# Patient Record
Sex: Female | Born: 1940 | Race: Black or African American | Hispanic: No | Smoking: Former smoker
Health system: Southern US, Community
[De-identification: ages and names within clinical notes are randomized; demographics above are authoritative.]

## PROBLEM LIST (undated history)

## (undated) DIAGNOSIS — I509 Heart failure, unspecified: Secondary | ICD-10-CM

## (undated) DIAGNOSIS — D649 Anemia, unspecified: Secondary | ICD-10-CM

## (undated) DIAGNOSIS — I10 Essential (primary) hypertension: Secondary | ICD-10-CM

## (undated) DIAGNOSIS — N183 Chronic kidney disease, stage 3 unspecified: Secondary | ICD-10-CM

## (undated) DIAGNOSIS — E785 Hyperlipidemia, unspecified: Secondary | ICD-10-CM

## (undated) DIAGNOSIS — I5042 Chronic combined systolic (congestive) and diastolic (congestive) heart failure: Secondary | ICD-10-CM

---

## 2004-05-08 ENCOUNTER — Inpatient Hospital Stay (HOSPITAL_COMMUNITY): Admission: EM | Admit: 2004-05-08 | Discharge: 2004-05-17 | Payer: Self-pay | Admitting: Emergency Medicine

## 2019-02-12 ENCOUNTER — Encounter: Payer: Self-pay | Admitting: Emergency Medicine

## 2019-02-12 ENCOUNTER — Other Ambulatory Visit: Payer: Self-pay

## 2019-02-12 ENCOUNTER — Emergency Department
Admission: EM | Admit: 2019-02-12 | Discharge: 2019-02-12 | Disposition: A | Discharge status: Home / Self Care | Payer: Medicare Other | Attending: Emergency Medicine | Admitting: Emergency Medicine

## 2019-02-12 DIAGNOSIS — Z87891 Personal history of nicotine dependence: Secondary | ICD-10-CM | POA: Insufficient documentation

## 2019-02-12 DIAGNOSIS — I509 Heart failure, unspecified: Secondary | ICD-10-CM | POA: Diagnosis not present

## 2019-02-12 DIAGNOSIS — R220 Localized swelling, mass and lump, head: Secondary | ICD-10-CM | POA: Diagnosis not present

## 2019-02-12 DIAGNOSIS — K0889 Other specified disorders of teeth and supporting structures: Secondary | ICD-10-CM | POA: Diagnosis not present

## 2019-02-12 DIAGNOSIS — I110 Hypertensive heart disease with heart failure: Secondary | ICD-10-CM | POA: Insufficient documentation

## 2019-02-12 HISTORY — DX: Heart failure, unspecified: I50.9

## 2019-02-12 HISTORY — DX: Essential (primary) hypertension: I10

## 2019-02-12 MED ORDER — AMOXICILLIN 500 MG PO CAPS: 500.0000 mg | ORAL_CAPSULE | Freq: Three times a day (TID) | ORAL | 0 refills | Status: DC

## 2019-02-12 MED ORDER — TRAMADOL HCL 50 MG PO TABS
50.0000 mg | ORAL_TABLET | Freq: Once | ORAL | Status: AC
  Administered 2019-02-12: 50 mg via ORAL
  Filled 2019-02-12: qty 1

## 2019-02-12 MED ORDER — TRAMADOL HCL 50 MG PO TABS: 50.0000 mg | ORAL_TABLET | Freq: Four times a day (QID) | ORAL | 0 refills | Status: DC | PRN

## 2019-02-12 NOTE — ED Provider Notes (Signed)
[facility] Emergency Department Provider Note  ____________________________________________   First MD Initiated Contact with Patient 02/12/19 1701     (approximate)  I have reviewed the triage vital signs and the nursing notes.   HISTORY  Chief Complaint Dental Pain and Facial Swelling    HPI Zoe Boyle is a 79 y.o. female presents emergency department complaining of left-sided dental pain and some swelling to her face.  Patient states the pain started 2 days ago.  She has an appointment with her dentist next Monday.  She denies any fever or chills.  She denies chest pain or shortness of breath.  Similar symptoms previously.    Past Medical History:  Diagnosis Date  . Congestive heart failure (CHF) ([place])   . Hypertension     There are no problems to display for this patient.   History reviewed. No pertinent surgical history.  Prior to Admission medications   Medication Sig Start Date End Date Taking? Authorizing Provider  amoxicillin (AMOXIL) 500 MG capsule Take 1 capsule (500 mg total) by mouth 3 (three) times daily. 02/12/19   Areal Cochrane, Linden Dolin, PA-C  traMADol (ULTRAM) 50 MG tablet Take 1 tablet (50 mg total) by mouth every 6 (six) hours as needed. 02/12/19   Versie Starks, PA-C    Allergies Patient has no known allergies.  No family history on file.  Social History Social History   Tobacco Use  . Smoking status: Former Research scientist (life sciences)  . Smokeless tobacco: Never Used  Substance Use Topics  . Alcohol use: Not Currently  . Drug use: Not Currently    Review of Systems  Constitutional: No fever/chills Eyes: No visual changes. ENT: No sore throat.  Positive for dental pain Respiratory: Denies cough Cardiovascular: Denies chest pain Gastrointestinal: Denies abdominal pain Genitourinary: Negative for dysuria. Musculoskeletal: Negative for back pain. Skin: Negative for rash. Psychiatric: no mood changes,      ____________________________________________   PHYSICAL EXAM:  VITAL SIGNS: ED Triage Vitals [02/12/19 1619]  Enc Vitals Group     BP (!) 163/102     Pulse Rate 91     Resp 18     Temp 97.9 F (36.6 C)     Temp Source Oral     SpO2 95 %     Weight 140 lb (63.5 kg)     Height 5\' 4"  (1.626 m)     Head Circumference      Peak Flow      Pain Score 10     Pain Loc      Pain Edu?      Excl. in [place]?     Constitutional: Alert and oriented. Well appearing and in no acute distress. Eyes: Conjunctivae are normal.  Head: Atraumatic. Nose: No congestion/rhinnorhea. Mouth/Throat: Mucous membranes are moist.  Patient has a few teeth left on the left lower jawline, area is tender to palpation, small amount of swelling noted Neck:  supple no lymphadenopathy noted Cardiovascular: Normal rate, regular rhythm. Heart sounds are normal Respiratory: Normal respiratory effort.  No retractions, lungs c t a  GU: deferred Musculoskeletal: FROM all extremities, warm and well perfused Neurologic:  Normal speech and language.  Skin:  Skin is warm, dry and intact. No rash noted. Psychiatric: Mood and affect are normal. Speech and behavior are normal.  ____________________________________________   LABS (all labs ordered are listed, but only abnormal results are displayed)  Labs Reviewed - No data to display ____________________________________________   ____________________________________________  RADIOLOGY  ____________________________________________   PROCEDURES  Procedure(s) performed: No  Procedures    ____________________________________________   INITIAL IMPRESSION / ASSESSMENT AND PLAN / ED COURSE  Pertinent labs & imaging results that were available during my care of the patient were reviewed by me and considered in my medical decision making (see chart for details).   Patient 79 year old female presents emergency department dental pain.  See  HPI  Physical exam shows patient to appear well.  Vitals stable.  Left jaw was tender to palpation and a small amount of swelling is noted.  Remainder the exam is unremarkable  Explained findings to the patient.  Did discuss this with her daughter to ensure we were sending the medication to the right pharmacy.  Patient was given a prescription for amoxicillin and tramadol.  She is to use Tylenol and ibuprofen and if this does not help with pain then she can use tramadol.  She should follow-up with her dentist on Monday.  She is discharged stable condition.  Return if worsening.    Zoe Boyle was evaluated in Emergency Department on 02/12/2019 for the symptoms described in the history of present illness. She was evaluated in the context of the global COVID-19 pandemic, which necessitated consideration that the patient might be at risk for infection with the SARS-CoV-2 virus that causes COVID-19. Institutional protocols and algorithms that pertain to the evaluation of patients at risk for COVID-19 are in a state of rapid change based on information released by regulatory bodies including the CDC and federal and state organizations. These policies and algorithms were followed during the patient's care in the ED.   As part of my medical decision making, I reviewed the following data within the electronic MEDICAL RECORD NUMBER Nursing notes reviewed and incorporated, Old chart reviewed, Notes from prior ED visits and [place] Controlled Substance Database  ____________________________________________   FINAL CLINICAL IMPRESSION(S) / ED DIAGNOSES  Final diagnoses:  Pain, dental      NEW MEDICATIONS STARTED DURING THIS VISIT:  New Prescriptions   AMOXICILLIN (AMOXIL) 500 MG CAPSULE    Take 1 capsule (500 mg total) by mouth 3 (three) times daily.   TRAMADOL (ULTRAM) 50 MG TABLET    Take 1 tablet (50 mg total) by mouth every 6 (six) hours as needed.     Note:  This document was prepared using Dragon voice  recognition software and may include unintentional dictation errors.    Faythe Ghee, PA-C 02/12/19 1721    Sharyn Creamer, MD 02/12/19 2032

## 2019-02-12 NOTE — ED Notes (Signed)
Pt to room 53 c/o left facial pain and swelling. Swelling is not notable on exam. Pt has an appt on Monday with her dentist.

## 2019-02-12 NOTE — ED Triage Notes (Signed)
Pt presents to ED via POV with c/o L sided dental pain with some swelling noted to L side of her face.

## 2019-02-12 NOTE — Discharge Instructions (Addendum)
Follow-up with your regular doctor.   Follow-up with your regular dentist if pain is not better in 2 to 3 days.   Return emergency department if worsening.   Take the amoxicillin 3 times daily.   Tramadol for pain not controlled by Tylenol or ibuprofen.  Be careful with this medication as it may make you drowsy.

## 2019-02-23 ENCOUNTER — Encounter: Payer: Self-pay | Admitting: Emergency Medicine

## 2019-02-23 ENCOUNTER — Emergency Department: Payer: Medicare Other

## 2019-02-23 ENCOUNTER — Emergency Department
Admission: EM | Admit: 2019-02-23 | Discharge: 2019-02-23 | Disposition: A | Discharge status: Home / Self Care | Payer: Medicare Other | Attending: Emergency Medicine | Admitting: Emergency Medicine

## 2019-02-23 ENCOUNTER — Other Ambulatory Visit: Payer: Self-pay

## 2019-02-23 DIAGNOSIS — Z79899 Other long term (current) drug therapy: Secondary | ICD-10-CM | POA: Diagnosis not present

## 2019-02-23 DIAGNOSIS — I110 Hypertensive heart disease with heart failure: Secondary | ICD-10-CM | POA: Insufficient documentation

## 2019-02-23 DIAGNOSIS — Z20822 Contact with and (suspected) exposure to covid-19: Secondary | ICD-10-CM | POA: Diagnosis not present

## 2019-02-23 DIAGNOSIS — I509 Heart failure, unspecified: Secondary | ICD-10-CM | POA: Insufficient documentation

## 2019-02-23 DIAGNOSIS — Z87891 Personal history of nicotine dependence: Secondary | ICD-10-CM | POA: Insufficient documentation

## 2019-02-23 DIAGNOSIS — R438 Other disturbances of smell and taste: Secondary | ICD-10-CM | POA: Diagnosis present

## 2019-02-23 LAB — SARS CORONAVIRUS 2 (TAT 6-24 HRS): SARS Coronavirus 2: NEGATIVE

## 2019-02-23 NOTE — ED Triage Notes (Signed)
Presents with some SOB  Slight cough and decreased taste

## 2019-02-23 NOTE — ED Provider Notes (Signed)
[facility] Emergency Department Provider Note  ____________________________________________   First MD Initiated Contact with Patient 02/23/19 1055     (approximate)  I have reviewed the triage vital signs and the nursing notes.   HISTORY  Chief Complaint No chief complaint on file.    HPI Zoe Boyle is a 79 y.o. female presents emergency department with her daughter.  Patient presents  to the emergency department with her daughter.  Patient has had loss of taste for several days.  Daughter works at the post [employer] and has had Covid-like symptoms.  Patient denies fever or chills.  She denies chest pain or shortness of breath to me.  She states she has not had a cough when I asked her.  She does appear to be well.  Daughter is in the room to help answer questions to.   Past Medical History:  Diagnosis Date  . Congestive heart failure (CHF) ([place])   . Hypertension     There are no problems to display for this patient.   History reviewed. No pertinent surgical history.  Prior to Admission medications   Medication Sig Start Date End Date Taking? Authorizing Provider  furosemide (LASIX) 40 MG tablet Take 40 mg by mouth.   Yes [provider]  lisinopril (ZESTRIL) 40 MG tablet Take 40 mg by mouth daily.   Yes [provider]  pravastatin (PRAVACHOL) 40 MG tablet Take 40 mg by mouth daily.   Yes [provider]  amoxicillin (AMOXIL) 500 MG capsule Take 1 capsule (500 mg total) by mouth 3 (three) times daily. 02/12/19   Cythina Mickelsen, Linden Dolin, PA-C  traMADol (ULTRAM) 50 MG tablet Take 1 tablet (50 mg total) by mouth every 6 (six) hours as needed. 02/12/19   Versie Starks, PA-C    Allergies Patient has no known allergies.  No family history on file.  Social History Social History   Tobacco Use  . Smoking status: Former Research scientist (life sciences)  . Smokeless tobacco: Never Used  Substance Use Topics  . Alcohol use: Not Currently  . Drug use: Not  Currently    Review of Systems  Constitutional: No fever/chills Eyes: No visual changes. ENT: No sore throat.  Loss of taste Respiratory: Denies cough Cardiovascular: Denies chest pain Gastrointestinal: Denies abdominal pain Genitourinary: Negative for dysuria. Musculoskeletal: Negative for back pain. Skin: Negative for rash. Psychiatric: no mood changes,     ____________________________________________   PHYSICAL EXAM:  VITAL SIGNS: ED Triage Vitals [02/23/19 1101]  Enc Vitals Group     BP 129/89     Pulse Rate 72     Resp 18     Temp 98.3 F (36.8 C)     Temp Source Oral     SpO2 100 %     Weight      Height      Head Circumference      Peak Flow      Pain Score      Pain Loc      Pain Edu?      Excl. in [place]?     Constitutional: Alert and oriented. Well appearing and in no acute distress. Eyes: Conjunctivae are normal.  Head: Atraumatic. Nose: No congestion/rhinnorhea. Cardiovascular: Normal rate, regular rhythm. Heart sounds are normal Respiratory: Normal respiratory effort.  No retractions, lungs c t a  GU: deferred Musculoskeletal: FROM all extremities, warm and well perfused Neurologic:  Normal speech and language.  Skin:  Skin is warm, dry and intact. No  rash noted. Psychiatric: Mood and affect are normal. Speech and behavior are normal.  ____________________________________________   LABS (all labs ordered are listed, but only abnormal results are displayed)  Labs Reviewed  SARS CORONAVIRUS 2 (TAT 6-24 HRS)   ____________________________________________   ____________________________________________  RADIOLOGY  Chest x-ray is normal  ____________________________________________   PROCEDURES  Procedure(s) performed: No  Procedures    ____________________________________________   INITIAL IMPRESSION / ASSESSMENT AND PLAN / ED COURSE  Pertinent labs & imaging results that were available during my care of the patient were  reviewed by me and considered in my medical decision making (see chart for details).   The patient 79 year old female presents emergency department with decreased taste and cough with some shortness of breath.  See HPI  Physical exam patient appears to be well.  She is recently had 2 teeth pulled today.  Packing is in place.  Remainder exams are unremarkable  Chest x-ray is normal Covid test is pending  Explained findings to the daughter and the patient.  Covid test should result in 6- 24 hours.  If positive they should follow-up with her regular doctor as needed.  Return emergency department worsening.  She was discharged stable condition.    Zoe Boyle was evaluated in Emergency Department on 02/23/2019 for the symptoms described in the history of present illness. She was evaluated in the context of the global COVID-19 pandemic, which necessitated consideration that the patient might be at risk for infection with the SARS-CoV-2 virus that causes COVID-19. Institutional protocols and algorithms that pertain to the evaluation of patients at risk for COVID-19 are in a state of rapid change based on information released by regulatory bodies including the CDC and federal and state organizations. These policies and algorithms were followed during the patient's care in the ED.   As part of my medical decision making, I reviewed the following data within the electronic MEDICAL RECORD NUMBER History obtained from family, Nursing notes reviewed and incorporated, Old chart reviewed, Radiograph reviewed chest x-ray is normal, Notes from prior ED visits and [place] Controlled Substance Database  ____________________________________________   FINAL CLINICAL IMPRESSION(S) / ED DIAGNOSES  Final diagnoses:  Suspected COVID-19 virus infection      NEW MEDICATIONS STARTED DURING THIS VISIT:  New Prescriptions   No medications on file     Note:  This document was prepared using Dragon voice recognition  software and may include unintentional dictation errors.    Faythe Ghee, PA-C 02/23/19 1611    Shaune Pollack, MD 02/24/19 [phone]

## 2019-02-23 NOTE — Discharge Instructions (Addendum)
Follow-up with your regular doctor if not better in 3 days.  Return emergency department if worsening.  If you begin to have chest pain or shortness of breath please return immediately.

## 2019-10-18 ENCOUNTER — Emergency Department: Payer: Medicare Other

## 2019-10-18 ENCOUNTER — Emergency Department
Admission: EM | Admit: 2019-10-18 | Discharge: 2019-10-18 | Disposition: A | Discharge status: Home / Self Care | Payer: Medicare Other | Attending: Emergency Medicine | Admitting: Emergency Medicine

## 2019-10-18 ENCOUNTER — Encounter: Payer: Self-pay | Admitting: Emergency Medicine

## 2019-10-18 ENCOUNTER — Other Ambulatory Visit: Payer: Self-pay

## 2019-10-18 DIAGNOSIS — Z20822 Contact with and (suspected) exposure to covid-19: Secondary | ICD-10-CM | POA: Diagnosis not present

## 2019-10-18 DIAGNOSIS — I110 Hypertensive heart disease with heart failure: Secondary | ICD-10-CM | POA: Insufficient documentation

## 2019-10-18 DIAGNOSIS — Z79899 Other long term (current) drug therapy: Secondary | ICD-10-CM | POA: Insufficient documentation

## 2019-10-18 DIAGNOSIS — R0602 Shortness of breath: Secondary | ICD-10-CM | POA: Diagnosis not present

## 2019-10-18 DIAGNOSIS — I5033 Acute on chronic diastolic (congestive) heart failure: Secondary | ICD-10-CM | POA: Diagnosis not present

## 2019-10-18 DIAGNOSIS — Z87891 Personal history of nicotine dependence: Secondary | ICD-10-CM | POA: Insufficient documentation

## 2019-10-18 DIAGNOSIS — R0601 Orthopnea: Secondary | ICD-10-CM | POA: Diagnosis not present

## 2019-10-18 LAB — BASIC METABOLIC PANEL
Anion gap: 12 (ref 5–15)
BUN: 13 mg/dL (ref 8–23)
CO2: 23 mmol/L (ref 22–32)
Calcium: 9 mg/dL (ref 8.9–10.3)
Chloride: 107 mmol/L (ref 98–111)
Creatinine, Ser: 0.97 mg/dL (ref 0.44–1.00)
GFR calc Af Amer: >60 mL/min (ref >60)
GFR calc non Af Amer: 56 mL/min — ABNORMAL LOW (ref >60)
Glucose, Bld: 126 mg/dL — ABNORMAL HIGH (ref 70–99)
Potassium: 2.9 mmol/L — ABNORMAL LOW (ref 3.5–5.1)
Sodium: 142 mmol/L (ref 135–145)

## 2019-10-18 LAB — TROPONIN I (HIGH SENSITIVITY)
Troponin I (High Sensitivity): 20 ng/L — ABNORMAL HIGH (ref <18)
Troponin I (High Sensitivity): 24 ng/L — ABNORMAL HIGH (ref <18)

## 2019-10-18 LAB — CBC
HCT: 32.3 % — ABNORMAL LOW (ref 36.0–46.0)
Hemoglobin: 10.5 g/dL — ABNORMAL LOW (ref 12.0–15.0)
MCH: 27.6 pg (ref 26.0–34.0)
MCHC: 32.5 g/dL (ref 30.0–36.0)
MCV: 84.8 fL (ref 80.0–100.0)
Platelets: 181 10*3/uL (ref 150–400)
RBC: 3.81 MIL/uL — ABNORMAL LOW (ref 3.87–5.11)
RDW: 14.7 % (ref 11.5–15.5)
WBC: 4.9 10*3/uL (ref 4.0–10.5)
nRBC: 0 % (ref 0.0–0.2)

## 2019-10-18 LAB — BRAIN NATRIURETIC PEPTIDE: B Natriuretic Peptide: 734 pg/mL — ABNORMAL HIGH (ref 0.0–100.0)

## 2019-10-18 LAB — SARS CORONAVIRUS 2 BY RT PCR (HOSPITAL ORDER, PERFORMED IN ~~LOC~~ HOSPITAL LAB): SARS Coronavirus 2: NEGATIVE

## 2019-10-18 MED ORDER — FUROSEMIDE 10 MG/ML IJ SOLN
40.0000 mg | Freq: Once | INTRAMUSCULAR | Status: AC
  Administered 2019-10-18: 40 mg via INTRAVENOUS
  Filled 2019-10-18: qty 4

## 2019-10-18 MED ORDER — POTASSIUM CHLORIDE 20 MEQ PO PACK
40.0000 meq | PACK | Freq: Once | ORAL | Status: AC
  Administered 2019-10-18: 40 meq via ORAL
  Filled 2019-10-18: qty 2

## 2019-10-18 NOTE — ED Triage Notes (Signed)
EKG from 2016 at [facility] read LBBB

## 2019-10-18 NOTE — ED Provider Notes (Signed)
[facility] Emergency Department Provider Note ____________________________________________   First MD Initiated Contact with Patient 10/18/19 1928     (approximate)  I have reviewed the triage vital signs and the nursing notes.  HISTORY  Chief Complaint Shortness of Breath   HPI Zoe Boyle is a 79 y.o. femalewho presents to the ED for evaluation of shortness of breath.   Chart review indicates hx CHF and HTN.  Patient prescribed Lasix 40 mg daily.  Patient lives at home with daughter, who helps manage her medications.  Patient presents to the ED with her daughter due to concerns for 1-2 days of shortness of breath. Daughter reports they had run out of patient's Lasix and potassium supplementation last week, and patient went about 5 days without his medications.  Daughter reports refilling his medications over this weekend and have them of the past 2 days, and dosing them as prescribed.  Daughter reports concomitant concern for shortness of breath, and "just wanted her to get checked out."  Patient is not vaccinated for COVID-19, and daughter reports concern for this.  Further reports concern for pneumonia or other causes of shortness of breath.  Patient has no complaints.  She reports feeling well and has no chest pain.  She reports orthopnea "sometimes" and reports that it may be a little bit worse these past few days compared to normal.  Patient denies increased cough, productive cough, fevers, syncope or pain.  Past Medical History:  Diagnosis Date  . Congestive heart failure (CHF) ([facility])   . Hypertension     There are no problems to display for this patient.   History reviewed. No pertinent surgical history.  Prior to Admission medications   Medication Sig Start Date End Date Taking? Authorizing Provider  amoxicillin (AMOXIL) 500 MG capsule Take 1 capsule (500 mg total) by mouth 3 (three) times daily. 02/12/19   Fisher, Roselyn Bering, PA-C  furosemide  (LASIX) 40 MG tablet Take 40 mg by mouth.    [provider]  lisinopril (ZESTRIL) 40 MG tablet Take 40 mg by mouth daily.    [provider]  pravastatin (PRAVACHOL) 40 MG tablet Take 40 mg by mouth daily.    [provider]  traMADol (ULTRAM) 50 MG tablet Take 1 tablet (50 mg total) by mouth every 6 (six) hours as needed. 02/12/19   Faythe Ghee, PA-C    Allergies Patient has no known allergies.  History reviewed. No pertinent family history.  Social History Social History   Tobacco Use  . Smoking status: Former Games developer  . Smokeless tobacco: Never Used  Substance Use Topics  . Alcohol use: Not Currently  . Drug use: Not Currently    Review of Systems  Constitutional: No fever/chills Eyes: No visual changes. ENT: No sore throat. Cardiovascular: Denies chest pain. Respiratory: Positive shortness of breath. Gastrointestinal: No abdominal pain.  No nausea, no vomiting.  No diarrhea.  No constipation. Genitourinary: Negative for dysuria. Musculoskeletal: Negative for back pain. Skin: Negative for rash. Neurological: Negative for headaches, focal weakness or numbness.   ____________________________________________   PHYSICAL EXAM:  VITAL SIGNS: Vitals:   10/18/19 2015 10/18/19 2030  BP:  (!) 135/106  Pulse:  88  Resp: 20   Temp:    SpO2: 93%       Constitutional: Alert and oriented. Well appearing and in no acute distress. Eyes: Conjunctivae are normal. PERRL. EOMI. Head: Atraumatic. Nose: No congestion/rhinnorhea. Mouth/Throat: Mucous membranes are moist.  Oropharynx non-erythematous. Neck: No  stridor. No cervical spine tenderness to palpation. Cardiovascular: Normal rate, regular rhythm. Grossly normal heart sounds.  Good peripheral circulation. Respiratory: Normal respiratory effort.  No retractions. Lungs CTAB. Gastrointestinal: Soft , nondistended, nontender to palpation. No abdominal bruits. No CVA tenderness. Musculoskeletal:  No lower extremity tenderness nor edema.  No joint effusions. No signs of acute trauma. Neurologic:  Normal speech and language. No gross focal neurologic deficits are appreciated. No gait instability noted. Skin:  Skin is warm, dry and intact. No rash noted. Psychiatric: Mood and affect are normal. Speech and behavior are normal.  ____________________________________________   LABS (all labs ordered are listed, but only abnormal results are displayed)  Labs Reviewed  BASIC METABOLIC PANEL - Abnormal; Notable for the following components:      Result Value   Potassium 2.9 (*)    Glucose, Bld 126 (*)    GFR calc non Af Amer 56 (*)    All other components within normal limits  CBC - Abnormal; Notable for the following components:   RBC 3.81 (*)    Hemoglobin 10.5 (*)    HCT 32.3 (*)    All other components within normal limits  BRAIN NATRIURETIC PEPTIDE - Abnormal; Notable for the following components:   B Natriuretic Peptide 734.0 (*)    All other components within normal limits  TROPONIN I (HIGH SENSITIVITY) - Abnormal; Notable for the following components:   Troponin I (High Sensitivity) 20 (*)    All other components within normal limits  TROPONIN I (HIGH SENSITIVITY) - Abnormal; Notable for the following components:   Troponin I (High Sensitivity) 24 (*)    All other components within normal limits  SARS CORONAVIRUS 2 BY RT PCR (HOSPITAL ORDER, PERFORMED IN [facility])   ____________________________________________  12 Lead EKG  Sinus rhythm, rate of 81 bpm, left axis.  Left bundle branch block at baseline.  No evidence of ischemia, per Sgarbossa criteria ____________________________________________  RADIOLOGY  ED MD interpretation: 2 view CXR reviewed with evidence of mild volume overload.  No discrete filtration or diffuse infiltrates.  Official radiology report(s): DG Chest 2 View  Result Date: 10/18/2019 CLINICAL DATA:  Worsening dyspnea for 4 days,  CHF EXAM: CHEST - 2 VIEW COMPARISON:  02/23/2019 chest radiograph. FINDINGS: Stable cardiomediastinal silhouette with moderate cardiomegaly. No pneumothorax. Trace bilateral pleural effusions. Mild pulmonary edema. IMPRESSION: Mild congestive heart failure with trace bilateral pleural effusions. Electronically Signed   By: Delbert Phenix M.D.   On: 10/18/2019 13:58    ____________________________________________   PROCEDURES and INTERVENTIONS  Procedure(s) performed (including Critical Care):  Procedures  Medications  potassium chloride (KLOR-CON) packet 40 mEq (40 mEq Oral Given 10/18/19 2012)  furosemide (LASIX) injection 40 mg (40 mg Intravenous Given 10/18/19 2012)    ____________________________________________   MDM / ED COURSE  79 year old woman with known CHF presenting with evidence of mild CHF exacerbation after accidentally running out of medications at home for few days, amenable to outpatient management.  Normal vital signs on room air.  Exam is reassuring without evidence of gross volume overload.  No evidence of distress, trauma or neurovascular deficits.  Patient really looks well and she has no complaints.  She is sitting upright in bed, and vaguely reports "sometimes" orthopnea.  Her work-up is most consistent with volume overload and mild CHF exacerbation with elevated BNP and congested CXR.  EKG is nonischemic and troponin is negative.  Blood work further demonstrates hypokalemia, likely due to her lack of potassium supplementation as  well.  Potassium was repleted orally and she was provided a single dose of IV Lasix with good subsequent urinary output.  She reports subsequent improved orthopnea and symptoms.  Patient looks well and has no indications for hospitalization.  Daughter reports now having the appropriate medications at home with her Lasix and potassium supplementation, and agrees to continue these medications as prescribed starting tomorrow.  I believe her single  dose of Lasix IV here helped bring her back closer to euvolemia.  We discussed return precautions for the ED and following up with PCP and cardiology.  Patient medically stable for discharge home.  Clinical Course as of Oct 17 2153  Wynelle Link Oct 18, 2019  2151 Reassessed.  Patient reports feeling well.  Denies shortness of breath or orthopnea now.  Educated patient and daughter at the bedside about signs of CHF exacerbation, likely accidental due to her lack of medications for a few days.  We discussed outpatient measures of her continued potassium supplementation and Lasix usage at home.  Answered questions.   [DS]    Clinical Course User Index [DS] Delton Prairie, MD     ____________________________________________   FINAL CLINICAL IMPRESSION(S) / ED DIAGNOSES  Final diagnoses:  SOB (shortness of breath)  Orthopnea  Acute on chronic diastolic congestive heart failure [facility])     ED Discharge Orders    None       Pailyn [place]   Note:  This document was prepared using Dragon voice recognition software and may include unintentional dictation errors.   Delton Prairie, MD 10/18/19 2158

## 2019-10-18 NOTE — ED Notes (Signed)
Pt ambulatory to toilet

## 2019-10-18 NOTE — Discharge Instructions (Signed)
You were seen in the ED because of Zoe Boyle's shortness of breath at home.  She has evidence of a mild heart failure exacerbation, likely due to her going a few days without medications.  Thankfully, there is no evidence of Covid, pneumonia or damage/strain to her heart.  It is safe to go home and continue her typical medication regimen.  Please continue to give her potassium supplementation and furosemide fluid pill as prescribed.  Follow-up with her cardiologist and PCP as scheduled.  If she develops any further worsening shortness of breath, please return to the ED.

## 2019-10-18 NOTE — ED Triage Notes (Signed)
Pt here for [facility] per daughter.  Daughter feels breathing has been heavier past couple days. Pt unsure about weight gain, does not weigh self.  Unlabored at this time, VSS.  No fever or cough.

## 2019-12-30 ENCOUNTER — Encounter: Payer: Self-pay | Admitting: Emergency Medicine

## 2019-12-30 ENCOUNTER — Other Ambulatory Visit: Payer: Self-pay

## 2019-12-30 ENCOUNTER — Emergency Department: Payer: Medicare Other

## 2019-12-30 DIAGNOSIS — I509 Heart failure, unspecified: Secondary | ICD-10-CM | POA: Insufficient documentation

## 2019-12-30 DIAGNOSIS — Z5321 Procedure and treatment not carried out due to patient leaving prior to being seen by health care provider: Secondary | ICD-10-CM | POA: Insufficient documentation

## 2019-12-30 DIAGNOSIS — J189 Pneumonia, unspecified organism: Secondary | ICD-10-CM | POA: Diagnosis not present

## 2019-12-30 DIAGNOSIS — I110 Hypertensive heart disease with heart failure: Secondary | ICD-10-CM | POA: Insufficient documentation

## 2019-12-30 DIAGNOSIS — R059 Cough, unspecified: Secondary | ICD-10-CM | POA: Insufficient documentation

## 2019-12-30 DIAGNOSIS — R0602 Shortness of breath: Secondary | ICD-10-CM | POA: Insufficient documentation

## 2019-12-30 DIAGNOSIS — K0889 Other specified disorders of teeth and supporting structures: Secondary | ICD-10-CM | POA: Insufficient documentation

## 2019-12-30 DIAGNOSIS — R197 Diarrhea, unspecified: Secondary | ICD-10-CM | POA: Insufficient documentation

## 2019-12-30 LAB — CBC
HCT: 28.2 % — ABNORMAL LOW (ref 36.0–46.0)
Hemoglobin: 9.3 g/dL — ABNORMAL LOW (ref 12.0–15.0)
MCH: 26 pg (ref 26.0–34.0)
MCHC: 33 g/dL (ref 30.0–36.0)
MCV: 78.8 fL — ABNORMAL LOW (ref 80.0–100.0)
Platelets: 212 10*3/uL (ref 150–400)
RBC: 3.58 MIL/uL — ABNORMAL LOW (ref 3.87–5.11)
RDW: 13.8 % (ref 11.5–15.5)
WBC: 11.7 10*3/uL — ABNORMAL HIGH (ref 4.0–10.5)
nRBC: 0 % (ref 0.0–0.2)

## 2019-12-30 LAB — BASIC METABOLIC PANEL
Anion gap: 14 (ref 5–15)
BUN: 43 mg/dL — ABNORMAL HIGH (ref 8–23)
CO2: 22 mmol/L (ref 22–32)
Calcium: 9.5 mg/dL (ref 8.9–10.3)
Chloride: 102 mmol/L (ref 98–111)
Creatinine, Ser: 2.63 mg/dL — ABNORMAL HIGH (ref 0.44–1.00)
GFR, Estimated: 18 mL/min — ABNORMAL LOW (ref >60)
Glucose, Bld: 146 mg/dL — ABNORMAL HIGH (ref 70–99)
Potassium: 3.6 mmol/L (ref 3.5–5.1)
Sodium: 138 mmol/L (ref 135–145)

## 2019-12-30 LAB — TROPONIN I (HIGH SENSITIVITY): Troponin I (High Sensitivity): 47 ng/L — ABNORMAL HIGH (ref <18)

## 2019-12-30 NOTE — ED Triage Notes (Signed)
Pt to ED from home c/o SOB, productive yellow cough, and dental pain.  Cough approx 1 week.  Pt also c/o dental pain causing her to not speak or eat at home.  Pt daughter with pt answering questions.  Daughter states noticed diarrhea at home today.  Hx of HTN and CHF.  Pt A&O, skin WNL, chest rise even and unlabored, in NAD at this time.

## 2019-12-31 ENCOUNTER — Emergency Department: Payer: Medicare Other

## 2019-12-31 ENCOUNTER — Inpatient Hospital Stay
Admission: EM | Admit: 2019-12-31 | Discharge: 2020-01-02 | DRG: 193 | Disposition: A | Discharge status: Home / Self Care | Payer: Medicare Other | Attending: Internal Medicine | Admitting: Internal Medicine

## 2019-12-31 ENCOUNTER — Emergency Department
Admission: EM | Admit: 2019-12-31 | Discharge: 2019-12-31 | Disposition: A | Discharge status: Home / Self Care | Payer: Medicare Other | Source: Home / Self Care

## 2019-12-31 ENCOUNTER — Other Ambulatory Visit: Payer: Self-pay

## 2019-12-31 DIAGNOSIS — N179 Acute kidney failure, unspecified: Secondary | ICD-10-CM | POA: Diagnosis present

## 2019-12-31 DIAGNOSIS — K029 Dental caries, unspecified: Secondary | ICD-10-CM | POA: Diagnosis present

## 2019-12-31 DIAGNOSIS — Z888 Allergy status to other drugs, medicaments and biological substances status: Secondary | ICD-10-CM

## 2019-12-31 DIAGNOSIS — I700 Atherosclerosis of aorta: Secondary | ICD-10-CM | POA: Diagnosis present

## 2019-12-31 DIAGNOSIS — Z6820 Body mass index (BMI) 20.0-20.9, adult: Secondary | ICD-10-CM | POA: Diagnosis not present

## 2019-12-31 DIAGNOSIS — E440 Moderate protein-calorie malnutrition: Secondary | ICD-10-CM | POA: Insufficient documentation

## 2019-12-31 DIAGNOSIS — R0602 Shortness of breath: Secondary | ICD-10-CM | POA: Diagnosis present

## 2019-12-31 DIAGNOSIS — K0889 Other specified disorders of teeth and supporting structures: Secondary | ICD-10-CM | POA: Diagnosis present

## 2019-12-31 DIAGNOSIS — Z20822 Contact with and (suspected) exposure to covid-19: Secondary | ICD-10-CM | POA: Diagnosis present

## 2019-12-31 DIAGNOSIS — I110 Hypertensive heart disease with heart failure: Secondary | ICD-10-CM | POA: Diagnosis present

## 2019-12-31 DIAGNOSIS — I447 Left bundle-branch block, unspecified: Secondary | ICD-10-CM | POA: Diagnosis present

## 2019-12-31 DIAGNOSIS — E43 Unspecified severe protein-calorie malnutrition: Secondary | ICD-10-CM | POA: Insufficient documentation

## 2019-12-31 DIAGNOSIS — J189 Pneumonia, unspecified organism: Principal | ICD-10-CM | POA: Diagnosis present

## 2019-12-31 DIAGNOSIS — Z79899 Other long term (current) drug therapy: Secondary | ICD-10-CM | POA: Diagnosis not present

## 2019-12-31 DIAGNOSIS — I5022 Chronic systolic (congestive) heart failure: Secondary | ICD-10-CM | POA: Diagnosis present

## 2019-12-31 DIAGNOSIS — Z87891 Personal history of nicotine dependence: Secondary | ICD-10-CM

## 2019-12-31 LAB — RESP PANEL BY RT-PCR (FLU A&B, COVID) ARPGX2
Influenza A by PCR: NEGATIVE
Influenza B by PCR: NEGATIVE
SARS Coronavirus 2 by RT PCR: NEGATIVE

## 2019-12-31 LAB — BASIC METABOLIC PANEL
Anion gap: 15 (ref 5–15)
BUN: 47 mg/dL — ABNORMAL HIGH (ref 8–23)
CO2: 23 mmol/L (ref 22–32)
Calcium: 9.9 mg/dL (ref 8.9–10.3)
Chloride: 101 mmol/L (ref 98–111)
Creatinine, Ser: 1.92 mg/dL — ABNORMAL HIGH (ref 0.44–1.00)
GFR, Estimated: 27 mL/min — ABNORMAL LOW (ref >60)
Glucose, Bld: 127 mg/dL — ABNORMAL HIGH (ref 70–99)
Potassium: 3.9 mmol/L (ref 3.5–5.1)
Sodium: 139 mmol/L (ref 135–145)

## 2019-12-31 LAB — CBC
HCT: 27.5 % — ABNORMAL LOW (ref 36.0–46.0)
Hemoglobin: 9.4 g/dL — ABNORMAL LOW (ref 12.0–15.0)
MCH: 26.6 pg (ref 26.0–34.0)
MCHC: 34.2 g/dL (ref 30.0–36.0)
MCV: 77.9 fL — ABNORMAL LOW (ref 80.0–100.0)
Platelets: 230 10*3/uL (ref 150–400)
RBC: 3.53 MIL/uL — ABNORMAL LOW (ref 3.87–5.11)
RDW: 14.1 % (ref 11.5–15.5)
WBC: 12.1 10*3/uL — ABNORMAL HIGH (ref 4.0–10.5)
nRBC: 0 % (ref 0.0–0.2)

## 2019-12-31 LAB — LACTIC ACID, PLASMA: Lactic Acid, Venous: 1.2 mmol/L (ref 0.5–1.9)

## 2019-12-31 MED ORDER — TRAMADOL HCL 50 MG PO TABS: 50.0000 mg | ORAL_TABLET | Freq: Four times a day (QID) | ORAL | Status: DC | PRN

## 2019-12-31 MED ORDER — ACETAMINOPHEN 160 MG/5ML PO SOLN
650.0000 mg | Freq: Four times a day (QID) | ORAL | Status: DC | PRN
  Filled 2019-12-31 (×2): qty 20.3

## 2019-12-31 MED ORDER — PRAVASTATIN SODIUM 20 MG PO TABS
40.0000 mg | ORAL_TABLET | Freq: Every day | ORAL | Status: DC
  Administered 2020-01-01 – 2020-01-02 (×2): 40 mg via ORAL
  Filled 2019-12-31: qty 1
  Filled 2019-12-31 (×2): qty 2
  Filled 2019-12-31: qty 1

## 2019-12-31 MED ORDER — SODIUM CHLORIDE 0.9 % IV SOLN
500.0000 mg | INTRAVENOUS | Status: DC
  Administered 2020-01-01: 500 mg via INTRAVENOUS
  Filled 2019-12-31 (×2): qty 500

## 2019-12-31 MED ORDER — SODIUM CHLORIDE 0.9 % IV SOLN: INTRAVENOUS | Status: DC

## 2019-12-31 MED ORDER — SODIUM CHLORIDE 0.9 % IV SOLN
500.0000 mg | INTRAVENOUS | Status: DC
  Administered 2019-12-31: 500 mg via INTRAVENOUS
  Filled 2019-12-31 (×2): qty 500

## 2019-12-31 MED ORDER — SODIUM CHLORIDE 0.9 % IV SOLN
2.0000 g | INTRAVENOUS | Status: DC
  Filled 2019-12-31: qty 20

## 2019-12-31 MED ORDER — SODIUM CHLORIDE 0.9 % IV SOLN
2.0000 g | INTRAVENOUS | Status: DC
  Administered 2019-12-31: 2 g via INTRAVENOUS
  Filled 2019-12-31: qty 20

## 2019-12-31 MED ORDER — ENOXAPARIN SODIUM 30 MG/0.3ML ~~LOC~~ SOLN
30.0000 mg | SUBCUTANEOUS | Status: DC
  Administered 2020-01-01 (×2): 30 mg via SUBCUTANEOUS
  Filled 2019-12-31 (×2): qty 0.3

## 2019-12-31 MED ORDER — SODIUM CHLORIDE 0.9 % IV SOLN: Freq: Once | INTRAVENOUS | Status: AC

## 2019-12-31 NOTE — ED Triage Notes (Addendum)
Pt comes via POV from home with c/o 2 week dental pain. Pt has bad tooth. Pt's family states possible abscess.  Pt also has bad cold. Pt states productive green phelgm.  Pt states increased SOB.

## 2019-12-31 NOTE — H&P (Signed)
History and Physical    Zoe Boyle WUJ:[id number] DOB: Nov 11, 1940 DOA: 12/31/2019  PCP: [facility]   Patient coming from: Home  I have personally briefly reviewed patient's old medical records in [facility]  Chief Complaint: Shortness of breath                                Dental pain  HPI: Zoe Boyle is a 79 y.o. female with medical history significant for chronic systolic heart failure with last known LVEF of about 20% from an echocardiogram done in 2016, history of hypertension who was brought into the emergency room by her daughter for evaluation of worsening cough productive of yellowish-green phlegm, right-sided facial swelling and increased drooling. Patient was brought to the ER one day prior to her admission by her daughter but left without being seen.  Patient has had dental pain for 2 weeks and has been unable to eat or drink due to the pain.  Her daughter states that they have been forcing her to take an Ensure daily.  She has made an appointment to take the patient to the dentist but noticed over the last couple of days that her mother now has swelling involving the right side of her face and is also more short of breath than at her baseline. Patient denies having any fever or chills, denies having any difficulty swallowing but states that she is unable to chew due to pain, has no palpitations, no diaphoresis, no nausea, no vomiting, no urinary symptoms or any changes in her bowel habits. Labs show sodium 139, potassium 3.9, chloride 101, bicarb 23, glucose 127, BUN 47, creatinine 1.92 above her baseline of 0.97, calcium 9.9, lactic acid 1.2, white count 12.1, hemoglobin 9.4, hematocrit 27.5, MCV 77.9, RDW 14.9, platelet count 230 Respiratory viral panel is negative Chest x-ray reviewed by me shows  patchy airspace disease in the right mid lung and right lung base is worrisome for pneumonia. The patient also appears to have a small right pleural effusion. No change  in marked enlargement of the cardiopericardial silhouette compatible with cardiomegaly and or pericardial effusion. Twelve-lead EKG shows normal sinus rhythm with a left bundle branch block    ED Course: Patient is a 79 year old African-American female with a history of chronic systolic heart failure who was brought into the ER by her daughter for evaluation of shortness of breath, right-sided facial swelling, increased drooling and worsening cough productive of yellowish-green phlegm.  Patient is noted to have a right mid/basilar pneumonia concerning for possible aspiration.  She will be admitted to the hospital for further evaluation.  Review of Systems: As per HPI otherwise 10 point review of systems negative.    Past Medical History:  Diagnosis Date  . Congestive heart failure (CHF) (HCC)   . Hypertension     History reviewed. No pertinent surgical history.   reports that she has quit smoking. She has never used smokeless tobacco. She reports previous alcohol use. She reports previous drug use.  No Known Allergies  Family History  Family history unknown: Yes     Prior to Admission medications   Medication Sig Start Date End Date Taking? Authorizing Provider  amoxicillin (AMOXIL) 500 MG capsule Take 1 capsule (500 mg total) by mouth 3 (three) times daily. 02/12/19   Fisher, Roselyn Bering, PA-C  furosemide (LASIX) 40 MG tablet Take 40 mg by mouth.    [provider]  lisinopril (ZESTRIL) 40 MG tablet Take 40 mg by mouth daily.    [provider]  pravastatin (PRAVACHOL) 40 MG tablet Take 40 mg by mouth daily.    [provider]  traMADol (ULTRAM) 50 MG tablet Take 1 tablet (50 mg total) by mouth every 6 (six) hours as needed. 02/12/19   Faythe Ghee, PA-C    Physical Exam: Vitals:   12/31/19 1526 12/31/19 1527 12/31/19 2100 12/31/19 2123  BP:  116/70  120/86  Pulse:  87 99 100  Resp:  18  20  Temp:  99.1 F (37.3 C)    SpO2:  96% 93% 94%  Weight:  56.7 kg     Height: 5\' 4"  (1.626 m)        Vitals:   12/31/19 1526 12/31/19 1527 12/31/19 2100 12/31/19 2123  BP:  116/70  120/86  Pulse:  87 99 100  Resp:  18  20  Temp:  99.1 F (37.3 C)    SpO2:  96% 93% 94%  Weight: 56.7 kg     Height: 5\' 4"  (1.626 m)       Constitutional: NAD, alert and oriented x 3.  Chronically ill-appearing, right-sided facial swelling Eyes: PERRL, lids and conjunctivae normal ENMT: Swelling involving the right cheek, drooling from the right side Neck: normal, supple, no masses, no thyromegaly Respiratory: Rhonchi over the right mid to lower lung zones, no wheezing, no crackles. Normal respiratory effort. No accessory muscle use.  Cardiovascular: Regular rate and rhythm, no murmurs / rubs / gallops. No extremity edema. 2+ pedal pulses. No carotid bruits.  Abdomen: no tenderness, no masses palpated. No hepatosplenomegaly. Bowel sounds positive.  Musculoskeletal: no clubbing / cyanosis. No joint deformity upper and lower extremities.  Skin: no rashes, lesions, ulcers.  Neurologic: No gross focal neurologic deficit.  Generalized weakness Psychiatric: Normal mood and affect.   Labs on Admission: I have personally reviewed following labs and imaging studies  CBC: Recent Labs  Lab 12/30/19 2223 12/31/19 1534  WBC 11.7* 12.1*  HGB 9.3* 9.4*  HCT 28.2* 27.5*  MCV 78.8* 77.9*  PLT 212 230   Basic Metabolic Panel: Recent Labs  Lab 12/30/19 2223 12/31/19 1534  NA 138 139  K 3.6 3.9  CL 102 101  CO2 22 23  GLUCOSE 146* 127*  BUN 43* 47*  CREATININE 2.63* 1.92*  CALCIUM 9.5 9.9   GFR: Estimated Creatinine Clearance: 21.2 mL/min (A) (by C-G formula based on SCr of 1.92 mg/dL (H)). Liver Function Tests: No results for input(s): AST, ALT, ALKPHOS, BILITOT, PROT, ALBUMIN in the last 168 hours. No results for input(s): LIPASE, AMYLASE in the last 168 hours. No results for input(s): AMMONIA in the last 168 hours. Coagulation Profile: No results  for input(s): INR, PROTIME in the last 168 hours. Cardiac Enzymes: No results for input(s): CKTOTAL, CKMB, CKMBINDEX, TROPONINI in the last 168 hours. BNP (last 3 results) No results for input(s): PROBNP in the last 8760 hours. HbA1C: No results for input(s): HGBA1C in the last 72 hours. CBG: No results for input(s): GLUCAP in the last 168 hours. Lipid Profile: No results for input(s): CHOL, HDL, LDLCALC, TRIG, CHOLHDL, LDLDIRECT in the last 72 hours. Thyroid Function Tests: No results for input(s): TSH, T4TOTAL, FREET4, T3FREE, THYROIDAB in the last 72 hours. Anemia Panel: No results for input(s): VITAMINB12, FOLATE, FERRITIN, TIBC, IRON, RETICCTPCT in the last 72 hours. Urine analysis: No results found for: COLORURINE, APPEARANCEUR, LABSPEC, PHURINE, GLUCOSEU, HGBUR, BILIRUBINUR, KETONESUR, PROTEINUR, UROBILINOGEN, NITRITE,  LEUKOCYTESUR  Radiological Exams on Admission: DG Chest 2 View  Result Date: 12/30/2019 CLINICAL DATA:  Productive cough EXAM: CHEST - 2 VIEW COMPARISON:  10/18/2019 FINDINGS: Frontal and lateral views of the chest demonstrate an enlarged cardiac silhouette. There is patchy right-sided airspace disease, primarily within the right upper and right lower lobes. Small right effusion. No pneumothorax. No acute bony abnormalities. IMPRESSION: 1. Patchy right airspace disease and small right pleural effusion, consistent with bronchopneumonia or asymmetric edema. 2. Enlarged cardiac silhouette. Electronically Signed   By: Sharlet Salina M.D.   On: 12/30/2019 22:41   DG Chest Portable 1 View  Result Date: 12/31/2019 CLINICAL DATA:  Shortness of breath and productive cough. EXAM: PORTABLE CHEST 1 VIEW COMPARISON:  Single-view of the chest that PA and lateral chest 10/18/2019. Single-view of the chest 02/23/2019. FINDINGS: Patchy airspace disease is seen in the right mid lung and right lung base. The left lung appears clear. Marked enlargement of the cardiopericardial silhouette  is unchanged. Aortic atherosclerosis is noted. There is likely a small right pleural effusion. No acute bony abnormality. IMPRESSION: Patchy airspace disease in the right mid lung and right lung base is worrisome for pneumonia. The patient also appears to have a small right pleural effusion. No change in marked enlargement of the cardiopericardial silhouette compatible with cardiomegaly and or pericardial effusion. Aortic Atherosclerosis (ICD10-I70.0). Electronically Signed   By: Drusilla Kanner M.D.   On: 12/31/2019 19:13    EKG: Independently reviewed.  Sinus rhythm Left bundle branch block  Assessment/Plan Principal Problem:   Pneumonia Active Problems:   Chronic systolic CHF (congestive heart failure) (HCC)   AKI (acute kidney injury) (HCC)   Pain, dental     Pneumonia Presumed community-acquired to rule out aspiration pneumonia Patient with a cough productive of greenish phlegm associated with worsening shortness of breath She has leukocytosis with a left shift We will treat empirically with IV Rocephin and Zithromax for presumed community-acquired pneumonia but will request speech therapy consult for swallow function evaluation to rule out aspiration We will keep patient n.p.o. until seen and evaluated by speech therapy   Acute kidney injury Most likely prerenal and related to poor oral intake worsened by diuretic and ACE inhibitor use At baseline patient has a serum creatinine of 0.97 but today on admission it is 1.96 Gentle IV fluid hydration due to increased risk of CHF exacerbation Hold furosemide and lisinopril Repeat renal parameters in a.m.    Chronic systolic heart failure Last known LVEF of about 20%, 2D echocardiogram done in 2016 We will repeat 2D echocardiogram to assess LVEF Hold Lasix and lisinopril    Dental pain Tylenol as needed for pain Continue antibiotic therapy Follow-up with oral surgeon as an outpatient   DVT prophylaxis: Lovenox Code  Status: Full code Family Communication: Greater than 50% of time was spent discussing plan of care with patient and her daughter at the bedside.  All questions and concerns have been addressed.  They verbalized understanding and agree with the plan.  CODE STATUS was discussed and she is a full code. Disposition Plan: Back to previous home environment Consults called: Speech therapy    Aleeah Greeno MD [facility]     12/31/2019, 9:53 PM

## 2019-12-31 NOTE — ED Provider Notes (Signed)
[facility] Emergency Department Provider Note    First MD Initiated Contact with Patient 12/31/19 1826     (approximate)  I have reviewed the triage vital signs and the nursing notes.   HISTORY  Chief Complaint Dental Pain, Shortness of Breath, and Abscess    HPI Zoe Boyle is a 79 y.o. female below listed past medical history presents to the ER for shortness of breath productive cough decreased p.o. intake confusion as well as complaining of left tooth pain.  Symptoms have been progressively worsening over the past few days.  Daughter initially thought it was related to a tooth ache and had scheduled outpatient follow-up with dentistry but patient has been drooling and having worsening productive cough.  Not currently on any antibiotics.  She was also concerned that she was having some chills no measured fevers.    Past Medical History:  Diagnosis Date  . Congestive heart failure (CHF) (HCC)   . Hypertension    No family history on file. History reviewed. No pertinent surgical history. There are no problems to display for this patient.     Prior to Admission medications   Medication Sig Start Date End Date Taking? Authorizing Provider  amoxicillin (AMOXIL) 500 MG capsule Take 1 capsule (500 mg total) by mouth 3 (three) times daily. 02/12/19   Fisher, Roselyn Bering, PA-C  furosemide (LASIX) 40 MG tablet Take 40 mg by mouth.    [provider]  lisinopril (ZESTRIL) 40 MG tablet Take 40 mg by mouth daily.    [provider]  pravastatin (PRAVACHOL) 40 MG tablet Take 40 mg by mouth daily.    [provider]  traMADol (ULTRAM) 50 MG tablet Take 1 tablet (50 mg total) by mouth every 6 (six) hours as needed. 02/12/19   Faythe Ghee, PA-C    Allergies Patient has no known allergies.    Social History Social History   Tobacco Use  . Smoking status: Former Games developer  . Smokeless tobacco: Never Used  Substance Use Topics  .  Alcohol use: Not Currently  . Drug use: Not Currently    Review of Systems Patient denies headaches, rhinorrhea, blurry vision, numbness, shortness of breath, chest pain, edema, cough, abdominal pain, nausea, vomiting, diarrhea, dysuria, fevers, rashes or hallucinations unless otherwise stated above in HPI. ____________________________________________   PHYSICAL EXAM:  VITAL SIGNS: Vitals:   12/31/19 1527  BP: 116/70  Pulse: 87  Resp: 18  Temp: 99.1 F (37.3 C)  SpO2: 96%    Constitutional: Alert but ill and frail appearing.  Eyes: Conjunctivae are normal.  Head: Atraumatic. Nose: No congestion/rhinnorhea. Mouth/Throat: Mucous membranes are moist.  Poor dentition, no fluctuance, no trismus Neck: No stridor. Painless ROM.  Cardiovascular: Normal rate, regular rhythm. Grossly normal heart sounds.  Good peripheral circulation. Respiratory: Normal respiratory effort.  No retractions. Lungs with diminished posterior breathsounds, right anterior rhonchi Gastrointestinal: Soft and nontender. No distention. No abdominal bruits. No CVA tenderness. Genitourinary:  Musculoskeletal: No lower extremity tenderness nor edema.  No joint effusions. Neurologic:  Normal speech and language. No gross focal neurologic deficits are appreciated. No facial droop Skin:  Skin is warm, dry and intact. No rash noted. Psychiatric: Mood and affect are normal. Speech and behavior are normal.  ____________________________________________   LABS (all labs ordered are listed, but only abnormal results are displayed)  Results for orders placed or performed during the hospital encounter of 12/31/19 (from the past 24 hour(s))  CBC  Status: Abnormal   Collection Time: 12/31/19  3:34 PM  Result Value Ref Range   WBC 12.1 (H) 4.0 - 10.5 K/uL   RBC 3.53 (L) 3.87 - 5.11 MIL/uL   Hemoglobin 9.4 (L) 12.0 - 15.0 g/dL   HCT 21.2 (L) 36 - 46 %   MCV 77.9 (L) 80.0 - 100.0 fL   MCH 26.6 26.0 - 34.0 pg   MCHC  34.2 30.0 - 36.0 g/dL   RDW 24.8 25.0 - 03.7 %   Platelets 230 150 - 400 K/uL   nRBC 0.0 0.0 - 0.2 %  Basic metabolic panel     Status: Abnormal   Collection Time: 12/31/19  3:34 PM  Result Value Ref Range   Sodium 139 135 - 145 mmol/L   Potassium 3.9 3.5 - 5.1 mmol/L   Chloride 101 98 - 111 mmol/L   CO2 23 22 - 32 mmol/L   Glucose, Bld 127 (H) 70 - 99 mg/dL   BUN 47 (H) 8 - 23 mg/dL   Creatinine, Ser 0.48 (H) 0.44 - 1.00 mg/dL   Calcium 9.9 8.9 - 88.9 mg/dL   GFR, Estimated 27 (L) >60 mL/min   Anion gap 15 5 - 15  Lactic acid, plasma     Status: None   Collection Time: 12/31/19  3:34 PM  Result Value Ref Range   Lactic Acid, Venous 1.2 0.5 - 1.9 mmol/L   ____________________________________________  EKG My review and personal interpretation at Time: 15:32   Indication: cough  Rate: 90  Rhythm: sinus Axis: normal Other: normal intervals, no stemi ____________________________________________  RADIOLOGY  I personally reviewed all radiographic images ordered to evaluate for the above acute complaints and reviewed radiology reports and findings.  These findings were personally discussed with the patient.  Please see medical record for radiology report.  ____________________________________________   PROCEDURES  Procedure(s) performed:  Procedures    Critical Care performed: no ____________________________________________   INITIAL IMPRESSION / ASSESSMENT AND PLAN / ED COURSE  Pertinent labs & imaging results that were available during my care of the patient were reviewed by me and considered in my medical decision making (see chart for details).   DDX: Asthma, copd, CHF, pna, ptx, malignancy, Pe, anemia   Zoe Boyle is a 79 y.o. who presents to the ED with chief complaint of cough shortness of breath as well as pain of her left upper tooth.  Do not identify any evidence of abscess.  Does have carious dentition.  She got low-grade temperature rising white count does  have a very thick productive cough with rhonchi on exam.  Chest x-ray from yesterday showed possible developing infiltrate.  Will repeat chest x-ray to evaluate for any evidence of worsening edema.  Clinical Course as of Dec 30 1928  Thu Dec 31, 2019  1929 Chest x-ray is concerning for worsening pulmonary infiltrate and associated effusion.  Does have rising white count.  Given her report of confusion decreased p.o. intake with evidence of AKI in the setting of pneumonia I do feel she would benefit from hospitalization for IV antibiotics and further monitoring she does not appear clinically well.  She is not hypoxic.  Family agreeable to plan.  Will start on IV antibiotics.  She not meeting septic criteria at this point given her normal lactate will give gentle IV hydration given her history of CHF.   [PR]    Clinical Course User Index [PR] Willy Eddy, MD    The patient was evaluated in Emergency  Department today for the symptoms described in the history of present illness. He/she was evaluated in the context of the global COVID-19 pandemic, which necessitated consideration that the patient might be at risk for infection with the SARS-CoV-2 virus that causes COVID-19. Institutional protocols and algorithms that pertain to the evaluation of patients at risk for COVID-19 are in a state of rapid change based on information released by regulatory bodies including the CDC and federal and state organizations. These policies and algorithms were followed during the patient's care in the ED.  As part of my medical decision making, I reviewed the following data within the electronic MEDICAL RECORD NUMBER Nursing notes reviewed and incorporated, Labs reviewed, notes from prior ED visits and [place] Controlled Substance Database   ____________________________________________   FINAL CLINICAL IMPRESSION(S) / ED DIAGNOSES  Final diagnoses:  Pneumonia of right lower lobe due to infectious organism  AKI (acute  kidney injury) (HCC)      NEW MEDICATIONS STARTED DURING THIS VISIT:  New Prescriptions   No medications on file     Note:  This document was prepared using Dragon voice recognition software and may include unintentional dictation errors.    Willy Eddy, MD 12/31/19 1930

## 2020-01-01 ENCOUNTER — Inpatient Hospital Stay
Admit: 2020-01-01 | Discharge: 2020-01-01 | Disposition: A | Discharge status: Home / Self Care | Payer: Medicare Other | Attending: Internal Medicine | Admitting: Internal Medicine

## 2020-01-01 ENCOUNTER — Encounter: Payer: Self-pay | Admitting: Internal Medicine

## 2020-01-01 DIAGNOSIS — N179 Acute kidney failure, unspecified: Secondary | ICD-10-CM | POA: Diagnosis not present

## 2020-01-01 DIAGNOSIS — E440 Moderate protein-calorie malnutrition: Secondary | ICD-10-CM | POA: Insufficient documentation

## 2020-01-01 DIAGNOSIS — J189 Pneumonia, unspecified organism: Secondary | ICD-10-CM | POA: Diagnosis not present

## 2020-01-01 DIAGNOSIS — E43 Unspecified severe protein-calorie malnutrition: Secondary | ICD-10-CM | POA: Insufficient documentation

## 2020-01-01 DIAGNOSIS — I5022 Chronic systolic (congestive) heart failure: Secondary | ICD-10-CM | POA: Diagnosis not present

## 2020-01-01 LAB — CBC
HCT: 25.8 % — ABNORMAL LOW (ref 36.0–46.0)
Hemoglobin: 8.7 g/dL — ABNORMAL LOW (ref 12.0–15.0)
MCH: 26.4 pg (ref 26.0–34.0)
MCHC: 33.7 g/dL (ref 30.0–36.0)
MCV: 78.2 fL — ABNORMAL LOW (ref 80.0–100.0)
Platelets: 211 10*3/uL (ref 150–400)
RBC: 3.3 MIL/uL — ABNORMAL LOW (ref 3.87–5.11)
RDW: 13.8 % (ref 11.5–15.5)
WBC: 15.9 10*3/uL — ABNORMAL HIGH (ref 4.0–10.5)
nRBC: 0 % (ref 0.0–0.2)

## 2020-01-01 LAB — BASIC METABOLIC PANEL
Anion gap: 15 (ref 5–15)
BUN: 40 mg/dL — ABNORMAL HIGH (ref 8–23)
CO2: 22 mmol/L (ref 22–32)
Calcium: 9.5 mg/dL (ref 8.9–10.3)
Chloride: 107 mmol/L (ref 98–111)
Creatinine, Ser: 1.4 mg/dL — ABNORMAL HIGH (ref 0.44–1.00)
GFR, Estimated: 39 mL/min — ABNORMAL LOW (ref >60)
Glucose, Bld: 123 mg/dL — ABNORMAL HIGH (ref 70–99)
Potassium: 3.6 mmol/L (ref 3.5–5.1)
Sodium: 144 mmol/L (ref 135–145)

## 2020-01-01 LAB — ECHOCARDIOGRAM COMPLETE
AR max vel: 1.34 cm2
AV Area VTI: 1.41 cm2
AV Area mean vel: 1.2 cm2
AV Mean grad: 8.3 mmHg
AV Peak grad: 15.3 mmHg
Ao pk vel: 1.96 m/s
Area-P 1/2: 4.68 cm2
Calc EF: 33.5 %
Height: 64 in
S' Lateral: 5.64 cm
Single Plane A2C EF: 40.2 %
Single Plane A4C EF: 25.8 %
Weight: 1954.16 oz

## 2020-01-01 LAB — PROCALCITONIN: Procalcitonin: 2.03 ng/mL

## 2020-01-01 LAB — HIV ANTIBODY (ROUTINE TESTING W REFLEX): HIV Screen 4th Generation wRfx: NONREACTIVE

## 2020-01-01 MED ORDER — CHLORHEXIDINE GLUCONATE 0.12 % MT SOLN
15.0000 mL | Freq: Two times a day (BID) | OROMUCOSAL | Status: DC
  Administered 2020-01-01 – 2020-01-02 (×3): 15 mL via OROMUCOSAL
  Filled 2020-01-01 (×3): qty 15

## 2020-01-01 MED ORDER — ENSURE ENLIVE PO LIQD
237.0000 mL | Freq: Two times a day (BID) | ORAL | Status: DC
  Administered 2020-01-01 – 2020-01-02 (×2): 237 mL via ORAL

## 2020-01-01 MED ORDER — SODIUM CHLORIDE 0.9 % IV SOLN
3.0000 g | Freq: Two times a day (BID) | INTRAVENOUS | Status: DC
  Administered 2020-01-01 – 2020-01-02 (×2): 3 g via INTRAVENOUS
  Filled 2020-01-01: qty 0.18
  Filled 2020-01-01: qty 8
  Filled 2020-01-01: qty 0.18
  Filled 2020-01-01 (×2): qty 8

## 2020-01-01 MED ORDER — ORAL CARE MOUTH RINSE: 15.0000 mL | Freq: Two times a day (BID) | OROMUCOSAL | Status: DC

## 2020-01-01 MED ORDER — ADULT MULTIVITAMIN W/MINERALS CH
1.0000 | ORAL_TABLET | Freq: Every day | ORAL | Status: DC
  Administered 2020-01-02: 1 via ORAL
  Filled 2020-01-01: qty 1

## 2020-01-01 MED ORDER — ASPIRIN EC 81 MG PO TBEC
81.0000 mg | DELAYED_RELEASE_TABLET | Freq: Every day | ORAL | Status: DC
  Administered 2020-01-01 – 2020-01-02 (×2): 81 mg via ORAL
  Filled 2020-01-01 (×2): qty 1

## 2020-01-01 MED ORDER — CARVEDILOL 25 MG PO TABS
25.0000 mg | ORAL_TABLET | Freq: Two times a day (BID) | ORAL | Status: DC
  Administered 2020-01-02: 25 mg via ORAL
  Filled 2020-01-01 (×2): qty 1

## 2020-01-01 NOTE — Progress Notes (Signed)
   12/31/19 2348  Assess: MEWS Score  Temp 99.3 F (37.4 C)  BP (!) 125/92  Pulse Rate (!) 121  Resp 20  SpO2 94 %  Assess: MEWS Score  MEWS Temp 0  MEWS Systolic 0  MEWS Pulse 2  MEWS RR 0  MEWS LOC 0  MEWS Score 2  MEWS Score Color Yellow  Treat  Pain Scale 0-10  Pain Score 0  Take Vital Signs  Increase Vital Sign Frequency  Yellow: Q 2hr X 2 then Q 4hr X 2, if remains yellow, continue Q 4hrs  Escalate  MEWS: Escalate Yellow: discuss with charge nurse/RN and consider discussing with provider and RRT  Notify: Charge Nurse/RN  Name of Charge Nurse/RN Notified Crystal,RN  Date Charge Nurse/RN Notified 01/01/20  Time Charge Nurse/RN Notified 0017  Notify: Provider  Provider Name/Title Webb Silversmith, NP  Date Provider Notified 01/01/20  Time Provider Notified [phone]  Notification Reason Change in status

## 2020-01-01 NOTE — Progress Notes (Addendum)
PROGRESS NOTE    Zoe Boyle  ZOX:[id number] DOB: [birth year] DOA: 12/31/2019 [facility]   Chief complaint.  Shortness of breath. Brief Narrative:   Zoe LatherRosa Boyle is a 79 y.o. female with medical history significant for chronic systolic heart failure with last known LVEF of about 20% from an echocardiogram done in 2016, history of hypertension who was brought into the emergency room by her daughter for evaluation of worsening cough productive of yellowish-green phlegm, right-sided facial swelling and increased drooling.   She also has some short of breath and wheezing.  Her chest x-ray showed right sided infiltrates with trace pleural effusion.  She is treated for community acquired pneumonia with Rocephin and Zithromax.  Patient does not have any dysphagia or choking.   Assessment & Plan:   Principal Problem:   Pneumonia Active Problems:   Chronic systolic CHF (congestive heart failure) (HCC)   AKI (acute kidney injury) (HCC)   Pain, dental  #1.  Community-acquired pneumonia. I reviewed chest x-ray, also independently reviewed x-ray images.  Patient has right sided infiltrates mainly on the lower field.  Trace amount of pleural effusion. Discussed with family, the peripheral patient started diet.  Will obtain speech therapy evaluation. Continue Rocephin and add Zithromax.  2.  Acute kidney injury. Reviewed previous lab over the past year, patient does not have a chronic kidney disease.  Renal function has been improving.  Due to patient poor LV function, I will discontinue IV fluids as patient no longer dehydrated.  3.  Chronic systolic congestive heart failure. Still euvolemic.  No evidence of exacerbation.  Restart home medicines including beta-blocker and aspirin.  #4.  Right lower dental caries and pain. Continue symptomatic treatment.  Continue antibiotics.  Outpatient follow-up with dentistry.  #5.  Depression and suicidal ideation. Patient voiced suicidal ideation  to the nurse, will obtain psychiatry evaluation  1526. Speech eval did not show aspiration. But pneumonia is likely caused by oral bacteria given dental caries. Changed to Unasyn from rocephin.   DVT prophylaxis: Lovenox Code Status: Full Family Communication: Discussed with patient daughter, confirmed patient full CODE STATUS, all questions answered Disposition Plan:     Status is: Inpatient  Remains inpatient appropriate because:Inpatient level of care appropriate due to severity of illness   Dispo: The patient is from: Home              Anticipated d/c is to: Home              Anticipated d/c date is: 2 days              Patient currently is not medically stable to d/c.        I/O last 3 completed shifts: In: 250 [IV Piggyback:250] Out: 200 [Urine:200] No intake/output data recorded.     Consultants:   None  Procedures: None  Antimicrobials:  Rocephin and Zithromax.  Subjective: Patient feels much better today.  Still has some pain on the right sided teeth, but feels hungry.  No nausea vomiting.  She denies any dysphagia or choking. She still has some mild short of breath, cough.  But much improved. No fever chills per No dysuria hematuria pain No headache or dizziness. No chest pain or palpitation.  Objective: Vitals:   01/01/20 0013 01/01/20 0159 01/01/20 0404 01/01/20 0824  BP: 114/78 110/78 104/66 104/70  Pulse: (!) 115 (!) 111 95 91  Resp: 20 20 20 18   Temp: 99.3 F (37.4 C) 99.6 F (37.6 C) 100  F (37.8 C)   TempSrc:  Oral Oral   SpO2: 93% 91% 94% 94%  Weight:      Height:        Intake/Output Summary (Last 24 hours) at 01/01/2020 1012 Last data filed at 01/01/2020 0644 Gross per 24 hour  Intake 250 ml  Output 200 ml  Net 50 ml   Filed Weights   12/31/19 1526 12/31/19 2358  Weight: 56.7 kg 55.4 kg    Examination:  General exam: Appears calm and comfortable  Respiratory system: Creased breathing sounds without crackles or  wheezes. Respiratory effort normal. Cardiovascular system: S1 & S2 heard, RRR. No JVD, murmurs, rubs, gallops or clicks. No pedal edema. Gastrointestinal system: Abdomen is nondistended, soft and nontender. No organomegaly or masses felt. Normal bowel sounds heard. Central nervous system: Alert and oriented x3. No focal neurological deficits. Extremities: Symmetric  Skin: No rashes, lesions or ulcers Psychiatry: Judgement and insight appear normal. Mood & affect appropriate.     Data Reviewed: I have personally reviewed following labs and imaging studies  CBC: Recent Labs  Lab 12/30/19 2223 12/31/19 1534 01/01/20 0331  WBC 11.7* 12.1* 15.9*  HGB 9.3* 9.4* 8.7*  HCT 28.2* 27.5* 25.8*  MCV 78.8* 77.9* 78.2*  PLT 212 230 211   Basic Metabolic Panel: Recent Labs  Lab 12/30/19 2223 12/31/19 1534 01/01/20 0331  NA 138 139 144  K 3.6 3.9 3.6  CL 102 101 107  CO2 22 23 22   GLUCOSE 146* 127* 123*  BUN 43* 47* 40*  CREATININE 2.63* 1.92* 1.40*  CALCIUM 9.5 9.9 9.5   GFR: Estimated Creatinine Clearance: 29.1 mL/min (A) (by C-G formula based on SCr of 1.4 mg/dL (H)). Liver Function Tests: No results for input(s): AST, ALT, ALKPHOS, BILITOT, PROT, ALBUMIN in the last 168 hours. No results for input(s): LIPASE, AMYLASE in the last 168 hours. No results for input(s): AMMONIA in the last 168 hours. Coagulation Profile: No results for input(s): INR, PROTIME in the last 168 hours. Cardiac Enzymes: No results for input(s): CKTOTAL, CKMB, CKMBINDEX, TROPONINI in the last 168 hours. BNP (last 3 results) No results for input(s): PROBNP in the last 8760 hours. HbA1C: No results for input(s): HGBA1C in the last 72 hours. CBG: No results for input(s): GLUCAP in the last 168 hours. Lipid Profile: No results for input(s): CHOL, HDL, LDLCALC, TRIG, CHOLHDL, LDLDIRECT in the last 72 hours. Thyroid Function Tests: No results for input(s): TSH, T4TOTAL, FREET4, T3FREE, THYROIDAB in the  last 72 hours. Anemia Panel: No results for input(s): VITAMINB12, FOLATE, FERRITIN, TIBC, IRON, RETICCTPCT in the last 72 hours. Sepsis Labs: Recent Labs  Lab 12/31/19 1534  LATICACIDVEN 1.2    Recent Results (from the past 240 hour(s))  Resp Panel by RT-PCR (Flu A&B, Covid) Nasopharyngeal Swab     Status: None   Collection Time: 12/31/19  7:55 PM   Specimen: Nasopharyngeal Swab; Nasopharyngeal(NP) swabs in vial transport medium  Result Value Ref Range Status   SARS Coronavirus 2 by RT PCR NEGATIVE NEGATIVE Final    Comment: (NOTE) SARS-CoV-2 target nucleic acids are NOT DETECTED.  The SARS-CoV-2 RNA is generally detectable in upper respiratory specimens during the acute phase of infection. The lowest concentration of SARS-CoV-2 viral copies this assay can detect is 138 copies/mL. A negative result does not preclude SARS-Cov-2 infection and should not be used as the sole basis for treatment or other patient management decisions. A negative result may occur with  improper specimen collection/handling, submission of specimen  other than nasopharyngeal swab, presence of viral mutation(s) within the areas targeted by this assay, and inadequate number of viral copies(<138 copies/mL). A negative result must be combined with clinical observations, patient history, and epidemiological information. The expected result is Negative.  Fact Sheet for Patients:  BloggerCourse.com  Fact Sheet for Healthcare Providers:  SeriousBroker.it  This test is no t yet approved or cleared by the [place] FDA and  has been authorized for detection and/or diagnosis of SARS-CoV-2 by FDA under an Emergency Use Authorization (EUA). This EUA will remain  in effect (meaning this test can be used) for the duration of the COVID-19 declaration under Section 564(b)(1) of the Act, 21 U.S.C.section 360bbb-3(b)(1), unless the authorization is terminated  or  revoked sooner.       Influenza A by PCR NEGATIVE NEGATIVE Final   Influenza B by PCR NEGATIVE NEGATIVE Final    Comment: (NOTE) The Xpert Xpress SARS-CoV-2/FLU/RSV plus assay is intended as an aid in the diagnosis of influenza from Nasopharyngeal swab specimens and should not be used as a sole basis for treatment. Nasal washings and aspirates are unacceptable for Xpert Xpress SARS-CoV-2/FLU/RSV testing.  Fact Sheet for Patients: BloggerCourse.com  Fact Sheet for Healthcare Providers: SeriousBroker.it  This test is not yet approved or cleared by the [place] FDA and has been authorized for detection and/or diagnosis of SARS-CoV-2 by FDA under an Emergency Use Authorization (EUA). This EUA will remain in effect (meaning this test can be used) for the duration of the COVID-19 declaration under Section 564(b)(1) of the Act, 21 U.S.C. section 360bbb-3(b)(1), unless the authorization is terminated or revoked.  Performed at [facility], [address] [place], [place] [address]   Blood culture (single)     Status: None (Preliminary result)   Collection Time: 12/31/19  8:41 PM   Specimen: BLOOD  Result Value Ref Range Status   Specimen Description BLOOD RIGHT FOREARM  Final   Special Requests   Final    BOTTLES DRAWN AEROBIC AND ANAEROBIC Blood Culture results may not be optimal due to an inadequate volume of blood received in culture bottles   Culture   Final    NO GROWTH < 12 HOURS Performed at [facility], [address] [place], [place] [address]    Report Status PENDING  Incomplete  Blood culture (single)     Status: None (Preliminary result)   Collection Time: 12/31/19  8:41 PM   Specimen: BLOOD  Result Value Ref Range Status   Specimen Description BLOOD LEFT ASSIST CONTROL  Final   Special Requests   Final    BOTTLES DRAWN AEROBIC AND ANAEROBIC Blood Culture results may not be optimal due to  an inadequate volume of blood received in culture bottles   Culture   Final    NO GROWTH < 12 HOURS Performed at [facility], [address], [place], [place] [address]    Report Status PENDING  Incomplete         Radiology Studies: DG Chest 2 View  Result Date: 12/30/2019 CLINICAL DATA:  Productive cough EXAM: CHEST - 2 VIEW COMPARISON:  10/18/2019 FINDINGS: Frontal and lateral views of the chest demonstrate an enlarged cardiac silhouette. There is patchy right-sided airspace disease, primarily within the right upper and right lower lobes. Small right effusion. No pneumothorax. No acute bony abnormalities. IMPRESSION: 1. Patchy right airspace disease and small right pleural effusion, consistent with bronchopneumonia or asymmetric edema. 2. Enlarged cardiac silhouette. Electronically Signed  By: Sharlet Salina M.D.   On: 12/30/2019 22:41   DG Chest Portable 1 View  Result Date: 12/31/2019 CLINICAL DATA:  Shortness of breath and productive cough. EXAM: PORTABLE CHEST 1 VIEW COMPARISON:  Single-view of the chest that PA and lateral chest 10/18/2019. Single-view of the chest 02/23/2019. FINDINGS: Patchy airspace disease is seen in the right mid lung and right lung base. The left lung appears clear. Marked enlargement of the cardiopericardial silhouette is unchanged. Aortic atherosclerosis is noted. There is likely a small right pleural effusion. No acute bony abnormality. IMPRESSION: Patchy airspace disease in the right mid lung and right lung base is worrisome for pneumonia. The patient also appears to have a small right pleural effusion. No change in marked enlargement of the cardiopericardial silhouette compatible with cardiomegaly and or pericardial effusion. Aortic Atherosclerosis (ICD10-I70.0). Electronically Signed   By: Drusilla Kanner M.D.   On: 12/31/2019 19:13        Scheduled Meds:  chlorhexidine  15 mL Mouth Rinse BID   enoxaparin (LOVENOX) injection  30 mg  Subcutaneous Q24H   mouth rinse  15 mL Mouth Rinse q12n4p   pravastatin  40 mg Oral Daily   Continuous Infusions:  sodium chloride 75 mL/hr at 01/01/20 0940   azithromycin     cefTRIAXone (ROCEPHIN)  IV       LOS: 1 day    Time spent: 36 minutes    Marrion Coy, MD [facility]   To contact the attending provider between 7A-7P or the covering provider during after hours 7P-7A, please log into the web site [email or web address] and access using universal [place] password for that web site. If you do not have the password, please call the hospital operator.  01/01/2020, 10:12 AM

## 2020-01-01 NOTE — Consult Note (Signed)
[facility] Face-to-Face Psychiatry Consult   Reason for Consult: Consult for this 79 year old woman in the hospital with pneumonia.  Alleged concern about suicidality. Referring Physician:  Chipper Herb Patient Identification: Zoe Boyle MRN:  [id number] Principal Diagnosis: Pneumonia Diagnosis:  Principal Problem:   Pneumonia Active Problems:   Chronic systolic CHF (congestive heart failure) (HCC)   AKI (acute kidney injury) (HCC)   Pain, dental   Total Time spent with patient: 1 hour  Subjective:   Zoe Boyle is a 79 y.o. female patient admitted with "I have pneumonia".  HPI: Patient seen chart reviewed.  Also gathered information from the patient's daughter who was present in the room.  Consult was apparently requested because of a report by nursing that the patient had described suicidal thoughts recently.  On interview today the patient reports that she had been feeling sick recently and came to the hospital with shortness of breath and was diagnosed with pneumonia.  She reports that her mood has been "fine".  Denies any depressed mood hopelessness or negativity.  Reports that she sleeps and eats fine with no change in any of those behaviors recently.  Denies feeling hopeless.  Patient absolutely denies any suicidal ideation.  She denies having made any comments about any suicidal ideation current or past on admission.  The patient's daughter states that she is absolutely certain the patient did not say anything about being suicidal because she, the daughter gave almost all of the history and answered all of the intake questions because her mother was so tired and her mouth was hurting.  Past Psychiatric History: No past psychiatric history at all.  Risk to Self:   Risk to Others:   Prior Inpatient Therapy:   Prior Outpatient Therapy:    Past Medical History:  Past Medical History:  Diagnosis Date  . Congestive heart failure (CHF) (HCC)   . Hypertension    History reviewed. No pertinent  surgical history. Family History:  Family History  Family history unknown: Yes   Family Psychiatric  History: None reported Social History:  Social History   Substance and Sexual Activity  Alcohol Use Not Currently     Social History   Substance and Sexual Activity  Drug Use Not Currently    Social History   Socioeconomic History  . Marital status: Divorced    Spouse name: Not on file  . Number of children: Not on file  . Years of education: Not on file  . Highest education level: Not on file  Occupational History  . Not on file  Tobacco Use  . Smoking status: Former Games developer  . Smokeless tobacco: Never Used  Substance and Sexual Activity  . Alcohol use: Not Currently  . Drug use: Not Currently  . Sexual activity: Not on file  Other Topics Concern  . Not on file  Social History Narrative  . Not on file   Social Determinants of Health   Financial Resource Strain:   . Difficulty of Paying Living Expenses: Not on file  Food Insecurity:   . Worried About Programme researcher, broadcasting/film/video in the Last Year: Not on file  . Ran Out of Food in the Last Year: Not on file  Transportation Needs:   . Lack of Transportation (Medical): Not on file  . Lack of Transportation (Non-Medical): Not on file  Physical Activity:   . Days of Exercise per Week: Not on file  . Minutes of Exercise per Session: Not on file  Stress:   . Feeling of  Stress : Not on file  Social Connections:   . Frequency of Communication with Friends and Family: Not on file  . Frequency of Social Gatherings with Friends and Family: Not on file  . Attends Religious Services: Not on file  . Active Member of Clubs or Organizations: Not on file  . Attends Banker Meetings: Not on file  . Marital Status: Not on file   Additional Social History:    Allergies:   Allergies  Allergen Reactions  . Lisinopril Swelling    TONGUE SWELLED    Labs:  Results for orders placed or performed during the hospital  encounter of 12/31/19 (from the past 48 hour(s))  CBC     Status: Abnormal   Collection Time: 12/31/19  3:34 PM  Result Value Ref Range   WBC 12.1 (H) 4.0 - 10.5 K/uL   RBC 3.53 (L) 3.87 - 5.11 MIL/uL   Hemoglobin 9.4 (L) 12.0 - 15.0 g/dL   HCT 35.0 (L) 36 - 46 %   MCV 77.9 (L) 80.0 - 100.0 fL   MCH 26.6 26.0 - 34.0 pg   MCHC 34.2 30.0 - 36.0 g/dL   RDW 09.3 81.8 - 29.9 %   Platelets 230 150 - 400 K/uL   nRBC 0.0 0.0 - 0.2 %    Comment: Performed at [facility], [address] [place], [place] [address]  Basic metabolic panel     Status: Abnormal   Collection Time: 12/31/19  3:34 PM  Result Value Ref Range   Sodium 139 135 - 145 mmol/L   Potassium 3.9 3.5 - 5.1 mmol/L   Chloride 101 98 - 111 mmol/L   CO2 23 22 - 32 mmol/L   Glucose, Bld 127 (H) 70 - 99 mg/dL    Comment: Glucose reference range applies only to samples taken after fasting for at least 8 hours.   BUN 47 (H) 8 - 23 mg/dL   Creatinine, Ser 6.78 (H) 0.44 - 1.00 mg/dL   Calcium 9.9 8.9 - 93.8 mg/dL   GFR, Estimated 27 (L) >60 mL/min    Comment: (NOTE) Calculated using the CKD-EPI Creatinine Equation (2021)    Anion gap 15 5 - 15    Comment: Performed at [facility], [address] [place], [place] [address]  Lactic acid, plasma     Status: None   Collection Time: 12/31/19  3:34 PM  Result Value Ref Range   Lactic Acid, Venous 1.2 0.5 - 1.9 mmol/L    Comment: Performed at [facility], [address], [place], [place] [address]  Resp Panel by RT-PCR (Flu A&B, Covid) Nasopharyngeal Swab     Status: None   Collection Time: 12/31/19  7:55 PM   Specimen: Nasopharyngeal Swab; Nasopharyngeal(NP) swabs in vial transport medium  Result Value Ref Range   SARS Coronavirus 2 by RT PCR NEGATIVE NEGATIVE    Comment: (NOTE) SARS-CoV-2 target nucleic acids are NOT DETECTED.  The SARS-CoV-2 RNA is generally detectable in upper respiratory specimens during the acute phase of infection. The  lowest concentration of SARS-CoV-2 viral copies this assay can detect is 138 copies/mL. A negative result does not preclude SARS-Cov-2 infection and should not be used as the sole basis for treatment or other patient management decisions. A negative result may occur with  improper specimen collection/handling, submission of specimen other than nasopharyngeal swab, presence of viral mutation(s) within the areas targeted by this assay, and inadequate number of viral copies(<138 copies/mL). A negative result  must be combined with clinical observations, patient history, and epidemiological information. The expected result is Negative.  Fact Sheet for Patients:  BloggerCourse.com  Fact Sheet for Healthcare Providers:  SeriousBroker.it  This test is no t yet approved or cleared by the [place] FDA and  has been authorized for detection and/or diagnosis of SARS-CoV-2 by FDA under an Emergency Use Authorization (EUA). This EUA will remain  in effect (meaning this test can be used) for the duration of the COVID-19 declaration under Section 564(b)(1) of the Act, 21 U.S.C.section 360bbb-3(b)(1), unless the authorization is terminated  or revoked sooner.       Influenza A by PCR NEGATIVE NEGATIVE   Influenza B by PCR NEGATIVE NEGATIVE    Comment: (NOTE) The Xpert Xpress SARS-CoV-2/FLU/RSV plus assay is intended as an aid in the diagnosis of influenza from Nasopharyngeal swab specimens and should not be used as a sole basis for treatment. Nasal washings and aspirates are unacceptable for Xpert Xpress SARS-CoV-2/FLU/RSV testing.  Fact Sheet for Patients: BloggerCourse.com  Fact Sheet for Healthcare Providers: SeriousBroker.it  This test is not yet approved or cleared by the [place] FDA and has been authorized for detection and/or diagnosis of SARS-CoV-2 by FDA under an Emergency  Use Authorization (EUA). This EUA will remain in effect (meaning this test can be used) for the duration of the COVID-19 declaration under Section 564(b)(1) of the Act, 21 U.S.C. section 360bbb-3(b)(1), unless the authorization is terminated or revoked.  Performed at [facility], [address] [place], [place] [address]   Blood culture (single)     Status: None (Preliminary result)   Collection Time: 12/31/19  8:41 PM   Specimen: BLOOD  Result Value Ref Range   Specimen Description BLOOD RIGHT FOREARM    Special Requests      BOTTLES DRAWN AEROBIC AND ANAEROBIC Blood Culture results may not be optimal due to an inadequate volume of blood received in culture bottles   Culture      NO GROWTH < 12 HOURS Performed at [facility], [address] [place], [place] [address]    Report Status PENDING   Blood culture (single)     Status: None (Preliminary result)   Collection Time: 12/31/19  8:41 PM   Specimen: BLOOD  Result Value Ref Range   Specimen Description BLOOD LEFT ASSIST CONTROL    Special Requests      BOTTLES DRAWN AEROBIC AND ANAEROBIC Blood Culture results may not be optimal due to an inadequate volume of blood received in culture bottles   Culture      NO GROWTH < 12 HOURS Performed at [facility], [address], [place], [place] [address]    Report Status PENDING   HIV Antibody (routine testing w rflx)     Status: None   Collection Time: 01/01/20  3:31 AM  Result Value Ref Range   HIV Screen 4th Generation wRfx Non Reactive Non Reactive    Comment: Performed at [facility], [address] [place], [place] [address]  Basic metabolic panel     Status: Abnormal   Collection Time: 01/01/20  3:31 AM  Result Value Ref Range   Sodium 144 135 - 145 mmol/L   Potassium 3.6 3.5 - 5.1 mmol/L   Chloride 107 98 - 111 mmol/L   CO2 22 22 - 32 mmol/L   Glucose, Bld 123 (H) 70 - 99 mg/dL    Comment: Glucose reference range applies only to  samples  taken after fasting for at least 8 hours.   BUN 40 (H) 8 - 23 mg/dL   Creatinine, Ser 1.611.40 (H) 0.44 - 1.00 mg/dL   Calcium 9.5 8.9 - 09.610.3 mg/dL   GFR, Estimated 39 (L) >60 mL/min    Comment: (NOTE) Calculated using the CKD-EPI Creatinine Equation (2021)    Anion gap 15 5 - 15    Comment: Performed at [facility], [address] [place], [place] [address]  CBC     Status: Abnormal   Collection Time: 01/01/20  3:31 AM  Result Value Ref Range   WBC 15.9 (H) 4.0 - 10.5 K/uL   RBC 3.30 (L) 3.87 - 5.11 MIL/uL   Hemoglobin 8.7 (L) 12.0 - 15.0 g/dL   HCT 98.125.8 (L) 36 - 46 %   MCV 78.2 (L) 80.0 - 100.0 fL   MCH 26.4 26.0 - 34.0 pg   MCHC 33.7 30.0 - 36.0 g/dL   RDW 19.113.8 47.811.5 - 29.515.5 %   Platelets 211 150 - 400 K/uL   nRBC 0.0 0.0 - 0.2 %    Comment: Performed at [facility], [address] [place], [place] [address]    Current Facility-Administered Medications  Medication Dose Route Frequency Provider Last Rate Last Admin  . acetaminophen (TYLENOL) 160 MG/5ML solution 650 mg  650 mg Oral Q6H PRN Agbata, Tochukwu, MD      . aspirin EC tablet 81 mg  81 mg Oral Daily Marrion CoyZhang, Dekui, MD   81 mg at 01/01/20 1141  . azithromycin (ZITHROMAX) 500 mg in sodium chloride 0.9 % 250 mL IVPB  500 mg Intravenous Q24H Agbata, Tochukwu, MD      . carvedilol (COREG) tablet 25 mg  25 mg Oral BID WC Marrion CoyZhang, Dekui, MD      . cefTRIAXone (ROCEPHIN) 2 g in sodium chloride 0.9 % 100 mL IVPB  2 g Intravenous Q24H Agbata, Tochukwu, MD      . chlorhexidine (PERIDEX) 0.12 % solution 15 mL  15 mL Mouth Rinse BID Marrion CoyZhang, Dekui, MD   15 mL at 01/01/20 0821  . enoxaparin (LOVENOX) injection 30 mg  30 mg Subcutaneous Q24H Agbata, Tochukwu, MD   30 mg at 01/01/20 0112  . feeding supplement (ENSURE ENLIVE / ENSURE PLUS) liquid 237 mL  237 mL Oral BID BM Marrion CoyZhang, Dekui, MD   237 mL at 01/01/20 1209  . MEDLINE mouth rinse  15 mL Mouth Rinse q12n4p Marrion CoyZhang, Dekui, MD      . Melene Muller[START ON 01/02/2020]  multivitamin with minerals tablet 1 tablet  1 tablet Oral Daily Marrion CoyZhang, Dekui, MD      . pravastatin (PRAVACHOL) tablet 40 mg  40 mg Oral Daily Agbata, Tochukwu, MD   40 mg at 01/01/20 [id number]    Musculoskeletal: Strength & Muscle Tone: decreased Gait & Station: unable to stand Patient leans: N/A  Psychiatric Specialty Exam: Physical Exam Vitals and nursing note reviewed.  Constitutional:      Appearance: She is well-developed.  HENT:     Head: Normocephalic and atraumatic.  Eyes:     Conjunctiva/sclera: Conjunctivae normal.     Pupils: Pupils are equal, round, and reactive to light.  Cardiovascular:     Heart sounds: Normal heart sounds.  Pulmonary:     Effort: Pulmonary effort is normal.  Abdominal:     Palpations: Abdomen is soft.  Musculoskeletal:        General: Normal range of motion.     Cervical back: Normal range of  motion.  Skin:    General: Skin is warm and dry.  Neurological:     Mental Status: She is alert.  Psychiatric:        Mood and Affect: Mood normal.        Speech: Speech is delayed.        Behavior: Behavior is cooperative.        Thought Content: Thought content normal.        Cognition and Memory: Cognition normal.        Judgment: Judgment normal.     Review of Systems  Constitutional: Positive for fatigue.  HENT: Negative.   Eyes: Negative.   Respiratory: Positive for shortness of breath.   Cardiovascular: Negative.   Gastrointestinal: Negative.   Musculoskeletal: Negative.   Skin: Negative.   Neurological: Negative.   Psychiatric/Behavioral: Negative.     Blood pressure 103/69, pulse 92, temperature 98.7 F (37.1 C), resp. rate 16, height  (1.626 m), weight 55.4 kg, SpO2 93 %.Body mass index is 20.96 kg/m.  General Appearance: Casual  Eye Contact:  Minimal  Speech:  Slow  Volume:  Decreased  Mood:  Euthymic  Affect:  Constricted  Thought Process:  Coherent  Orientation:  Full (Time, Place, and Person)  Thought Content:   Logical  Suicidal Thoughts:  No  Homicidal Thoughts:  No  Memory:  Immediate;   Fair Recent;   Fair Remote;   Fair  Judgement:  Fair  Insight:  Fair  Psychomotor Activity:  Decreased  Concentration:  Concentration: Fair  Recall:  [employer] of Knowledge:  Fair  Language:  Fair  Akathisia:  No  Handed:  Right  AIMS (if indicated):     Assets:  Desire for Improvement Housing Social Support  ADL's:  Impaired  Cognition:  WNL  Sleep:        Treatment Plan Summary: Plan No evidence of any depression.  No evidence of any suicidal ideation.  No evidence of any mental health problems at all.  The daughter speculates that perhaps someone got her mother mixed up with someone else because the entire thing makes no sense to them.  No indication for any further psychiatric treatment.  Signing off.  Disposition: No evidence of imminent risk to self or others at present.    Mordecai Rasmussen, MD 01/01/2020 3:09 PM

## 2020-01-01 NOTE — Progress Notes (Signed)
Initial Nutrition Assessment  DOCUMENTATION CODES:   Severe malnutrition in context of acute illness/injury  INTERVENTION:   Ensure Enlive po BID, each supplement provides 350 kcal and 20 grams of protein  MVI daily   Dysphagia 3 diet   Pt at moderate refeed risk; recommend monitor potassium, magnesium and phosphorus labs daily until stable  NUTRITION DIAGNOSIS:   Severe Malnutrition related to acute illness as evidenced by moderate to severe fat depletions, moderate to severe muscle depletions, 11 percent weight loss in 3 months.  GOAL:   Patient will meet greater than or equal to 90% of their needs  MONITOR:   PO intake, Supplement acceptance, Labs, Weight trends, Skin, I & O's  REASON FOR ASSESSMENT:   Malnutrition Screening Tool    ASSESSMENT:   79 y.o. female with medical history significant for chronic systolic heart failure with last known LVEF of about 20% from an echocardiogram done in 2016 and hypertension who is admitted with PNA   Met with pt in room today. Pt reports decreased oral intake for several months pta but reports that her oral intake has been poor for the past week. Pt reports that her poor oral intake is r/t tooth pain; pt reports that she has an infected tooth. Pt reports that she has been eating mainly soft foods at home; pt reports that she is unable to chew tough foods. Pt reports that she has been drinking strawberry Ensure daily at home. Per chart, pt is down 14lbs(11%) in 3 months; this is significant. Pt is aware of her recent weight loss. RD will add supplements and vitamins to help pt meet her estimated needs. RD will also change pt over to a mechanical soft diet and add sodium restrictions via [employer].   Medications reviewed and include: aspirin, lovenox, azithromycin, ceftriaxone   Labs reviewed: BUN 40(H), creat 1.40(H) Wbc- 15.9(H), Hgb 8.7(L), Hct 25.8(L)  NUTRITION - FOCUSED PHYSICAL EXAM:    Most Recent Value  Orbital Region  Mild depletion  Upper Arm Region Severe depletion  Thoracic and Lumbar Region Mild depletion  Buccal Region Mild depletion  Temple Region Moderate depletion  Clavicle Bone Region Severe depletion  Clavicle and Acromion Bone Region Severe depletion  Scapular Bone Region Moderate depletion  Dorsal Hand Moderate depletion  Patellar Region Severe depletion  Anterior Thigh Region Severe depletion  Posterior Calf Region Severe depletion  Edema (RD Assessment) None  Hair Reviewed  Eyes Reviewed  Mouth Reviewed  Skin Reviewed  Nails Reviewed     Diet Order:   Diet Order            DIET DYS 3 Room service appropriate? Yes; Fluid consistency: Thin  Diet effective now                EDUCATION NEEDS:   Education needs have been addressed  Skin:  Skin Assessment: Reviewed RN Assessment  Last BM:  12/2  Height:   Ht Readings from Last 1 Encounters:  12/31/19 $RemoveB'5\' 4"'[id number]$  (1.626 m)    Weight:   Wt Readings from Last 1 Encounters:  12/31/19 55.4 kg    Ideal Body Weight:  54.5 kg  BMI:  Body mass index is 20.96 kg/m.  Estimated Nutritional Needs:   Kcal:  1600-1800kcal/day  Protein:  80-90g/day  Fluid:  1.4-1.7L/day  Koleen Distance MS, RD, LDN Please refer to [facility] for RD and/or RD on-call/weekend/after hours pager

## 2020-01-01 NOTE — Evaluation (Signed)
Clinical/Bedside Swallow Evaluation Patient Details  Name: Zoe Boyle MRN: [id number] Date of Birth: 1940-08-17  Today's Date: 01/01/2020 Time: SLP Start Time (ACUTE ONLY): 1240 SLP Stop Time (ACUTE ONLY): 1330 SLP Time Calculation (min) (ACUTE ONLY): 50 min  Past Medical History:  Past Medical History:  Diagnosis Date  . Congestive heart failure (CHF) (HCC)   . Hypertension    Past Surgical History: History reviewed. No pertinent surgical history. HPI:  Pt is a 79 y.o. female with medical history significant for chronic systolic heart failue with last known LVEF of about 20% from an echocardiogram done in 2016, history of hypertension who was brought into the emergency room by her daughter for evaluation of worsening cough productive of yellowish-green phlegm, right-sided facial swelling and increased drooling. Patient was brought to the ER one day prior to her admission by her daughter but left without being seen.  Patient has had dental pain for 2 weeks and has been unable to eat or drink due to the pain.  Her daughter states that they have been forcing her to take an Ensure daily.  She has made an appointment to take the patient to the Dentist.  Pt has a h/o Dential Pain and admits to the ED in 2018, 2020.  Daughter stated pt's next appt w/ the Dentist is on Monday.  Chest x-ray reviewed by me shows  patchy airspace disease in the right mid lung and right lung base is worrisome for pneumonia, pleural effusion.  Pt stated it is diffcult to chew foods d/t gum pain.    Assessment / Plan / Recommendation Clinical Impression  Pt appears to present w/ adequate oropharyngeal phase swallow function w/ No oropharyngeal phase dysphagia noted, No neuromuscular deficits noted.Pt consumed po trials w/ No overt, clinical s/s of aspiration during po trials. Pt appears at reduced risk for aspiration following general aspiration precautions. However, pt does have sore gums (to touch) d/t Dental Pain. She has  most Dentition missing and Daughter stated pt is suppose to be back in the Dentist's Office on Monday for potential extraction if necessary. Pt has a chart h/o Dental Pain and reduced oral intake (2018, 2020). For her Lunch meal, pt ate few bites of a cheese omelette, grits, and drank juice though she endorsed sore gums w/ solid foods -- Dtr stated this was pt's preference and she wanted pt to have foods she desired in order to promote oral intake. During few po trials, pt consumed all consistencies w/ no overt coughing, decline in vocal quality, or change in respiratory presentation during/post trials. Oral phase appeared [facility] w/ timely bolus management and control of bolus propulsion for A-P transfer for swallowing. Oral clearing achieved w/ all trial consistencies. OM Exam appeared [facility] w/ no unilateral weakness noted. Speech Clear. Small lesion/sore spot; tender to touch. Missing Most Dentition. Pt fed self w/ setup support. Recommend a Mech Soft consistency diet w/ well-Cut meats, moistened foods; Thin liquids. Recommend general aspiration precautions, Pills WHOLE in Puree for safer, easier swallowing as needed. Education given on Pills in Puree; food consistencies and easy to eat options; general aspiration precautions. Brief education given on need for pt/Dentist to manage in oral infection to avoid increased bacteria in the mouth to lessen risk for aspiration of bacteria-laden saliva during sleeping thus impact on Pulmonary status. NSG to reconsult if any new needs arise. NSG agreed. Education given on food consistencies, and frequent oral care for hygiene. Pt and Daughter agreed. SLP Visit Diagnosis: Dysphagia, oral phase (R13.11) (  missing Dentition; painful oral cavity)    Aspiration Risk  Mild aspiration risk;Risk for inadequate nutrition/hydration (reduced when following precautions)    Diet Recommendation  Mech Soft diet w/ meats chopped/minced; Thin liquids. General aspiration precautions; Frequent  oral care for hygiene  Medication Administration: Whole meds with puree (if easier for swallowing)    Other  Recommendations Recommended Consults:  (Dietician f/u) Oral Care Recommendations: Oral care BID;Oral care before and after PO;Staff/trained caregiver to provide oral care Other Recommendations:  (n/a)   Follow up Recommendations None      Frequency and Duration  (n/a)   (n/a)       Prognosis Prognosis for Safe Diet Advancement: Fair Barriers to Reach Goals: Time post onset;Severity of deficits (Dential issues)      Swallow Study   General Date of Onset: 12/31/19 HPI: Pt is a 79 y.o. female with medical history significant for chronic systolic heart failue with last known LVEF of about 20% from an echocardiogram done in 2016, history of hypertension who was brought into the emergency room by her daughter for evaluation of worsening cough productive of yellowish-green phlegm, right-sided facial swelling and increased drooling. Patient was brought to the ER one day prior to her admission by her daughter but left without being seen.  Patient has had dental pain for 2 weeks and has been unable to eat or drink due to the pain.  Her daughter states that they have been forcing her to take an Ensure daily.  She has made an appointment to take the patient to the Dentist.  Pt has a h/o Dential Pain and admits to the ED in 2018, 2020.  Daughter stated pt's next appt w/ the Dentist is on Monday.  Chest x-ray reviewed by me shows  patchy airspace disease in the right mid lung and right lung base is worrisome for pneumonia, pleural effusion.  Pt stated it is diffcult to chew foods d/t gum pain.  Type of Study: Bedside Swallow Evaluation Previous Swallow Assessment: none Diet Prior to this Study: Regular;Thin liquids Temperature Spikes Noted: No (wbc 15.9) Respiratory Status: Room air History of Recent Intubation: No Behavior/Cognition: Alert;Cooperative;Pleasant mood;Requires cueing Oral  Cavity Assessment: Lesions;Edema (min; soreness) Oral Care Completed by SLP: Recent completion by staff (educated on w/ pt/Dtr) Oral Cavity - Dentition: Poor condition;Missing dentition (soreness) Vision: Functional for self-feeding Self-Feeding Abilities: Able to feed self;Needs assist;Needs set up Patient Positioning: Upright in bed (needed positioning upright in bed) Baseline Vocal Quality: Normal Volitional Cough: Strong Volitional Swallow: Able to elicit    Oral/Motor/Sensory Function Overall Oral Motor/Sensory Function: Within functional limits   Ice Chips Ice chips: Within functional limits Presentation: Spoon (fed; 1 trial)   Thin Liquid Thin Liquid: Within functional limits Presentation: Self Fed;Cup (several sips)    Nectar Thick Nectar Thick Liquid: Not tested   Honey Thick Honey Thick Liquid: Not tested   Puree Puree: Within functional limits Presentation: Self Fed;Spoon (5 trials)   Solid     Solid: Not tested Other Comments: had eaten breakfast        Jerilynn Som, MS, [employer] [facility] [phone] Coral Timme 01/01/2020,3:19 PM

## 2020-01-01 NOTE — Progress Notes (Signed)
While asking admission questions patient stated that she has had suicidal thoughts in the past week. She hasn't had any since she has been in the hospital. States she has no had active plan. She did not want to talk about the reasons why she has had SI. Provider notified and information was given to day nurse during report.

## 2020-01-01 NOTE — Progress Notes (Signed)
*  PRELIMINARY RESULTS* Echocardiogram 2D Echocardiogram has been performed.  Zoe Boyle 01/01/2020, 12:59 PM

## 2020-01-02 DIAGNOSIS — I5022 Chronic systolic (congestive) heart failure: Secondary | ICD-10-CM | POA: Diagnosis not present

## 2020-01-02 DIAGNOSIS — J189 Pneumonia, unspecified organism: Secondary | ICD-10-CM | POA: Diagnosis not present

## 2020-01-02 DIAGNOSIS — N179 Acute kidney failure, unspecified: Secondary | ICD-10-CM | POA: Diagnosis not present

## 2020-01-02 LAB — CBC WITH DIFFERENTIAL/PLATELET
Abs Immature Granulocytes: 0.11 10*3/uL — ABNORMAL HIGH (ref 0.00–0.07)
Basophils Absolute: 0 10*3/uL (ref 0.0–0.1)
Basophils Relative: 0 %
Eosinophils Absolute: 0.1 10*3/uL (ref 0.0–0.5)
Eosinophils Relative: 1 %
HCT: 25 % — ABNORMAL LOW (ref 36.0–46.0)
Hemoglobin: 8.5 g/dL — ABNORMAL LOW (ref 12.0–15.0)
Immature Granulocytes: 1 %
Lymphocytes Relative: 19 %
Lymphs Abs: 1.7 10*3/uL (ref 0.7–4.0)
MCH: 26.3 pg (ref 26.0–34.0)
MCHC: 34 g/dL (ref 30.0–36.0)
MCV: 77.4 fL — ABNORMAL LOW (ref 80.0–100.0)
Monocytes Absolute: 0.7 10*3/uL (ref 0.1–1.0)
Monocytes Relative: 8 %
Neutro Abs: 6.3 10*3/uL (ref 1.7–7.7)
Neutrophils Relative %: 71 %
Platelets: 230 10*3/uL (ref 150–400)
RBC: 3.23 MIL/uL — ABNORMAL LOW (ref 3.87–5.11)
RDW: 13.8 % (ref 11.5–15.5)
WBC: 9 10*3/uL (ref 4.0–10.5)
nRBC: 0 % (ref 0.0–0.2)

## 2020-01-02 LAB — BASIC METABOLIC PANEL
Anion gap: 11 (ref 5–15)
BUN: 30 mg/dL — ABNORMAL HIGH (ref 8–23)
CO2: 23 mmol/L (ref 22–32)
Calcium: 9.4 mg/dL (ref 8.9–10.3)
Chloride: 109 mmol/L (ref 98–111)
Creatinine, Ser: 0.98 mg/dL (ref 0.44–1.00)
GFR, Estimated: 59 mL/min — ABNORMAL LOW (ref >60)
Glucose, Bld: 131 mg/dL — ABNORMAL HIGH (ref 70–99)
Potassium: 3.3 mmol/L — ABNORMAL LOW (ref 3.5–5.1)
Sodium: 143 mmol/L (ref 135–145)

## 2020-01-02 LAB — IRON AND TIBC
Iron: 26 ug/dL — ABNORMAL LOW (ref 28–170)
Saturation Ratios: 22 % (ref 10.4–31.8)
TIBC: 118 ug/dL — ABNORMAL LOW (ref 250–450)
UIBC: 92 ug/dL

## 2020-01-02 LAB — MAGNESIUM: Magnesium: 2.3 mg/dL (ref 1.7–2.4)

## 2020-01-02 LAB — VITAMIN B12: Vitamin B-12: 1001 pg/mL — ABNORMAL HIGH (ref 180–914)

## 2020-01-02 LAB — PROCALCITONIN: Procalcitonin: 1.57 ng/mL

## 2020-01-02 MED ORDER — POTASSIUM CHLORIDE 20 MEQ PO PACK
40.0000 meq | PACK | ORAL | Status: AC
  Administered 2020-01-02 (×2): 40 meq via ORAL
  Filled 2020-01-02 (×2): qty 2

## 2020-01-02 MED ORDER — AMOXICILLIN-POT CLAVULANATE 875-125 MG PO TABS: 1.0000 | ORAL_TABLET | Freq: Two times a day (BID) | ORAL | 0 refills | Status: AC

## 2020-01-02 MED ORDER — AZITHROMYCIN 500 MG PO TABS: 500.0000 mg | ORAL_TABLET | Freq: Every day | ORAL | 0 refills | Status: AC

## 2020-01-02 NOTE — Discharge Summary (Addendum)
Physician Discharge Summary  Patient ID: Zoe Boyle MRN: [id number] DOB/AGE: 1940-05-18 79 y.o.  Admit date: 12/31/2019 Discharge date: 01/02/2020  Admission Diagnoses:  Discharge Diagnoses:  Principal Problem:   Pneumonia Active Problems:   Chronic systolic CHF (congestive heart failure) (HCC)   AKI (acute kidney injury) (HCC)   Pain, dental   Protein-calorie malnutrition, severe   Discharged Condition: good  Hospital Course:  Zoe Boyle a 79 y.o.femalewith medical history significant forchronic systolic heart failure with last known LVEF of about 20% from an echocardiogram done in 2016, history of hypertension who was brought into the emergency room by her daughter for evaluation of worsening cough productive of yellowish-green phlegm, right-sided facial swelling and increased drooling.  She also has some short of breath and wheezing.  Her chest x-ray showed right sided infiltrates with trace pleural effusion.  She is treated for community acquired pneumonia with Rocephin and Zithromax.  Patient does not have any dysphagia or choking.  #1.  Community-acquired pneumonia. I reviewed chest x-ray, also independently reviewed x-ray images.  Patient has right sided infiltrates mainly on the lower field.  Trace amount of pleural effusion. Patient was seen by speech therapy, no evidence of aspiration. However, due to patient dental caries, bacteria probably originated from oral cavity.  She is given Unasyn and Zithromax.  Condition much better today.  She is not having any hypoxemia, respiratory symptom has resolved.  At this point, she is medically stable to be discharged. Please repeat a chest x-ray in 2 or 3 weeks to make sure resolution of right pleural effusion.   2.  Acute kidney injury. Function has normalized  3.  Chronic systolic congestive heart failure. Still euvolemic.  No evidence of exacerbation.  Restart home medicines including beta-blocker and aspirin.  #4.   Right lower dental caries and pain. Continue symptomatic treatment.  Continue antibiotics.  Outpatient follow-up with dentistry.  #5.    No suicidal ideation.  #6.  Hypokalemia. Supplemented orally.  Consults: None  Significant Diagnostic Studies:  PORTABLE CHEST 1 VIEW  COMPARISON:  Single-view of the chest that PA and lateral chest 10/18/2019. Single-view of the chest 02/23/2019.  FINDINGS: Patchy airspace disease is seen in the right mid lung and right lung base. The left lung appears clear. Marked enlargement of the cardiopericardial silhouette is unchanged. Aortic atherosclerosis is noted. There is likely a small right pleural effusion. No acute bony abnormality.  IMPRESSION: Patchy airspace disease in the right mid lung and right lung base is worrisome for pneumonia. The patient also appears to have a small right pleural effusion.  No change in marked enlargement of the cardiopericardial silhouette compatible with cardiomegaly and or pericardial effusion.  Aortic Atherosclerosis (ICD10-I70.0).   Electronically Signed   By: Drusilla Kanner M.D.   On: 12/31/2019 19:13  Echo:  Left ventricular ejection fraction, by estimation, is 30 to 35%. The left ventricle has moderately decreased function. The left ventricle demonstrates regional wall motion abnormalities (see scoring diagram/findings for description). The left ventricular internal cavity size was moderately dilated. There is mild left ventricular hypertrophy. Left ventricular diastolic parameters are consistent with Grade I diastolic dysfunction (impaired relaxation). 2. Right ventricular systolic function is low normal. The right ventricular size is mildly enlarged. Mildly increased right ventricular wall thickness. 3. Left atrial size was mildly dilated. 4. Right atrial size was mildly dilated. 5. A small pericardial effusion is present. 6. The mitral valve is normal in structure. Mild mitral valve  regurgitation. 7. The aortic valve is  grossly normal. Aortic valve regurgitation is not visualized.  Treatments: Rocephin and Zithromax  Discharge Exam: Blood pressure 114/84, pulse 100, temperature 98.1 F (36.7 C), temperature source Oral, resp. rate 18, height 5\' 4"  (1.626 m), weight 55.4 kg, SpO2 97 %. General appearance: alert and cooperative Resp: clear to auscultation bilaterally Cardio: regular rate and rhythm, S1, S2 normal, no murmur, click, rub or gallop GI: soft, non-tender; bowel sounds normal; no masses,  no organomegaly Extremities: extremities normal, atraumatic, no cyanosis or edema  Disposition: Discharge disposition: 01-Home or Self Care       Discharge Instructions    Diet - low sodium heart healthy   Complete by: As directed    Increase activity slowly   Complete by: As directed      Allergies as of 01/02/2020      Reactions   Lisinopril Swelling   TONGUE SWELLED      Medication List    TAKE these medications   amoxicillin-clavulanate 875-125 MG tablet Commonly known as: Augmentin Take 1 tablet by mouth 2 (two) times daily for 5 days.   aspirin 81 MG EC tablet Take 81 mg by mouth daily.   azithromycin 500 MG tablet Commonly known as: Zithromax Take 1 tablet (500 mg total) by mouth daily for 2 days. Take 1 tablet daily for 3 days.   carvedilol 25 MG tablet Commonly known as: COREG Take 25 mg by mouth in the morning and at bedtime.   furosemide 40 MG tablet Commonly known as: LASIX Take 40 mg by mouth daily.   potassium chloride 10 MEQ tablet Commonly known as: KLOR-CON Take 10 mEq by mouth daily.   pravastatin 40 MG tablet Commonly known as: PRAVACHOL Take 40 mg by mouth daily.       Follow-up Information    [facility] Follow up in 1 week(s).   Contact information: [address] [place] [phone]              35 minutes Signed: [phone] 01/02/2020, 10:07 AM

## 2020-01-02 NOTE — Plan of Care (Signed)
Patient discharged to home with daughter who transported home.  No needs verbalized.  Discharge instructions and medication changes reviewed prior to discharge.

## 2020-01-02 NOTE — Plan of Care (Signed)
Patient has orders for discharge.  Daughter, Helga Asbury, notified and discharge instructions and medication changes reviewed who verbalized understanding without any issues.  Daughter will pick up prescriptions as well as pick her mom up [facility].  Ms. Ogletree will call floor when she arrives.  Will continue to montior.  PIV removed and telemetry discontinued prior to discharge.

## 2020-01-05 LAB — CULTURE, BLOOD (SINGLE)
Culture: NO GROWTH
Culture: NO GROWTH

## 2020-07-29 ENCOUNTER — Other Ambulatory Visit: Payer: Self-pay

## 2020-07-29 ENCOUNTER — Emergency Department: Payer: Medicare Other

## 2020-07-29 ENCOUNTER — Emergency Department
Admission: EM | Admit: 2020-07-29 | Discharge: 2020-07-29 | Disposition: A | Discharge status: Home / Self Care | Payer: Medicare Other | Attending: Emergency Medicine | Admitting: Emergency Medicine

## 2020-07-29 DIAGNOSIS — R0602 Shortness of breath: Secondary | ICD-10-CM | POA: Diagnosis present

## 2020-07-29 DIAGNOSIS — Z20822 Contact with and (suspected) exposure to covid-19: Secondary | ICD-10-CM | POA: Insufficient documentation

## 2020-07-29 DIAGNOSIS — Z7982 Long term (current) use of aspirin: Secondary | ICD-10-CM | POA: Insufficient documentation

## 2020-07-29 DIAGNOSIS — Z87891 Personal history of nicotine dependence: Secondary | ICD-10-CM | POA: Diagnosis not present

## 2020-07-29 DIAGNOSIS — I5023 Acute on chronic systolic (congestive) heart failure: Secondary | ICD-10-CM | POA: Insufficient documentation

## 2020-07-29 DIAGNOSIS — I110 Hypertensive heart disease with heart failure: Secondary | ICD-10-CM | POA: Insufficient documentation

## 2020-07-29 DIAGNOSIS — Z79899 Other long term (current) drug therapy: Secondary | ICD-10-CM | POA: Insufficient documentation

## 2020-07-29 DIAGNOSIS — I509 Heart failure, unspecified: Secondary | ICD-10-CM

## 2020-07-29 LAB — CBC WITH DIFFERENTIAL/PLATELET
Abs Immature Granulocytes: 0.17 10*3/uL — ABNORMAL HIGH (ref 0.00–0.07)
Basophils Absolute: 0.1 10*3/uL (ref 0.0–0.1)
Basophils Relative: 1 %
Eosinophils Absolute: 0.2 10*3/uL (ref 0.0–0.5)
Eosinophils Relative: 4 %
HCT: 33.7 % — ABNORMAL LOW (ref 36.0–46.0)
Hemoglobin: 10.8 g/dL — ABNORMAL LOW (ref 12.0–15.0)
Immature Granulocytes: 3 %
Lymphocytes Relative: 38 %
Lymphs Abs: 2.3 10*3/uL (ref 0.7–4.0)
MCH: 27.5 pg (ref 26.0–34.0)
MCHC: 32 g/dL (ref 30.0–36.0)
MCV: 85.8 fL (ref 80.0–100.0)
Monocytes Absolute: 0.3 10*3/uL (ref 0.1–1.0)
Monocytes Relative: 5 %
Neutro Abs: 2.9 10*3/uL (ref 1.7–7.7)
Neutrophils Relative %: 49 %
Platelets: 162 10*3/uL (ref 150–400)
RBC: 3.93 MIL/uL (ref 3.87–5.11)
RDW: 14.7 % (ref 11.5–15.5)
WBC: 6 10*3/uL (ref 4.0–10.5)
nRBC: 0 % (ref 0.0–0.2)

## 2020-07-29 LAB — COMPREHENSIVE METABOLIC PANEL
ALT: 18 U/L (ref 0–44)
AST: 34 U/L (ref 15–41)
Albumin: 3.7 g/dL (ref 3.5–5.0)
Alkaline Phosphatase: 40 U/L (ref 38–126)
Anion gap: 9 (ref 5–15)
BUN: 16 mg/dL (ref 8–23)
CO2: 23 mmol/L (ref 22–32)
Calcium: 9 mg/dL (ref 8.9–10.3)
Chloride: 108 mmol/L (ref 98–111)
Creatinine, Ser: 1.16 mg/dL — ABNORMAL HIGH (ref 0.44–1.00)
GFR, Estimated: 48 mL/min — ABNORMAL LOW (ref >60)
Glucose, Bld: 139 mg/dL — ABNORMAL HIGH (ref 70–99)
Potassium: 3.3 mmol/L — ABNORMAL LOW (ref 3.5–5.1)
Sodium: 140 mmol/L (ref 135–145)
Total Bilirubin: 1.2 mg/dL (ref 0.3–1.2)
Total Protein: 7.1 g/dL (ref 6.5–8.1)

## 2020-07-29 LAB — RESP PANEL BY RT-PCR (FLU A&B, COVID) ARPGX2
Influenza A by PCR: NEGATIVE
Influenza B by PCR: NEGATIVE
SARS Coronavirus 2 by RT PCR: NEGATIVE

## 2020-07-29 LAB — TROPONIN I (HIGH SENSITIVITY)
Troponin I (High Sensitivity): 30 ng/L — ABNORMAL HIGH (ref <18)
Troponin I (High Sensitivity): 37 ng/L — ABNORMAL HIGH (ref <18)

## 2020-07-29 LAB — BRAIN NATRIURETIC PEPTIDE: B Natriuretic Peptide: 3196.1 pg/mL — ABNORMAL HIGH (ref 0.0–100.0)

## 2020-07-29 MED ORDER — FUROSEMIDE 10 MG/ML IJ SOLN
40.0000 mg | Freq: Once | INTRAMUSCULAR | Status: AC
  Administered 2020-07-29: 40 mg via INTRAVENOUS
  Filled 2020-07-29: qty 4

## 2020-07-29 NOTE — ED Triage Notes (Signed)
Pt in with co shob that started today, denies any recent illness or hx of the same.

## 2020-07-29 NOTE — Discharge Instructions (Addendum)
Take your medicines every day as directed by your doctor.  Return to the ER for worsening symptoms, persistent vomiting, difficulty breathing or other concerns.

## 2020-07-29 NOTE — ED Provider Notes (Signed)
[facility] Department Provider Note   ____________________________________________   Event Date/Time   First MD Initiated Contact with Patient 07/29/20 [phone]     (approximate)  I have reviewed the triage vital signs and the nursing notes.   HISTORY  Chief Complaint Shortness of Breath    HPI Zoe Boyle is a 80 y.o. female who presents to the ED from home with a chief complaint of shortness of breath.  Patient has a history of hypertension, congestive heart failure not on continuous oxygenation.  She does take 40 mg of Lasix daily and states she has been compliant.  Reports a 1 day history of shortness of breath.  Denies fever, cough, chest pain, abdominal pain, nausea, vomiting, dizziness.  Denies increased swelling in her abdomen or legs.      Past Medical History:  Diagnosis Date   Congestive heart failure (CHF) (HCC)    Hypertension     Patient Active Problem List   Diagnosis Date Noted   Protein-calorie malnutrition, severe 01/01/2020   Pneumonia 12/31/2019   Chronic systolic CHF (congestive heart failure) (HCC) 12/31/2019   AKI (acute kidney injury) (HCC) 12/31/2019   Pain, dental 12/31/2019    No past surgical history on file.  Prior to Admission medications   Medication Sig Start Date End Date Taking? Authorizing Provider  aspirin 81 MG EC tablet Take 81 mg by mouth daily.    [provider]  carvedilol (COREG) 25 MG tablet Take 25 mg by mouth in the morning and at bedtime.    [provider]  furosemide (LASIX) 40 MG tablet Take 40 mg by mouth daily.     [provider]  potassium chloride (KLOR-CON) 10 MEQ tablet Take 10 mEq by mouth daily.    [provider]  pravastatin (PRAVACHOL) 40 MG tablet Take 40 mg by mouth daily.    [provider]    Allergies Lisinopril  Family History  Family history unknown: Yes    Social History Social History   Tobacco Use   Smoking  status: Former    Pack years: 0.00   Smokeless tobacco: Never  Substance Use Topics   Alcohol use: Not Currently   Drug use: Not Currently    Review of Systems  Constitutional: No fever/chills Eyes: No visual changes. ENT: No sore throat. Cardiovascular: Denies chest pain. Respiratory: Positive for shortness of breath. Gastrointestinal: No abdominal pain.  No nausea, no vomiting.  No diarrhea.  No constipation. Genitourinary: Negative for dysuria. Musculoskeletal: Negative for back pain. Skin: Negative for rash. Neurological: Negative for headaches, focal weakness or numbness.   ____________________________________________   PHYSICAL EXAM:  VITAL SIGNS: ED Triage Vitals [07/29/20 0159]  Enc Vitals Group     BP (!) 128/112     Pulse Rate (!) 102     Resp (!) 26     Temp 98.8 F (37.1 C)     Temp Source Oral     SpO2 96 %     Weight 130 lb (59 kg)     Height 5\' 5"  (1.651 m)     Head Circumference      Peak Flow      Pain Score 0     Pain Loc      Pain Edu?      Excl. in GC?     Constitutional: Alert and oriented.  Elderly appearing and in no acute distress. Eyes: Conjunctivae are normal. PERRL. EOMI. Head: Atraumatic. Nose: No congestion/rhinnorhea. Mouth/Throat:  Mucous membranes are moist.   Neck: No stridor.   Cardiovascular: Normal rate, regular rhythm. Grossly normal heart sounds.  Good peripheral circulation. Respiratory: Increased respiratory effort.  No retractions. Lungs with faint bibasilar rales. Gastrointestinal: Soft and nontender. No distention. No abdominal bruits. No CVA tenderness. Musculoskeletal: No lower extremity tenderness nor edema.  No joint effusions. Neurologic:  Normal speech and language. No gross focal neurologic deficits are appreciated. No gait instability. Skin:  Skin is warm, dry and intact. No rash noted. Psychiatric: Mood and affect are normal. Speech and behavior are normal.  ____________________________________________    LABS (all labs ordered are listed, but only abnormal results are displayed)  Labs Reviewed  CBC WITH DIFFERENTIAL/PLATELET - Abnormal; Notable for the following components:      Result Value   Hemoglobin 10.8 (*)    HCT 33.7 (*)    Abs Immature Granulocytes 0.17 (*)    All other components within normal limits  COMPREHENSIVE METABOLIC PANEL - Abnormal; Notable for the following components:   Potassium 3.3 (*)    Glucose, Bld 139 (*)    Creatinine, Ser 1.16 (*)    GFR, Estimated 48 (*)    All other components within normal limits  BRAIN NATRIURETIC PEPTIDE - Abnormal; Notable for the following components:   B Natriuretic Peptide 3,196.1 (*)    All other components within normal limits  TROPONIN I (HIGH SENSITIVITY) - Abnormal; Notable for the following components:   Troponin I (High Sensitivity) 30 (*)    All other components within normal limits  TROPONIN I (HIGH SENSITIVITY) - Abnormal; Notable for the following components:   Troponin I (High Sensitivity) 37 (*)    All other components within normal limits  RESP PANEL BY RT-PCR (FLU A&B, COVID) [id number]   ____________________________________________  EKG  ED ECG REPORT I, Kelleigh Skerritt J, the attending physician, personally viewed and interpreted this ECG.   Date: 07/29/2020  EKG Time: 0210  Rate: 85  Rhythm: normal sinus rhythm  Axis: Normal  Intervals:left bundle branch block  ST&T Change: Nonspecific  ____________________________________________  RADIOLOGY I, Mylasia Vorhees J, personally viewed and evaluated these images (plain radiographs) as part of my medical decision making, as well as reviewing the written report by the radiologist.  ED MD interpretation:  Vascular congestion, cardiomegaly, small bilateral pleural effusions  Official radiology report(s): DG Chest Portable 1 View  Result Date: 07/29/2020 CLINICAL DATA:  Shortness of breath EXAM: PORTABLE CHEST 1 VIEW COMPARISON:  12/31/2019 FINDINGS: Cardiomegaly. No  confluent airspace opacities. Small bilateral effusions. Vascular congestion. No acute bony abnormality. IMPRESSION: Cardiomegaly, vascular congestion. Small bilateral effusions. Electronically Signed   By: Charlett Nose M.D.   On: 07/29/2020 02:30    ____________________________________________   PROCEDURES  Procedure(s) performed (including Critical Care):  .1-3 Lead EKG Interpretation  Date/Time: 07/29/2020 2:25 AM Performed by: Irean Hong, MD Authorized by: Irean Hong, MD     Interpretation: normal     ECG rate:  85   ECG rate assessment: normal     Rhythm: sinus rhythm     Ectopy: none     Conduction: normal   Comments:     Patient placed on cardiac monitor to evaluate for arrhythmias   ____________________________________________   INITIAL IMPRESSION / ASSESSMENT AND PLAN / ED COURSE  As part of my medical decision making, I reviewed the following data within the electronic MEDICAL RECORD NUMBER Nursing notes reviewed and incorporated, Labs reviewed, EKG interpreted, Old chart reviewed, Radiograph reviewed, and Notes from  prior ED visits     80 year old female presenting with shortness of breath Differential includes, but is not limited to, viral syndrome, bronchitis including COPD exacerbation, pneumonia, reactive airway disease including asthma, CHF including exacerbation with or without pulmonary/interstitial edema, pneumothorax, ACS, thoracic trauma, and pulmonary embolism.   Will obtain cardiac work-up, chest x-ray.  Suspect CHF exacerbation, will administer IV Lasix.  Clinical Course as of 07/29/20 0703  Caleen Essex Jul 29, 2020  1884 Patient has been having frequent urinations since given IV Lasix.  Troponin remained stable.  No complaints of chest pain.  Room air saturation 97%.  Strict return precautions given.  Patient verbalizes understanding agrees with plan of care. [JS]    Clinical Course User Index [JS] Irean Hong, MD      ____________________________________________   FINAL CLINICAL IMPRESSION(S) / ED DIAGNOSES  Final diagnoses:  Shortness of breath  Acute on chronic congestive heart failure, unspecified heart failure type [facility])     ED Discharge Orders     None        Note:  This document was prepared using Dragon voice recognition software and may include unintentional dictation errors.    Irean Hong, MD 07/29/20 [phone]

## 2021-07-09 ENCOUNTER — Emergency Department: Payer: Medicare Other

## 2021-07-09 ENCOUNTER — Inpatient Hospital Stay
Admission: EM | Admit: 2021-07-09 | Discharge: 2021-07-13 | DRG: 871 | Disposition: A | Discharge status: Home / Self Care | Payer: Medicare Other | Attending: Internal Medicine | Admitting: Internal Medicine

## 2021-07-09 ENCOUNTER — Other Ambulatory Visit: Payer: Self-pay

## 2021-07-09 ENCOUNTER — Encounter: Payer: Self-pay | Admitting: Emergency Medicine

## 2021-07-09 DIAGNOSIS — I10 Essential (primary) hypertension: Secondary | ICD-10-CM

## 2021-07-09 DIAGNOSIS — A400 Sepsis due to streptococcus, group A: Principal | ICD-10-CM

## 2021-07-09 DIAGNOSIS — A419 Sepsis, unspecified organism: Secondary | ICD-10-CM | POA: Diagnosis present

## 2021-07-09 DIAGNOSIS — K0889 Other specified disorders of teeth and supporting structures: Secondary | ICD-10-CM | POA: Diagnosis present

## 2021-07-09 DIAGNOSIS — D631 Anemia in chronic kidney disease: Secondary | ICD-10-CM | POA: Diagnosis present

## 2021-07-09 DIAGNOSIS — N1831 Chronic kidney disease, stage 3a: Secondary | ICD-10-CM

## 2021-07-09 DIAGNOSIS — R531 Weakness: Secondary | ICD-10-CM

## 2021-07-09 DIAGNOSIS — N179 Acute kidney failure, unspecified: Secondary | ICD-10-CM | POA: Diagnosis present

## 2021-07-09 DIAGNOSIS — I5042 Chronic combined systolic (congestive) and diastolic (congestive) heart failure: Secondary | ICD-10-CM | POA: Diagnosis present

## 2021-07-09 DIAGNOSIS — D638 Anemia in other chronic diseases classified elsewhere: Secondary | ICD-10-CM

## 2021-07-09 DIAGNOSIS — I5022 Chronic systolic (congestive) heart failure: Secondary | ICD-10-CM | POA: Diagnosis present

## 2021-07-09 DIAGNOSIS — E876 Hypokalemia: Secondary | ICD-10-CM | POA: Diagnosis present

## 2021-07-09 DIAGNOSIS — J020 Streptococcal pharyngitis: Secondary | ICD-10-CM | POA: Diagnosis present

## 2021-07-09 DIAGNOSIS — Z87891 Personal history of nicotine dependence: Secondary | ICD-10-CM | POA: Diagnosis not present

## 2021-07-09 DIAGNOSIS — Z20822 Contact with and (suspected) exposure to covid-19: Secondary | ICD-10-CM | POA: Diagnosis present

## 2021-07-09 DIAGNOSIS — E43 Unspecified severe protein-calorie malnutrition: Secondary | ICD-10-CM | POA: Diagnosis present

## 2021-07-09 DIAGNOSIS — I130 Hypertensive heart and chronic kidney disease with heart failure and stage 1 through stage 4 chronic kidney disease, or unspecified chronic kidney disease: Secondary | ICD-10-CM | POA: Diagnosis present

## 2021-07-09 DIAGNOSIS — Z6825 Body mass index (BMI) 25.0-25.9, adult: Secondary | ICD-10-CM | POA: Diagnosis not present

## 2021-07-09 DIAGNOSIS — R32 Unspecified urinary incontinence: Secondary | ICD-10-CM | POA: Diagnosis present

## 2021-07-09 DIAGNOSIS — R6520 Severe sepsis without septic shock: Secondary | ICD-10-CM

## 2021-07-09 DIAGNOSIS — R509 Fever, unspecified: Principal | ICD-10-CM

## 2021-07-09 DIAGNOSIS — R159 Full incontinence of feces: Secondary | ICD-10-CM | POA: Diagnosis present

## 2021-07-09 DIAGNOSIS — Z7982 Long term (current) use of aspirin: Secondary | ICD-10-CM

## 2021-07-09 DIAGNOSIS — Z79899 Other long term (current) drug therapy: Secondary | ICD-10-CM

## 2021-07-09 DIAGNOSIS — B955 Unspecified streptococcus as the cause of diseases classified elsewhere: Secondary | ICD-10-CM | POA: Diagnosis not present

## 2021-07-09 DIAGNOSIS — D649 Anemia, unspecified: Secondary | ICD-10-CM | POA: Diagnosis not present

## 2021-07-09 DIAGNOSIS — R600 Localized edema: Secondary | ICD-10-CM

## 2021-07-09 DIAGNOSIS — E440 Moderate protein-calorie malnutrition: Secondary | ICD-10-CM | POA: Diagnosis present

## 2021-07-09 DIAGNOSIS — N178 Other acute kidney failure: Secondary | ICD-10-CM | POA: Diagnosis not present

## 2021-07-09 HISTORY — DX: Sepsis, unspecified organism: A41.9

## 2021-07-09 LAB — COMPREHENSIVE METABOLIC PANEL
ALT: 26 U/L (ref 0–44)
AST: 38 U/L (ref 15–41)
Albumin: 3.3 g/dL — ABNORMAL LOW (ref 3.5–5.0)
Alkaline Phosphatase: 75 U/L (ref 38–126)
Anion gap: 10 (ref 5–15)
BUN: 34 mg/dL — ABNORMAL HIGH (ref 8–23)
CO2: 21 mmol/L — ABNORMAL LOW (ref 22–32)
Calcium: 9 mg/dL (ref 8.9–10.3)
Chloride: 110 mmol/L (ref 98–111)
Creatinine, Ser: 1.74 mg/dL — ABNORMAL HIGH (ref 0.44–1.00)
GFR, Estimated: 29 mL/min — ABNORMAL LOW (ref >60)
Glucose, Bld: 158 mg/dL — ABNORMAL HIGH (ref 70–99)
Potassium: 3.2 mmol/L — ABNORMAL LOW (ref 3.5–5.1)
Sodium: 141 mmol/L (ref 135–145)
Total Bilirubin: 1.3 mg/dL — ABNORMAL HIGH (ref 0.3–1.2)
Total Protein: 7.6 g/dL (ref 6.5–8.1)

## 2021-07-09 LAB — CBC WITH DIFFERENTIAL/PLATELET
Abs Immature Granulocytes: 0.36 10*3/uL — ABNORMAL HIGH (ref 0.00–0.07)
Basophils Absolute: 0 10*3/uL (ref 0.0–0.1)
Basophils Relative: 0 %
Eosinophils Absolute: 0 10*3/uL (ref 0.0–0.5)
Eosinophils Relative: 0 %
HCT: 26.3 % — ABNORMAL LOW (ref 36.0–46.0)
Hemoglobin: 8.4 g/dL — ABNORMAL LOW (ref 12.0–15.0)
Immature Granulocytes: 5 %
Lymphocytes Relative: 8 %
Lymphs Abs: 0.6 10*3/uL — ABNORMAL LOW (ref 0.7–4.0)
MCH: 27.4 pg (ref 26.0–34.0)
MCHC: 31.9 g/dL (ref 30.0–36.0)
MCV: 85.7 fL (ref 80.0–100.0)
Monocytes Absolute: 0.4 10*3/uL (ref 0.1–1.0)
Monocytes Relative: 4 %
Neutro Abs: 6.7 10*3/uL (ref 1.7–7.7)
Neutrophils Relative %: 83 %
Platelets: 100 10*3/uL — ABNORMAL LOW (ref 150–400)
RBC: 3.07 MIL/uL — ABNORMAL LOW (ref 3.87–5.11)
RDW: 13.6 % (ref 11.5–15.5)
WBC: 8 10*3/uL (ref 4.0–10.5)
nRBC: 0 % (ref 0.0–0.2)

## 2021-07-09 LAB — URINALYSIS, COMPLETE (UACMP) WITH MICROSCOPIC
Bilirubin Urine: NEGATIVE
Glucose, UA: NEGATIVE mg/dL
Ketones, ur: NEGATIVE mg/dL
Leukocytes,Ua: NEGATIVE
Nitrite: NEGATIVE
Protein, ur: 100 mg/dL — AB
Specific Gravity, Urine: 1.019 (ref 1.005–1.030)
pH: 5 (ref 5.0–8.0)

## 2021-07-09 LAB — LACTIC ACID, PLASMA
Lactic Acid, Venous: 1.4 mmol/L (ref 0.5–1.9)
Lactic Acid, Venous: 1.2 mmol/L (ref 0.5–1.9)

## 2021-07-09 LAB — RESP PANEL BY RT-PCR (FLU A&B, COVID) ARPGX2
Influenza A by PCR: NEGATIVE
Influenza B by PCR: NEGATIVE
SARS Coronavirus 2 by RT PCR: NEGATIVE

## 2021-07-09 LAB — APTT: aPTT: 36 seconds (ref 24–36)

## 2021-07-09 LAB — PROTIME-INR
INR: 1.2 (ref 0.8–1.2)
Prothrombin Time: 15.1 seconds (ref 11.4–15.2)

## 2021-07-09 MED ORDER — VANCOMYCIN HCL IN DEXTROSE 1-5 GM/200ML-% IV SOLN
1000.0000 mg | Freq: Once | INTRAVENOUS | Status: AC
Start: 2021-07-09 — End: 2021-07-09
  Administered 2021-07-09: 1000 mg via INTRAVENOUS
  Filled 2021-07-09: qty 200

## 2021-07-09 MED ORDER — METRONIDAZOLE 500 MG/100ML IV SOLN
500.0000 mg | Freq: Once | INTRAVENOUS | Status: AC
  Administered 2021-07-09: 500 mg via INTRAVENOUS
  Filled 2021-07-09: qty 100

## 2021-07-09 MED ORDER — LACTATED RINGERS IV SOLN: INTRAVENOUS | Status: DC

## 2021-07-09 MED ORDER — SODIUM CHLORIDE 0.9 % IV BOLUS (SEPSIS)
1000.0000 mL | Freq: Once | INTRAVENOUS | Status: AC
  Administered 2021-07-09: 1000 mL via INTRAVENOUS

## 2021-07-09 MED ORDER — CEFEPIME HCL 2 G IV SOLR
2.0000 g | Freq: Once | INTRAVENOUS | Status: AC
Start: 2021-07-09 — End: 2021-07-09
  Administered 2021-07-09: 2 g via INTRAVENOUS
  Filled 2021-07-09: qty 12.5

## 2021-07-09 NOTE — ED Triage Notes (Signed)
Pt coming from home via [facility]. Per EMS, pt's daughter called out states that pt has been having increased weakness since last Thursday. Pt also has been incontinent of stool and urine.   Pt's fever was 103 tympanic with EMS. EMS gave pt 650 mg of tylenol.

## 2021-07-09 NOTE — Consult Note (Signed)
CODE SEPSIS - PHARMACY COMMUNICATION  **Broad Spectrum Antibiotics should be administered within 1 hour of Sepsis diagnosis**  Time Code Sepsis Called/Page Received: 2024  Antibiotics Ordered: cefepime, vancomycin, and metronidazole  Time of 1st antibiotic administration: 2046  Additional action taken by pharmacy: none  If necessary, Name of Provider/Nurse Contacted: n/a   Sharone Almond Rodriguez-Guzman PharmD, BCPS 07/09/2021 8:20 PM

## 2021-07-09 NOTE — Consult Note (Signed)
PHARMACY -  BRIEF ANTIBIOTIC NOTE   Pharmacy has received consult(s) for Cefepime and vancomycin from an ED provider.  The patient's profile has been reviewed for ht/wt/allergies/indication/available labs.    One time order(s) placed for  cefepime 2gm Vancomycin 1gm  Further antibiotics/pharmacy consults should be ordered by admitting physician if indicated.                       Thank you, Christerpher Clos Rodriguez-Guzman PharmD, BCPS 07/09/2021 8:19 PM

## 2021-07-09 NOTE — ED Provider Notes (Addendum)
[facility] Provider Note    Event Date/Time   First MD Initiated Contact with Patient 07/09/21 2008     (approximate)  History   Chief Complaint: Code Sepsis  HPI  Zoe Boyle is a 81 y.o. female with a past medical history of CHF, hypertension, presents to the emergency department for generalized weakness fatigue found to be febrile to 103 per EMS.  According to EMS report family reports for the past 3 days patient has been weak and fatigued and at times incontinent of urine and stool.  They called for weakness EMS found the patient to be febrile to 103.  Here the patient is awake alert she has no complaints denies any chest pain abdominal pain denies any recent cough.  Patient states she did not know she had a fever.  Physical Exam   Triage Vital Signs: ED Triage Vitals [07/09/21 2016]  Enc Vitals Group     BP      Pulse      Resp      Temp      Temp src      SpO2 98 %     Weight      Height      Head Circumference      Peak Flow      Pain Score      Pain Loc      Pain Edu?      Excl. in GC?     Most recent vital signs: Vitals:   07/09/21 2016  SpO2: 98%    General: Awake, no distress.  CV:  Good peripheral perfusion.  Regular rate and rhythm  Resp:  Normal effort.  Equal breath sounds bilaterally.  Abd:  No distention.  Soft, nontender.  No rebound or guarding.  Benign abdomen   ED Results / Procedures / Treatments   EKG  EKG viewed and interpreted by myself shows sinus arrhythmia 95 bpm with a widened QRS, normal axis, prolonged PR interval, nonspecific ST changes.  Morphology largely unchanged from prior EKG 07/29/2020.  RADIOLOGY  Chest x-ray shows cardiomegaly but no large pneumonia seen on my interpretation. Radiology is read the chest x-ray is cardiomegaly unchanged from prior exam.  No acute findings. CT scan read as negative for acute abnormality.   MEDICATIONS ORDERED IN ED: Medications  lactated ringers infusion (has  no administration in time range)  sodium chloride 0.9 % bolus 1,000 mL (has no administration in time range)  ceFEPIme (MAXIPIME) 2 g in sodium chloride 0.9 % 100 mL IVPB (has no administration in time range)  metroNIDAZOLE (FLAGYL) IVPB 500 mg (has no administration in time range)  vancomycin (VANCOCIN) IVPB 1000 mg/200 mL premix (has no administration in time range)     IMPRESSION / MDM / ASSESSMENT AND PLAN / ED COURSE  I reviewed the triage vital signs and the nursing notes.  Patient's presentation is most consistent with acute presentation with potential threat to life or bodily function.  Patient presents to the emergency department for several days of weakness fatigue found to be febrile to 103 per EMS.  Patient has been having diarrhea and frequent urination today.  No vomiting.  Denies abdominal pain or chest pain.  No recent cough or congestion.  Patient meets sepsis criteria given her increased respiratory rate and fever to 103.6 on arrival.  We will start broad-spectrum antibiotics, blood cultures, labs and continue to closely monitor.  Differential would include UTI, pneumonia, COVID or other infectious etiology.  Patient's labs have resulted showing a an equivocal urinalysis with red cells and rare bacteria but no obvious white blood cells.  CBC is nonrevealing showing anemia however largely unchanged from historical values.  COVID and flu is negative patient CMP shows renal insufficiency compared to baseline.  Patient receiving IV fluids.  Chest x-ray shows no concerning findings CT scan of the abdomen and pelvis is negative as well.  Given the patient's weakness fever to 103.6 despite no obvious infectious source found on work-up we will admit to the hospital service for ongoing antibiotics until the patient's blood and urine cultures have grown out.  Patient agreeable to plan.  Mentating well, no signs of meningitis.  CRITICAL CARE Performed by: Minna Antis   Total  critical care time: 30 minutes  Critical care time was exclusive of separately billable procedures and treating other patients.  Critical care was necessary to treat or prevent imminent or life-threatening deterioration.  Critical care was time spent personally by me on the following activities: development of treatment plan with patient and/or surrogate as well as nursing, discussions with consultants, evaluation of patient's response to treatment, examination of patient, obtaining history from patient or surrogate, ordering and performing treatments and interventions, ordering and review of laboratory studies, ordering and review of radiographic studies, pulse oximetry and re-evaluation of patient's condition.   FINAL CLINICAL IMPRESSION(S) / ED DIAGNOSES   Fever of unknown origin Sepsis   Note:  This document was prepared using Dragon voice recognition software and may include unintentional dictation errors.   Minna Antis, MD 07/09/21 2226    Minna Antis, MD 07/09/21 2230

## 2021-07-09 NOTE — Assessment & Plan Note (Signed)
Source unknown Continue sepsis fluids Continue cefepime Vanco and Flagyl Follow procalcitonin, cultures stool of diarrhea Follow blood cultures

## 2021-07-09 NOTE — Assessment & Plan Note (Signed)
Improved w/ IV fluids  Stopped IV hydration   monitor renal function closely on diuresis

## 2021-07-09 NOTE — Assessment & Plan Note (Signed)
Hemoglobin 8.4.  Most recent was 12 about a year prior  Serial H&H  Anemia panel

## 2021-07-09 NOTE — Assessment & Plan Note (Addendum)
Cardiomegaly   Echo 06/12: to monitor for overload given IV fluid resuscitation - LVEF 25-30 severely decreased function global hypokinesis, mod/sev dilated LV, grade 1 diastolic dysfunction. RV systolic fxn low normal, RV mildly enlarges.  Appears grossly compensated despite echo results.   Continued Lasix and carvedilol --> 06/13 w/ echo report back and improvement in renal function, increased to Lasix 60 mg IV x2 today, monitor I&O, can hopefully resume home diuretic regimen tomorrow

## 2021-07-09 NOTE — Sepsis Progress Note (Signed)
Elink following code sepsis °

## 2021-07-09 NOTE — Assessment & Plan Note (Signed)
Possibly multifactorial and related to generalized frailty, acute illness and symptomatic anemia  Treat as outlined above  outpatient PT/OT

## 2021-07-09 NOTE — Assessment & Plan Note (Signed)
Continue carvedilol 

## 2021-07-09 NOTE — H&P (Signed)
History and Physical    Patient: Zoe Boyle [id number] DOB: 1940-06-28 DOA: 07/09/2021 DOS: the patient was seen and examined on 07/09/2021 PCP: [facility]  Patient coming from: Home  Chief Complaint:  Chief Complaint  Patient presents with   Code Sepsis    HPI: Zoe Boyle is a 81 y.o. female with medical history significant for Systolic heart failure, last EF 30 to 35% December 2021, CKD 3A, HTN, who presented by EMS for evaluation of a several day history of generalized weakness, now with urinary and fecal incontinence but without diarrhea.  EMS reported a temperature of 103.  Patient denies cough or shortness of breath, nausea, vomiting or abdominal pain.  Denies headache ED course and data review: Temperature 103.6, pulse 106, respirations 24, BP 101/58 with O2 sat at 100% on room air. Labs: WBC 8 with lactic acid 1.2.  Hemoglobin 8.4.  Creatinine 1.74 up from baseline of 1.16, potassium 3.2, total bili 1.3, urinalysis with large hemoglobin and rare bacteria.  COVID and flu negative. EKG, personally viewed and interpreted: Sinus arrhythmia at 95 with LBBB Chest x-ray nonacute CT chest abdomen and pelvis showing the following findings:   Patient started on sepsis fluids, treated with cefepime vancomycin and Flagyl for sepsis of unknown source and hospitalist consulted for admission.   Review of Systems: As mentioned in the history of present illness. All other systems reviewed and are negative.  Past Medical History:  Diagnosis Date   Congestive heart failure (CHF) ([place])    Hypertension    History reviewed. No pertinent surgical history. Social History:  reports that she has quit smoking. She has never used smokeless tobacco. She reports that she does not currently use alcohol. She reports that she does not currently use drugs.  Allergies  Allergen Reactions   Lisinopril Swelling    TONGUE SWELLED    Family History  Family history unknown: Yes    Prior to  Admission medications   Medication Sig Start Date End Date Taking? Authorizing Provider  aspirin 81 MG EC tablet Take 81 mg by mouth daily.    [provider]  carvedilol (COREG) 25 MG tablet Take 25 mg by mouth in the morning and at bedtime.    [provider]  furosemide (LASIX) 40 MG tablet Take 40 mg by mouth daily.     [provider]  potassium chloride (KLOR-CON) 10 MEQ tablet Take 10 mEq by mouth daily.    [provider]  pravastatin (PRAVACHOL) 40 MG tablet Take 40 mg by mouth daily.    [provider]    Physical Exam: Vitals:   07/09/21 2021 07/09/21 2031 07/09/21 2130 07/09/21 2211  BP:  (!) 101/58 100/63 108/71  Pulse: (!) 106  91 87  Resp: (!) 24  (!) 32 (!) 23  Temp: (!) 103.6 F (39.8 C)     TempSrc: Rectal     SpO2: 100%  98% 97%  Weight: 58.4 kg     Height: 5' (1.524 m)      Physical Exam Vitals and nursing note reviewed.  Constitutional:      General: She is not in acute distress. HENT:     Head: Normocephalic and atraumatic.  Cardiovascular:     Rate and Rhythm: Normal rate and regular rhythm.     Heart sounds: Normal heart sounds.  Pulmonary:     Effort: Pulmonary effort is normal.     Breath sounds: Normal breath sounds.  Abdominal:  Palpations: Abdomen is soft.     Tenderness: There is no abdominal tenderness.  Neurological:     Mental Status: Mental status is at baseline.     Labs on Admission: I have personally reviewed following labs and imaging studies  CBC: Recent Labs  Lab 07/09/21 2017  WBC 8.0  NEUTROABS 6.7  HGB 8.4*  HCT 26.3*  MCV 85.7  PLT 123XX123*   Basic Metabolic Panel: Recent Labs  Lab 07/09/21 2017  NA 141  K 3.2*  CL 110  CO2 21*  GLUCOSE 158*  BUN 34*  CREATININE 1.74*  CALCIUM 9.0   GFR: Estimated Creatinine Clearance: 21 mL/min (A) (by C-G formula based on SCr of 1.74 mg/dL (H)). Liver Function Tests: Recent Labs  Lab 07/09/21 2017  AST 38  ALT 26   ALKPHOS 75  BILITOT 1.3*  PROT 7.6  ALBUMIN 3.3*   No results for input(s): "LIPASE", "AMYLASE" in the last 168 hours. No results for input(s): "AMMONIA" in the last 168 hours. Coagulation Profile: Recent Labs  Lab 07/09/21 2017  INR 1.2   Cardiac Enzymes: No results for input(s): "CKTOTAL", "CKMB", "CKMBINDEX", "TROPONINI" in the last 168 hours. BNP (last 3 results) No results for input(s): "PROBNP" in the last 8760 hours. HbA1C: No results for input(s): "HGBA1C" in the last 72 hours. CBG: No results for input(s): "GLUCAP" in the last 168 hours. Lipid Profile: No results for input(s): "CHOL", "HDL", "LDLCALC", "TRIG", "CHOLHDL", "LDLDIRECT" in the last 72 hours. Thyroid Function Tests: No results for input(s): "TSH", "T4TOTAL", "FREET4", "T3FREE", "THYROIDAB" in the last 72 hours. Anemia Panel: No results for input(s): "VITAMINB12", "FOLATE", "FERRITIN", "TIBC", "IRON", "RETICCTPCT" in the last 72 hours. Urine analysis:    Component Value Date/Time   COLORURINE AMBER (A) 07/09/2021 2017   APPEARANCEUR CLOUDY (A) 07/09/2021 2017   LABSPEC 1.019 07/09/2021 2017   PHURINE 5.0 07/09/2021 2017   GLUCOSEU NEGATIVE 07/09/2021 2017   HGBUR LARGE (A) 07/09/2021 2017   BILIRUBINUR NEGATIVE 07/09/2021 2017   KETONESUR NEGATIVE 07/09/2021 2017   PROTEINUR 100 (A) 07/09/2021 2017   NITRITE NEGATIVE 07/09/2021 2017   LEUKOCYTESUR NEGATIVE 07/09/2021 2017    Radiological Exams on Admission: CT ABDOMEN PELVIS WO CONTRAST  Result Date: 07/09/2021 CLINICAL DATA:  Diarrhea dairrhea, fever, sepsis of unknown origin EXAM: CT ABDOMEN AND PELVIS WITHOUT CONTRAST TECHNIQUE: Multidetector CT imaging of the abdomen and pelvis was performed following the standard protocol without IV contrast. RADIATION DOSE REDUCTION: This exam was performed according to the departmental dose-optimization program which includes automated exposure control, adjustment of the mA and/or kV according to patient size  and/or use of iterative reconstruction technique. COMPARISON:  None Available. FINDINGS: Lower chest: Trace bilateral pleural effusions. Marked cardiomegaly. Small pericardial effusion. Hypoattenuation of the cardiac blood pool is in keeping with at least moderate anemia. Hepatobiliary: Cholelithiasis without pericholecystic inflammatory change. Liver unremarkable. No definite intra or extrahepatic biliary ductal dilation on this noncontrast examination. Pancreas: Unremarkable Spleen: Unremarkable Adrenals/Urinary Tract: Adrenal glands are unremarkable. Kidneys are normal, without renal calculi, focal lesion, or hydronephrosis. Bladder is unremarkable. Stomach/Bowel: The stomach, small bowel, and large bowel are unremarkable on this noncontrast examination. The appendix is not clearly identified, however, no secondary signs of appendicitis are seen within the right lower quadrant of the abdomen. Trace ascites. No free intraperitoneal gas. Vascular/Lymphatic: Aortic atherosclerosis. No enlarged abdominal or pelvic lymph nodes. 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 Pelvic organs are otherwise unremarkable. Other: No abdominal wall hernia. Musculoskeletal: No  acute bone abnormality. Degenerative changes are seen within the lumbar spine. IMPRESSION: No acute intra-abdominal pathology identified. No definite radiographic explanation for the patient's reported symptoms. Marked cardiomegaly. Moderate anemia. Small pericardial effusion, trace effusions, and trace ascites may reflect changes of cardiogenic failure and/or anasarca. Cholelithiasis Aortic Atherosclerosis (ICD10-I70.0). Electronically Signed   By: Fidela Salisbury M.D.   On: 07/09/2021 22:18   DG Chest Port 1 View  Result Date: 07/09/2021 CLINICAL DATA:  Question sepsis EXAM: PORTABLE CHEST 1 VIEW COMPARISON:  07/29/2020 FINDINGS: Cardiac silhouette is markedly enlarged. No change from comparison exam. Central venous  congestion is mild. No pleural fluid. No pneumothorax. IMPRESSION: Marked cardiomegaly not changed from comparison exam. No acute findings. Electronically Signed   By: Suzy Bouchard M.D.   On: 07/09/2021 20:59     Data Reviewed: Relevant notes from primary care and specialist visits, past discharge summaries as available in EHR, including Care Everywhere. Prior diagnostic testing as pertinent to current admission diagnoses Updated medications and problem lists for reconciliation ED course, including vitals, labs, imaging, treatment and response to treatment Triage notes, nursing and pharmacy notes and ED provider's notes Notable results as noted in HPI   Assessment and Plan: * Sepsis ([place]) Source unknown Continue sepsis fluids Continue cefepime Vanco and Flagyl Follow procalcitonin, cultures stool of diarrhea Follow blood cultures  Acute renal failure superimposed on stage 3a chronic kidney disease ([place]) IV hydration and monitor renal function Avoid nephrotoxins and renally dose all meds  Anemia Hemoglobin 8.4.  Most recent was 12 about a year prior Serial H&H Anemia panel  Generalized weakness Possibly multifactorial and related to generalized frailty, acute illness and symptomatic anemia Treat as outlined above PT consult  Hypertension Continue carvedilol  Chronic systolic CHF (congestive heart failure) (H[facility]) Cardiomegaly  Compensated Continue Lasix and carvedilol.  Monitor for overload given IV fluid resuscitation        DVT prophylaxis: Lovenox  Consults: none  Advance Care Planning:   Code Status: Prior   Family Communication: none  Disposition Plan: Back to previous home environment  Severity of Illness: The appropriate patient status for this patient is INPATIENT. Inpatient status is judged to be reasonable and necessary in order to provide the required intensity of service to ensure the patient's safety. The patient's presenting symptoms, physical  exam findings, and initial radiographic and laboratory data in the context of their chronic comorbidities is felt to place them at high risk for further clinical deterioration. Furthermore, it is not anticipated that the patient will be medically stable for discharge from the hospital within 2 midnights of admission.   * I certify that at the point of admission it is my clinical judgment that the patient will require inpatient hospital care spanning beyond 2 midnights from the point of admission due to high intensity of service, high risk for further deterioration and high frequency of surveillance required.*  Author: Athena Masse, MD 07/09/2021 10:54 PM  For on call review [email or web address].

## 2021-07-10 ENCOUNTER — Inpatient Hospital Stay
Admit: 2021-07-10 | Discharge: 2021-07-10 | Disposition: A | Discharge status: Home / Self Care | Payer: Medicare Other | Attending: Osteopathic Medicine | Admitting: Osteopathic Medicine

## 2021-07-10 DIAGNOSIS — B955 Unspecified streptococcus as the cause of diseases classified elsewhere: Secondary | ICD-10-CM

## 2021-07-10 DIAGNOSIS — I10 Essential (primary) hypertension: Secondary | ICD-10-CM

## 2021-07-10 DIAGNOSIS — N179 Acute kidney failure, unspecified: Secondary | ICD-10-CM | POA: Diagnosis not present

## 2021-07-10 DIAGNOSIS — R531 Weakness: Secondary | ICD-10-CM

## 2021-07-10 DIAGNOSIS — R6520 Severe sepsis without septic shock: Secondary | ICD-10-CM | POA: Diagnosis not present

## 2021-07-10 DIAGNOSIS — D649 Anemia, unspecified: Secondary | ICD-10-CM | POA: Diagnosis not present

## 2021-07-10 DIAGNOSIS — E43 Unspecified severe protein-calorie malnutrition: Secondary | ICD-10-CM

## 2021-07-10 DIAGNOSIS — I5022 Chronic systolic (congestive) heart failure: Secondary | ICD-10-CM

## 2021-07-10 DIAGNOSIS — A419 Sepsis, unspecified organism: Secondary | ICD-10-CM | POA: Diagnosis not present

## 2021-07-10 DIAGNOSIS — N1831 Chronic kidney disease, stage 3a: Secondary | ICD-10-CM

## 2021-07-10 LAB — BLOOD CULTURE ID PANEL (REFLEXED) - BCID2

## 2021-07-10 LAB — C DIFFICILE QUICK SCREEN W PCR REFLEX
C Diff antigen: NEGATIVE
C Diff interpretation: NOT DETECTED
C Diff toxin: NEGATIVE

## 2021-07-10 LAB — CBC
HCT: 24 % — ABNORMAL LOW (ref 36.0–46.0)
Hemoglobin: 8.1 g/dL — ABNORMAL LOW (ref 12.0–15.0)
MCH: 28 pg (ref 26.0–34.0)
MCHC: 33.8 g/dL (ref 30.0–36.0)
MCV: 83 fL (ref 80.0–100.0)
Platelets: 92 10*3/uL — ABNORMAL LOW (ref 150–400)
RBC: 2.89 MIL/uL — ABNORMAL LOW (ref 3.87–5.11)
RDW: 13.4 % (ref 11.5–15.5)
WBC: 7.8 10*3/uL (ref 4.0–10.5)
nRBC: 0 % (ref 0.0–0.2)

## 2021-07-10 LAB — GASTROINTESTINAL PANEL BY PCR, STOOL (REPLACES STOOL CULTURE)

## 2021-07-10 LAB — ECHOCARDIOGRAM COMPLETE
AR max vel: 1.22 cm2
AV Area VTI: 1.33 cm2
AV Area mean vel: 1.31 cm2
AV Mean grad: 12 mmHg
AV Peak grad: 23 mmHg
Ao pk vel: 2.4 m/s
Area-P 1/2: 3.24 cm2
Calc EF: 29.5 %
Height: 60 in
MV VTI: 0.91 cm2
S' Lateral: 4.88 cm
Single Plane A2C EF: 34.7 %
Single Plane A4C EF: 29.8 %
Weight: 2059.2 oz

## 2021-07-10 LAB — PROCALCITONIN: Procalcitonin: 1.65 ng/mL

## 2021-07-10 LAB — CORTISOL-AM, BLOOD: Cortisol - AM: 21.1 ug/dL (ref 6.7–22.6)

## 2021-07-10 LAB — CREATININE, SERUM
Creatinine, Ser: 1.23 mg/dL — ABNORMAL HIGH (ref 0.44–1.00)
GFR, Estimated: 45 mL/min — ABNORMAL LOW (ref >60)

## 2021-07-10 MED ORDER — VANCOMYCIN HCL 750 MG/150ML IV SOLN: 750.0000 mg | INTRAVENOUS | Status: DC

## 2021-07-10 MED ORDER — ONDANSETRON HCL 4 MG PO TABS: 4.0000 mg | ORAL_TABLET | Freq: Four times a day (QID) | ORAL | Status: DC | PRN

## 2021-07-10 MED ORDER — POTASSIUM CHLORIDE CRYS ER 10 MEQ PO TBCR
10.0000 meq | EXTENDED_RELEASE_TABLET | Freq: Every day | ORAL | Status: DC
  Administered 2021-07-10 – 2021-07-13 (×4): 10 meq via ORAL
  Filled 2021-07-10 (×4): qty 1

## 2021-07-10 MED ORDER — PRAVASTATIN SODIUM 20 MG PO TABS
40.0000 mg | ORAL_TABLET | Freq: Every day | ORAL | Status: DC
  Administered 2021-07-10 – 2021-07-13 (×4): 40 mg via ORAL
  Filled 2021-07-10 (×4): qty 2

## 2021-07-10 MED ORDER — ACETAMINOPHEN 650 MG RE SUPP: 650.0000 mg | Freq: Four times a day (QID) | RECTAL | Status: DC | PRN

## 2021-07-10 MED ORDER — SODIUM CHLORIDE 0.9 % IV SOLN
2.0000 g | INTRAVENOUS | Status: DC
  Administered 2021-07-10 – 2021-07-13 (×4): 2 g via INTRAVENOUS
  Filled 2021-07-10 (×2): qty 20
  Filled 2021-07-10: qty 2
  Filled 2021-07-10: qty 20

## 2021-07-10 MED ORDER — SODIUM CHLORIDE 0.9 % IV SOLN
2.0000 g | INTRAVENOUS | Status: DC
  Filled 2021-07-10: qty 12.5

## 2021-07-10 MED ORDER — FUROSEMIDE 20 MG PO TABS
20.0000 mg | ORAL_TABLET | Freq: Every day | ORAL | Status: DC
Start: 2021-07-10 — End: 2021-07-11
  Administered 2021-07-10: 20 mg via ORAL
  Filled 2021-07-10: qty 1

## 2021-07-10 MED ORDER — ACETAMINOPHEN 325 MG PO TABS: 650.0000 mg | ORAL_TABLET | Freq: Four times a day (QID) | ORAL | Status: DC | PRN

## 2021-07-10 MED ORDER — ONDANSETRON HCL 4 MG/2ML IJ SOLN: 4.0000 mg | Freq: Four times a day (QID) | INTRAMUSCULAR | Status: DC | PRN

## 2021-07-10 MED ORDER — ENOXAPARIN SODIUM 30 MG/0.3ML IJ SOSY
30.0000 mg | PREFILLED_SYRINGE | INTRAMUSCULAR | Status: DC
  Administered 2021-07-10 – 2021-07-11 (×2): 30 mg via SUBCUTANEOUS
  Filled 2021-07-10 (×2): qty 0.3

## 2021-07-10 MED ORDER — FUROSEMIDE 10 MG/ML IJ SOLN
40.0000 mg | Freq: Once | INTRAMUSCULAR | Status: AC
  Administered 2021-07-10: 40 mg via INTRAVENOUS
  Filled 2021-07-10: qty 4

## 2021-07-10 MED ORDER — ASPIRIN 81 MG PO TBEC
81.0000 mg | DELAYED_RELEASE_TABLET | Freq: Every day | ORAL | Status: DC
  Administered 2021-07-10 – 2021-07-13 (×4): 81 mg via ORAL
  Filled 2021-07-10 (×4): qty 1

## 2021-07-10 MED ORDER — METRONIDAZOLE 500 MG/100ML IV SOLN
500.0000 mg | Freq: Two times a day (BID) | INTRAVENOUS | Status: DC
  Administered 2021-07-10: 500 mg via INTRAVENOUS
  Filled 2021-07-10 (×2): qty 100

## 2021-07-10 MED ORDER — CARVEDILOL 25 MG PO TABS
25.0000 mg | ORAL_TABLET | Freq: Two times a day (BID) | ORAL | Status: DC
  Administered 2021-07-10 – 2021-07-13 (×7): 25 mg via ORAL
  Filled 2021-07-10 (×7): qty 1

## 2021-07-10 NOTE — Progress Notes (Signed)
PHARMACY - PHYSICIAN COMMUNICATION CRITICAL VALUE ALERT - BLOOD CULTURE IDENTIFICATION (BCID)  Zoe Boyle is an 81 y.o. female who presented to [facility] on 07/09/2021 with a chief complaint of weakness  Assessment:  6/11 blood cultures with GPC in 1 of 2 sets. BCID detects Group A streptococcus  Name of physician (or Provider) Contacted: Dr Lyn Hollingshead  Current antibiotics: Vancomycin/cefepime/metronidazole  Changes to prescribed antibiotics recommended:  Recommendations accepted by provider - requested ID consult  Results for orders placed or performed during the hospital encounter of 07/09/21  Blood Culture ID Panel (Reflexed) (Collected: 07/09/2021  8:17 PM)  Result Value Ref Range   Enterococcus faecalis NOT DETECTED NOT DETECTED   Enterococcus Faecium NOT DETECTED NOT DETECTED   Listeria monocytogenes NOT DETECTED NOT DETECTED   Staphylococcus species NOT DETECTED NOT DETECTED   Staphylococcus aureus (BCID) NOT DETECTED NOT DETECTED   Staphylococcus epidermidis NOT DETECTED NOT DETECTED   Staphylococcus lugdunensis NOT DETECTED NOT DETECTED   Streptococcus species DETECTED (A) NOT DETECTED   Streptococcus agalactiae NOT DETECTED NOT DETECTED   Streptococcus pneumoniae NOT DETECTED NOT DETECTED   Streptococcus pyogenes DETECTED (A) NOT DETECTED   A.calcoaceticus-baumannii NOT DETECTED NOT DETECTED   Bacteroides fragilis NOT DETECTED NOT DETECTED   Enterobacterales NOT DETECTED NOT DETECTED   Enterobacter cloacae complex NOT DETECTED NOT DETECTED   Escherichia coli NOT DETECTED NOT DETECTED   Klebsiella aerogenes NOT DETECTED NOT DETECTED   Klebsiella oxytoca NOT DETECTED NOT DETECTED   Klebsiella pneumoniae NOT DETECTED NOT DETECTED   Proteus species NOT DETECTED NOT DETECTED   Salmonella species NOT DETECTED NOT DETECTED   Serratia marcescens NOT DETECTED NOT DETECTED   Haemophilus influenzae NOT DETECTED NOT DETECTED   Neisseria meningitidis NOT DETECTED NOT DETECTED    Pseudomonas aeruginosa NOT DETECTED NOT DETECTED   Stenotrophomonas maltophilia NOT DETECTED NOT DETECTED   Candida albicans NOT DETECTED NOT DETECTED   Candida auris NOT DETECTED NOT DETECTED   Candida glabrata NOT DETECTED NOT DETECTED   Candida krusei NOT DETECTED NOT DETECTED   Candida parapsilosis NOT DETECTED NOT DETECTED   Candida tropicalis NOT DETECTED NOT DETECTED   Cryptococcus neoformans/gattii NOT DETECTED NOT DETECTED    Juliette Alcide, PharmD, BCPS, BCIDP Work Cell: [phone] 07/10/2021 1:22 PM

## 2021-07-10 NOTE — Progress Notes (Signed)
PROGRESS NOTE    Zoe Boyle  [id number] DOB: 1940/06/03  DOA: 07/09/2021 Date of Service: 07/10/21 PCP: Anita     Brief Narrative / Hospital Course:  Zoe Boyle is a 81 y.o. female with medical history significant for Systolic heart failure, last EF 30 to 35% December 2021, CKD 3A, HTN, who presented by EMS from home 07/09/2021 for evaluation of a several day history of generalized weakness, with urinary and fecal incontinence but without diarrhea.  EMS reported temp of 103.  Patient denies cough or shortness of breath, nausea, vomiting or abdominal pain.  Denies headache 06/11:  ED VS 103.32F, HR 106, RR 24, BP 101/58, O2 100 RA. WBC 8, lactic 1.2, Hgb 8.4, Ct 1.84 (bl 1.16), K 3.2, UA kg Hgb no WBC, CXR nonacute, CT C/A/P nonacute no findings to explain SIRS/sepsis. Started on IV fluids, cefepime, vancomycin, metronidazole.  06/12: VS improved, sepsis criteria resolved, Cr improving 1.3. Stool cultures mentioned in H&P but couldn't see orders placed so will place orders and I alerted RN. ID following for strep bacteremia likely d/t exposure to recently sick granddaughter. Will need 7 days IV abx, daughter feels comfortable to administer this at home and would like to avoid SNF if at all possible. D/c IV fluids and started diuresis given hx CHF, ordered Echo given significant cardiac enlargement on CXR and recent aggressive IV fluids resuscitation. Dental pain, mech soft diet ordered   Consultants:  Infectious disease  Procedures: none    Subjective: Patient reports feeling better today, still tired. Daughter concerned about facial edema L eyelid, pt has been having dental pain but this is not new. No rashes. No persistent loose stool.      ASSESSMENT & PLAN:   Principal Problem:   Sepsis ([place]) Active Problems:   Acute renal failure superimposed on stage 3a chronic kidney disease (HCC)   Anemia   Chronic systolic CHF (congestive heart failure) (HCC)    Hypertension   Generalized weakness   Sepsis ([place]) Sepsis resolved as of 07/10/21  Strep bacteremia Blood cx (+) group A strep, likely d/t exposure from granddaughter who was dx w/ strep throat past few weeks Stopped sepsis fluids as sepsis criteria have resolved cefepime Vanco and Flagyl --> changing per ID recs  Follow procalcitonin, stool studies, blood cultures  Acute renal failure superimposed on stage 3a chronic kidney disease (HCC) Improved w/ IV fluids Stopped IV hydration  monitor renal function Avoid nephrotoxins and renally dose all meds  Chronic systolic CHF (congestive heart failure) (HCC) Cardiomegaly  Appears grossly compensated Continue Lasix and carvedilol.   Echo to monitor for overload given IV fluid resuscitation  Anemia Hemoglobin 8.4.  Most recent was 66 about a year prior Serial H&H Anemia panel  Hypertension Continue carvedilol  Generalized weakness Possibly multifactorial and related to generalized frailty, acute illness and symptomatic anemia Treat as outlined above PT consult    DVT prophylaxis: Lovenox Code Status: FULL Family Communication: daughter at bedside on rounds  Disposition Plan / TOC needs: PT eval pending, may need HH/SNF daughter would much prefer home and is able to administer daily IV antibiotics w/ education  Barriers to discharge / significant pending items: continued IV treatments and medical workup see A/P             Objective: Vitals:   07/09/21 2211 07/09/21 2346 07/10/21 0052 07/10/21 0454  BP: 108/71 113/78 107/70 105/82  Pulse: 87 82 87 78  Resp: (!) 23 13 17 18   Temp:  98.9 F (37.2 C) 98.5 F (36.9 C) 99 F (37.2 C)  TempSrc:   Oral Oral  SpO2: 97% 99% 99% 97%  Weight:      Height:        Intake/Output Summary (Last 24 hours) at 07/10/2021 0725 Last data filed at 07/10/2021 0600 Gross per 24 hour  Intake 1123.38 ml  Output --  Net 1123.38 ml   Filed Weights   07/09/21 2021  Weight:  58.4 kg    Examination:  Constitutional:  VS as above General Appearance: alert, well-developed, well-nourished, NAD Eyes: Normal conjunctive, non-icteric sclera Edema L eyelid upper Ears, Nose, Mouth, Throat: Poor dentiion No abscess / gingivitis notes  Neck: No masses, trachea midline No thyroid enlargement/tenderness/mass appreciated Respiratory: Fair respiratory effort, no increased WOB Breath sounds normal, no wheeze/rhonchi/rales Cardiovascular: S1/S2 normal, heart sounds distant  No lower extremity edema Gastrointestinal: Nontender, no masses No hepatomegaly, no splenomegaly No hernia appreciated Musculoskeletal:  No clubbing/cyanosis of digits Neurological: No cranial nerve deficit on limited exam Motor and sensation intact and symmetric Psychiatric: Normal judgment/insight Normal mood and affect Skin:  Complete assessment except where covered by sacral pad, no rash       Scheduled Medications:   aspirin EC  81 mg Oral Daily   carvedilol  25 mg Oral BID WC   enoxaparin (LOVENOX) injection  30 mg Subcutaneous Q24H   furosemide  20 mg Oral Daily   potassium chloride  10 mEq Oral Daily   pravastatin  40 mg Oral Daily    Continuous Infusions:  ceFEPime (MAXIPIME) IV     lactated ringers 150 mL/hr at 07/10/21 0448   metronidazole     [START ON 07/11/2021] vancomycin      PRN Medications:  acetaminophen **OR** acetaminophen, ondansetron **OR** ondansetron (ZOFRAN) IV  Antimicrobials:  Anti-infectives (From admission, onward)    Start     Dose/Rate Route Frequency Ordered Stop   07/11/21 2100  vancomycin (VANCOREADY) IVPB 750 mg/150 mL        750 mg 150 mL/hr over 60 Minutes Intravenous Every 48 hours 07/10/21 0350     07/10/21 2100  ceFEPIme (MAXIPIME) 2 g in sodium chloride 0.9 % 100 mL IVPB        2 g 200 mL/hr over 30 Minutes Intravenous Every 24 hours 07/10/21 0347     07/10/21 0900  metroNIDAZOLE (FLAGYL) IVPB 500 mg        500 mg 100  mL/hr over 60 Minutes Intravenous Every 12 hours 07/10/21 0306 07/17/21 0859   07/09/21 2030  ceFEPIme (MAXIPIME) 2 g in sodium chloride 0.9 % 100 mL IVPB        2 g 200 mL/hr over 30 Minutes Intravenous  Once 07/09/21 2017 07/09/21 2159   07/09/21 2030  metroNIDAZOLE (FLAGYL) IVPB 500 mg        500 mg 100 mL/hr over 60 Minutes Intravenous  Once 07/09/21 2017 07/09/21 2200   07/09/21 2030  vancomycin (VANCOCIN) IVPB 1000 mg/200 mL premix        1,000 mg 200 mL/hr over 60 Minutes Intravenous  Once 07/09/21 2017 07/09/21 2207       Data Reviewed: I have personally reviewed following labs and imaging studies  CBC: Recent Labs  Lab 07/09/21 2017 07/10/21 0630  WBC 8.0 7.8  NEUTROABS 6.7  --   HGB 8.4* 8.1*  HCT 26.3* 24.0*  MCV 85.7 83.0  PLT 100* 92*   Basic Metabolic Panel: Recent Labs  Lab 07/09/21 2017 07/10/21  0630  NA 141  --   K 3.2*  --   CL 110  --   CO2 21*  --   GLUCOSE 158*  --   BUN 34*  --   CREATININE 1.74* 1.23*  CALCIUM 9.0  --    GFR: Estimated Creatinine Clearance: 29.7 mL/min (A) (by C-G formula based on SCr of 1.23 mg/dL (H)). Liver Function Tests: Recent Labs  Lab 07/09/21 2017  AST 38  ALT 26  ALKPHOS 75  BILITOT 1.3*  PROT 7.6  ALBUMIN 3.3*   No results for input(s): "LIPASE", "AMYLASE" in the last 168 hours. No results for input(s): "AMMONIA" in the last 168 hours. Coagulation Profile: Recent Labs  Lab 07/09/21 2017  INR 1.2   Cardiac Enzymes: No results for input(s): "CKTOTAL", "CKMB", "CKMBINDEX", "TROPONINI" in the last 168 hours. BNP (last 3 results) No results for input(s): "PROBNP" in the last 8760 hours. HbA1C: No results for input(s): "HGBA1C" in the last 72 hours. CBG: No results for input(s): "GLUCAP" in the last 168 hours. Lipid Profile: No results for input(s): "CHOL", "HDL", "LDLCALC", "TRIG", "CHOLHDL", "LDLDIRECT" in the last 72 hours. Thyroid Function Tests: No results for input(s): "TSH", "T4TOTAL",  "FREET4", "T3FREE", "THYROIDAB" in the last 72 hours. Anemia Panel: No results for input(s): "VITAMINB12", "FOLATE", "FERRITIN", "TIBC", "IRON", "RETICCTPCT" in the last 72 hours. Urine analysis:    Component Value Date/Time   COLORURINE AMBER (A) 07/09/2021 2017   APPEARANCEUR CLOUDY (A) 07/09/2021 2017   LABSPEC 1.019 07/09/2021 2017   PHURINE 5.0 07/09/2021 2017   GLUCOSEU NEGATIVE 07/09/2021 2017   HGBUR LARGE (A) 07/09/2021 2017   BILIRUBINUR NEGATIVE 07/09/2021 2017   KETONESUR NEGATIVE 07/09/2021 2017   PROTEINUR 100 (A) 07/09/2021 2017   NITRITE NEGATIVE 07/09/2021 2017   LEUKOCYTESUR NEGATIVE 07/09/2021 2017   Sepsis Labs: @LABRCNTIP (procalcitonin:4,lacticidven:4)  Recent Results (from the past 240 hour(s))  Resp Panel by RT-PCR (Flu A&B, Covid) Anterior Nasal Swab     Status: None   Collection Time: 07/09/21  8:17 PM   Specimen: Anterior Nasal Swab  Result Value Ref Range Status   SARS Coronavirus 2 by RT PCR NEGATIVE NEGATIVE Final    Comment: (NOTE) SARS-CoV-2 target nucleic acids are NOT DETECTED.  The SARS-CoV-2 RNA is generally detectable in upper respiratory specimens during the acute phase of infection. The lowest concentration of SARS-CoV-2 viral copies this assay can detect is 138 copies/mL. A negative result does not preclude SARS-Cov-2 infection and should not be used as the sole basis for treatment or other patient management decisions. A negative result may occur with  improper specimen collection/handling, submission of specimen other than nasopharyngeal swab, presence of viral mutation(s) within the areas targeted by this assay, and inadequate number of viral copies(<138 copies/mL). A negative result must be combined with clinical observations, patient history, and epidemiological information. The expected result is Negative.  Fact Sheet for Patients:  EntrepreneurPulse.com.au  Fact Sheet for Healthcare Providers:   IncredibleEmployment.be  This test is no t yet approved or cleared by the [place] FDA and  has been authorized for detection and/or diagnosis of SARS-CoV-2 by FDA under an Emergency Use Authorization (EUA). This EUA will remain  in effect (meaning this test can be used) for the duration of the COVID-19 declaration under Section 564(b)(1) of the Act, 21 U.S.C.section 360bbb-3(b)(1), unless the authorization is terminated  or revoked sooner.       Influenza A by PCR NEGATIVE NEGATIVE Final   Influenza B by PCR NEGATIVE  NEGATIVE Final    Comment: (NOTE) The Xpert Xpress SARS-CoV-2/FLU/RSV plus assay is intended as an aid in the diagnosis of influenza from Nasopharyngeal swab specimens and should not be used as a sole basis for treatment. Nasal washings and aspirates are unacceptable for Xpert Xpress SARS-CoV-2/FLU/RSV testing.  Fact Sheet for Patients: EntrepreneurPulse.com.au  Fact Sheet for Healthcare Providers: IncredibleEmployment.be  This test is not yet approved or cleared by the [place] FDA and has been authorized for detection and/or diagnosis of SARS-CoV-2 by FDA under an Emergency Use Authorization (EUA). This EUA will remain in effect (meaning this test can be used) for the duration of the COVID-19 declaration under Section 564(b)(1) of the Act, 21 U.S.C. section 360bbb-3(b)(1), unless the authorization is terminated or revoked.  Performed at [facility], [address], [place], [place] [address]   Blood Culture (routine x 2)     Status: None (Preliminary result)   Collection Time: 07/09/21  8:17 PM   Specimen: BLOOD  Result Value Ref Range Status   Specimen Description BLOOD RFOA  Final   Special Requests BOTTLES DRAWN AEROBIC AND ANAEROBIC BCAV  Final   Culture   Final    NO GROWTH < 12 HOURS Performed at [facility], [address] [place], [place] [address]    Report  Status PENDING  Incomplete  Blood Culture (routine x 2)     Status: None (Preliminary result)   Collection Time: 07/09/21  8:22 PM   Specimen: BLOOD  Result Value Ref Range Status   Specimen Description BLOOD LFOA  Final   Special Requests BOTTLES DRAWN AEROBIC AND ANAEROBIC BCAV  Final   Culture   Final    NO GROWTH < 12 HOURS Performed at [facility], [address], [place], [place] [address]    Report Status PENDING  Incomplete         Radiology Studies last 96 hours: CT ABDOMEN PELVIS WO CONTRAST  Result Date: 07/09/2021 CLINICAL DATA:  Diarrhea dairrhea, fever, sepsis of unknown origin EXAM: CT ABDOMEN AND PELVIS WITHOUT CONTRAST TECHNIQUE: Multidetector CT imaging of the abdomen and pelvis was performed following the standard protocol without IV contrast. RADIATION DOSE REDUCTION: This exam was performed according to the departmental dose-optimization program which includes automated exposure control, adjustment of the mA and/or kV according to patient size and/or use of iterative reconstruction technique. COMPARISON:  None Available. FINDINGS: Lower chest: Trace bilateral pleural effusions. Marked cardiomegaly. Small pericardial effusion. Hypoattenuation of the cardiac blood pool is in keeping with at least moderate anemia. Hepatobiliary: Cholelithiasis without pericholecystic inflammatory change. Liver unremarkable. No definite intra or extrahepatic biliary ductal dilation on this noncontrast examination. Pancreas: Unremarkable Spleen: Unremarkable Adrenals/Urinary Tract: Adrenal glands are unremarkable. Kidneys are normal, without renal calculi, focal lesion, or hydronephrosis. Bladder is unremarkable. Stomach/Bowel: The stomach, small bowel, and large bowel are unremarkable on this noncontrast examination. The appendix is not clearly identified, however, no secondary signs of appendicitis are seen within the right lower quadrant of the abdomen. Trace ascites. No free  intraperitoneal gas. Vascular/Lymphatic: Aortic atherosclerosis. No enlarged abdominal or pelvic lymph nodes. 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 Pelvic organs are otherwise unremarkable. Other: No abdominal wall hernia. Musculoskeletal: No acute bone abnormality. Degenerative changes are seen within the lumbar spine. IMPRESSION: No acute intra-abdominal pathology identified. No definite radiographic explanation for the patient's reported symptoms. Marked cardiomegaly. Moderate anemia. Small pericardial effusion, trace effusions, and trace ascites may reflect changes of cardiogenic failure  and/or anasarca. Cholelithiasis Aortic Atherosclerosis (ICD10-I70.0). Electronically Signed   By: Fidela Salisbury M.D.   On: 07/09/2021 22:18   DG Chest Port 1 View  Result Date: 07/09/2021 CLINICAL DATA:  Question sepsis EXAM: PORTABLE CHEST 1 VIEW COMPARISON:  07/29/2020 FINDINGS: Cardiac silhouette is markedly enlarged. No change from comparison exam. Central venous congestion is mild. No pleural fluid. No pneumothorax. IMPRESSION: Marked cardiomegaly not changed from comparison exam. No acute findings. Electronically Signed   By: Suzy Bouchard M.D.   On: 07/09/2021 20:59            LOS: 1 day    Time spent: 35 min    Emeterio Reeve, DO [facility] 07/10/2021, 7:25 AM   Staff may message me via secure chat in [place]  but this may not receive immediate response,  please page for urgent matters!  If 7PM-7AM, please contact night-coverage [email or web address]  Dictation software was used to generate the above note. Typos may occur and escape review, as with typed/written notes. Please contact Dr Sheppard Coil directly for clarity if needed.

## 2021-07-10 NOTE — Consult Note (Signed)
NAME: Zoe Boyle  DOB: 12/01/1940  MRN: [id number]  Date/Time: 07/10/2021 2:39 PM  REQUESTING PROVIDER: Dr.Alexander Subjective:  REASON FOR CONSULT: Group A strep bacteremia ? Zoe Boyle is a 81 y.o. female with a history of CHF, HTN presents with weakness, poor appetite and not feeling well since Thursday PT lives with her daughter Pts grand daughter who is 3 yrs old recently  had strep throat and just finished amoxicillin. She was sharing bed with ehr grandmother   Past Medical History:  Diagnosis Date   Congestive heart failure (CHF) (HCC)    Hypertension     History reviewed. No pertinent surgical history.  Social History   Socioeconomic History   Marital status: Divorced    Spouse name: Not on file   Number of children: Not on file   Years of education: Not on file   Highest education level: Not on file  Occupational History   Not on file  Tobacco Use   Smoking status: Former   Smokeless tobacco: Never  Substance and Sexual Activity   Alcohol use: Not Currently   Drug use: Not Currently   Sexual activity: Not on file  Other Topics Concern   Not on file  Social History Narrative   Not on file   Social Determinants of Health   Financial Resource Strain: Not on file  Food Insecurity: Not on file  Transportation Needs: Not on file  Physical Activity: Not on file  Stress: Not on file  Social Connections: Not on file  Intimate Partner Violence: Not on file    Family History  Family history unknown: Yes  Mom died of old age in 64 yrs Allergies  Allergen Reactions   Lisinopril Swelling    TONGUE SWELLED   I? Current Facility-Administered Medications  Medication Dose Route Frequency Provider Last Rate Last Admin   acetaminophen (TYLENOL) tablet 650 mg  650 mg Oral Q6H PRN Andris Baumann, MD       Or   acetaminophen (TYLENOL) suppository 650 mg  650 mg Rectal Q6H PRN Andris Baumann, MD       aspirin EC tablet 81 mg  81 mg Oral Daily Lindajo Royal V, MD    81 mg at 07/10/21 0855   carvedilol (COREG) tablet 25 mg  25 mg Oral BID WC Lindajo Royal V, MD   25 mg at 07/10/21 0854   ceFEPIme (MAXIPIME) 2 g in sodium chloride 0.9 % 100 mL IVPB  2 g Intravenous Q24H Lindajo Royal V, MD       enoxaparin (LOVENOX) injection 30 mg  30 mg Subcutaneous Q24H Lindajo Royal V, MD   30 mg at 07/10/21 0855   furosemide (LASIX) tablet 20 mg  20 mg Oral Daily Andris Baumann, MD   20 mg at 07/10/21 7893   lactated ringers infusion   Intravenous Continuous Minna Antis, MD 150 mL/hr at 07/10/21 1317 New Bag at 07/10/21 1317   ondansetron (ZOFRAN) tablet 4 mg  4 mg Oral Q6H PRN Andris Baumann, MD       Or   ondansetron [facility]) injection 4 mg  4 mg Intravenous Q6H PRN Andris Baumann, MD       potassium chloride (KLOR-CON M) CR tablet 10 mEq  10 mEq Oral Daily Andris Baumann, MD   10 mEq at 07/10/21 0854   pravastatin (PRAVACHOL) tablet 40 mg  40 mg Oral Daily Andris Baumann, MD   40 mg at 07/10/21 [phone]  Abtx:  Anti-infectives (From admission, onward)    Start     Dose/Rate Route Frequency Ordered Stop   07/11/21 2100  vancomycin (VANCOREADY) IVPB 750 mg/150 mL  Status:  Discontinued        750 mg 150 mL/hr over 60 Minutes Intravenous Every 48 hours 07/10/21 0350 07/10/21 1439   07/10/21 2100  ceFEPIme (MAXIPIME) 2 g in sodium chloride 0.9 % 100 mL IVPB        2 g 200 mL/hr over 30 Minutes Intravenous Every 24 hours 07/10/21 0347     07/10/21 0900  metroNIDAZOLE (FLAGYL) IVPB 500 mg  Status:  Discontinued        500 mg 100 mL/hr over 60 Minutes Intravenous Every 12 hours 07/10/21 0306 07/10/21 1439   07/09/21 2030  ceFEPIme (MAXIPIME) 2 g in sodium chloride 0.9 % 100 mL IVPB        2 g 200 mL/hr over 30 Minutes Intravenous  Once 07/09/21 2017 07/09/21 2159   07/09/21 2030  metroNIDAZOLE (FLAGYL) IVPB 500 mg        500 mg 100 mL/hr over 60 Minutes Intravenous  Once 07/09/21 2017 07/09/21 2200   07/09/21 2030  vancomycin (VANCOCIN) IVPB 1000 mg/200  mL premix        1,000 mg 200 mL/hr over 60 Minutes Intravenous  Once 07/09/21 2017 07/09/21 2207       REVIEW OF SYSTEMS:  Const:  fever,  chills, negative weight loss Eyes: negative diplopia or visual changes, negative eye pain ENT: negative coryza, negative sore throat Resp: negative cough, hemoptysis, dyspnea Cards: negative for chest pain, palpitations, lower extremity edema GU: negative for frequency, dysuria and hematuria GI: Negative for abdominal pain, diarrhea, bleeding, constipation Skin: negative for rash and pruritus Heme: negative for easy bruising and gum/nose bleeding WU:JWJXBJYN, fatigue Neurolo:negative for headaches, dizziness, vertigo, memory problems  Psych: negative for feelings of anxiety, depression  Endocrine: negative for thyroid, diabetes Allergy/Immunology- negative for any medication or food allergies ? Pertinent Positives include : Objective:  VITALS:  BP 100/71   Pulse 75   Temp 97.7 F (36.5 C)   Resp 16   Ht 5' (1.524 m)   Wt 58.4 kg   SpO2 98%   BMI 25.13 kg/m   PHYSICAL EXAM:  General: Awake, responds to questions Head: Normocephalic, without obvious abnormality, atraumatic. Left side of the face swelling Eyes: Conjunctivae clear, anicteric sclerae. Pupils are equal ENT Nares normal. No drainage or sinus tenderness. Lips, mucosa, and tongue normal. No Thrush Left lower teeth missing Neck: Supple, symmetrical, no adenopathy, thyroid: non tender no carotid bruit and no JVD. Back: No CVA tenderness. Lungs: b/l air entry Heart: tachycardia- systolic murmur. Abdomen: Soft, non-tender,not distended. Bowel sounds normal. No masses Extremities: atraumatic, no cyanosis. No edema. No clubbing Skin: No rashes or lesions. Or bruising Lymph: Cervical, supraclavicular normal. Neurologic: Grossly non-focal Pertinent Labs Lab Results CBC    Component Value Date/Time   WBC 7.8 07/10/2021 0630   RBC 2.89 (L) 07/10/2021 0630   HGB 8.1 (L)  07/10/2021 0630   HCT 24.0 (L) 07/10/2021 0630   PLT 92 (L) 07/10/2021 0630   MCV 83.0 07/10/2021 0630   MCH 28.0 07/10/2021 0630   MCHC 33.8 07/10/2021 0630   RDW 13.4 07/10/2021 0630   LYMPHSABS 0.6 (L) 07/09/2021 2017   MONOABS 0.4 07/09/2021 2017   EOSABS 0.0 07/09/2021 2017   BASOSABS 0.0 07/09/2021 2017       Latest Ref Rng & Units 07/10/2021  6:30 AM 07/09/2021    8:17 PM 07/29/2020    3:18 AM  CMP  Glucose 70 - 99 mg/dL  161158  096139   BUN 8 - 23 mg/dL  34  16   Creatinine 0.450.44 - 1.00 mg/dL 4.091.23  8.111.74  9.141.16   Sodium 135 - 145 mmol/L  141  140   Potassium 3.5 - 5.1 mmol/L  3.2  3.3   Chloride 98 - 111 mmol/L  110  108   CO2 22 - 32 mmol/L  21  23   Calcium 8.9 - 10.3 mg/dL  9.0  9.0   Total Protein 6.5 - 8.1 g/dL  7.6  7.1   Total Bilirubin 0.3 - 1.2 mg/dL  1.3  1.2   Alkaline Phos 38 - 126 U/L  75  40   AST 15 - 41 U/L  38  34   ALT 0 - 44 U/L  26  18       Microbiology: Recent Results (from the past 240 hour(s))  Resp Panel by RT-PCR (Flu A&B, Covid) Anterior Nasal Swab     Status: None   Collection Time: 07/09/21  8:17 PM   Specimen: Anterior Nasal Swab  Result Value Ref Range Status   SARS Coronavirus 2 by RT PCR NEGATIVE NEGATIVE Final    Comment: (NOTE) SARS-CoV-2 target nucleic acids are NOT DETECTED.  The SARS-CoV-2 RNA is generally detectable in upper respiratory specimens during the acute phase of infection. The lowest concentration of SARS-CoV-2 viral copies this assay can detect is 138 copies/mL. A negative result does not preclude SARS-Cov-2 infection and should not be used as the sole basis for treatment or other patient management decisions. A negative result may occur with  improper specimen collection/handling, submission of specimen other than nasopharyngeal swab, presence of viral mutation(s) within the areas targeted by this assay, and inadequate number of viral copies(<138 copies/mL). A negative result must be combined with clinical  observations, patient history, and epidemiological information. The expected result is Negative.  Fact Sheet for Patients:  BloggerCourse.comhttps://www.fda.gov/media/152166/download  Fact Sheet for Healthcare Providers:  SeriousBroker.ithttps://www.fda.gov/media/152162/download  This test is no t yet approved or cleared by the [place] FDA and  has been authorized for detection and/or diagnosis of SARS-CoV-2 by FDA under an Emergency Use Authorization (EUA). This EUA will remain  in effect (meaning this test can be used) for the duration of the COVID-19 declaration under Section 564(b)(1) of the Act, 21 U.S.C.section 360bbb-3(b)(1), unless the authorization is terminated  or revoked sooner.       Influenza A by PCR NEGATIVE NEGATIVE Final   Influenza B by PCR NEGATIVE NEGATIVE Final    Comment: (NOTE) The Xpert Xpress SARS-CoV-2/FLU/RSV plus assay is intended as an aid in the diagnosis of influenza from Nasopharyngeal swab specimens and should not be used as a sole basis for treatment. Nasal washings and aspirates are unacceptable for Xpert Xpress SARS-CoV-2/FLU/RSV testing.  Fact Sheet for Patients: BloggerCourse.comhttps://www.fda.gov/media/152166/download  Fact Sheet for Healthcare Providers: SeriousBroker.ithttps://www.fda.gov/media/152162/download  This test is not yet approved or cleared by the [place] FDA and has been authorized for detection and/or diagnosis of SARS-CoV-2 by FDA under an Emergency Use Authorization (EUA). This EUA will remain in effect (meaning this test can be used) for the duration of the COVID-19 declaration under Section 564(b)(1) of the Act, 21 U.S.C. section 360bbb-3(b)(1), unless the authorization is terminated or revoked.  Performed at [facility], [address] [place], [place] [id number]   Blood Culture (  routine x 2)     Status: None (Preliminary result)   Collection Time: 07/09/21  8:17 PM   Specimen: BLOOD  Result Value Ref Range Status   Specimen Description BLOOD RFOA   Final   Special Requests BOTTLES DRAWN AEROBIC AND ANAEROBIC BCAV  Final   Culture  Setup Time   Final    AEROBIC BOTTLE ONLY GRAM POSITIVE COCCI Organism ID to follow CRITICAL RESULT CALLED TO, READ BACK BY AND VERIFIED WITH: MORGAN HICKS ON 07/10/21@ 1257 RP  Performed at [facility], [address] [place], [place] [address]    Culture GRAM POSITIVE COCCI  Final   Report Status PENDING  Incomplete  Blood Culture ID Panel (Reflexed)     Status: Abnormal   Collection Time: 07/09/21  8:17 PM  Result Value Ref Range Status   Enterococcus faecalis NOT DETECTED NOT DETECTED Final   Enterococcus Faecium NOT DETECTED NOT DETECTED Final   Listeria monocytogenes NOT DETECTED NOT DETECTED Final   Staphylococcus species NOT DETECTED NOT DETECTED Final   Staphylococcus aureus (BCID) NOT DETECTED NOT DETECTED Final   Staphylococcus epidermidis NOT DETECTED NOT DETECTED Final   Staphylococcus lugdunensis NOT DETECTED NOT DETECTED Final   Streptococcus species DETECTED (A) NOT DETECTED Final    Comment: CRITICAL RESULT CALLED TO, READ BACK BY AND VERIFIED WITH: MORGAN HICKS ON 07/10/21  RP    Streptococcus agalactiae NOT DETECTED NOT DETECTED Final   Streptococcus pneumoniae NOT DETECTED NOT DETECTED Final   Streptococcus pyogenes DETECTED (A) NOT DETECTED Final    Comment: CRITICAL RESULT CALLED TO, READ BACK BY AND VERIFIED WITH: MORGAN HICKS ON 07/10/21  RP    A.calcoaceticus-baumannii NOT DETECTED NOT DETECTED Final   Bacteroides fragilis NOT DETECTED NOT DETECTED Final   Enterobacterales NOT DETECTED NOT DETECTED Final   Enterobacter cloacae complex NOT DETECTED NOT DETECTED Final   Escherichia coli NOT DETECTED NOT DETECTED Final   Klebsiella aerogenes NOT DETECTED NOT DETECTED Final   Klebsiella oxytoca NOT DETECTED NOT DETECTED Final   Klebsiella pneumoniae NOT DETECTED NOT DETECTED Final   Proteus species NOT DETECTED NOT DETECTED Final   Salmonella species NOT  DETECTED NOT DETECTED Final   Serratia marcescens NOT DETECTED NOT DETECTED Final   Haemophilus influenzae NOT DETECTED NOT DETECTED Final   Neisseria meningitidis NOT DETECTED NOT DETECTED Final   Pseudomonas aeruginosa NOT DETECTED NOT DETECTED Final   Stenotrophomonas maltophilia NOT DETECTED NOT DETECTED Final   Candida albicans NOT DETECTED NOT DETECTED Final   Candida auris NOT DETECTED NOT DETECTED Final   Candida glabrata NOT DETECTED NOT DETECTED Final   Candida krusei NOT DETECTED NOT DETECTED Final   Candida parapsilosis NOT DETECTED NOT DETECTED Final   Candida tropicalis NOT DETECTED NOT DETECTED Final   Cryptococcus neoformans/gattii NOT DETECTED NOT DETECTED Final    Comment: Performed at [facility], [address] [place], [place] [address]  Blood Culture (routine x 2)     Status: None (Preliminary result)   Collection Time: 07/09/21  8:22 PM   Specimen: BLOOD  Result Value Ref Range Status   Specimen Description BLOOD LFOA  Final   Special Requests BOTTLES DRAWN AEROBIC AND ANAEROBIC BCAV  Final   Culture   Final    NO GROWTH < 12 HOURS Performed at [facility], [address] [place], [place] [address]    Report Status PENDING  Incomplete    IMAGING RESULTS:  I have personally reviewed the films ? Impression/Recommendation ?  Group A streptococcus bacteremia No skin lesions Has a positive contact at home Change vanco/cefepime to ceftriaxone. Would do IV antibiotic minimum of 5-7 days and then switch to PO Amoxicillin  CHF - cardiomegaly- EF 25-30%? ?on frusemide, coreg  Anemia-   AKI on CKD ___________________________________________________ Discussed with patient, daughter requesting provider Note:  This document was prepared using Dragon voice recognition software and may include unintentional dictation errors.

## 2021-07-10 NOTE — Progress Notes (Signed)
Anticoagulation monitoring(Lovenox):  81 yo  female ordered Lovenox 40 mg Q24h    Filed Weights   07/09/21 2021  Weight: 58.4 kg (128 lb 11.2 oz)   BMI 25.13    Lab Results  Component Value Date   CREATININE 1.74 (H) 07/09/2021   CREATININE 1.16 (H) 07/29/2020   CREATININE 0.98 01/02/2020   Estimated Creatinine Clearance: 21 mL/min (A) (by C-G formula based on SCr of 1.74 mg/dL (H)). Hemoglobin & Hematocrit     Component Value Date/Time   HGB 8.4 (L) 07/09/2021 2017   HCT 26.3 (L) 07/09/2021 2017     Per Protocol for Patient with estCrcl < 30 ml/min and BMI < 40, will transition to Lovenox 30 mg Q24h.

## 2021-07-10 NOTE — Progress Notes (Signed)
Pharmacy Antibiotic Note  Zoe Boyle is a 81 y.o. female admitted on 07/09/2021 with sepsis.  Pharmacy has been consulted for Vancomycin, Cefepime dosing.  Plan: Cefepime 2 gm IV X 1 given in ED on 6/11 @ 2046. Cefepime 2 gm IV Q24H ordered to continue on 6/12 @ 2100.  Vancomycin 1 gm IV X 1 given in ED on 6/11 @ 2046. Vancomycin 750 mg IV Q48H ordered to start on 6/13 @ 2100.   AUC =445.3 Vanc trough = 11.3   Height: 5' (152.4 cm) Weight: 58.4 kg (128 lb 11.2 oz) IBW/kg (Calculated) : 45.5  Temp (24hrs), Avg:100.3 F (37.9 C), Min:98.5 F (36.9 C), Max:103.6 F (39.8 C)  Recent Labs  Lab 07/09/21 2017 07/09/21 2210  WBC 8.0  --   CREATININE 1.74*  --   LATICACIDVEN 1.4 1.2    Estimated Creatinine Clearance: 21 mL/min (A) (by C-G formula based on SCr of 1.74 mg/dL (H)).    Allergies  Allergen Reactions   Lisinopril Swelling    TONGUE SWELLED    Antimicrobials this admission:   >>    >>   Dose adjustments this admission:   Microbiology results:  BCx:   UCx:    Sputum:    MRSA PCR:   Thank you for allowing pharmacy to be a part of this patient's care.  Jhovani Griswold D 07/10/2021 3:51 AM

## 2021-07-11 DIAGNOSIS — R600 Localized edema: Secondary | ICD-10-CM

## 2021-07-11 DIAGNOSIS — B955 Unspecified streptococcus as the cause of diseases classified elsewhere: Secondary | ICD-10-CM | POA: Diagnosis not present

## 2021-07-11 DIAGNOSIS — N179 Acute kidney failure, unspecified: Secondary | ICD-10-CM | POA: Diagnosis not present

## 2021-07-11 DIAGNOSIS — A419 Sepsis, unspecified organism: Secondary | ICD-10-CM | POA: Diagnosis not present

## 2021-07-11 DIAGNOSIS — I5022 Chronic systolic (congestive) heart failure: Secondary | ICD-10-CM | POA: Diagnosis not present

## 2021-07-11 DIAGNOSIS — D649 Anemia, unspecified: Secondary | ICD-10-CM | POA: Diagnosis not present

## 2021-07-11 LAB — BASIC METABOLIC PANEL
Anion gap: 5 (ref 5–15)
Anion gap: 10 (ref 5–15)
BUN: 31 mg/dL — ABNORMAL HIGH (ref 8–23)
BUN: 33 mg/dL — ABNORMAL HIGH (ref 8–23)
CO2: 24 mmol/L (ref 22–32)
CO2: 24 mmol/L (ref 22–32)
Calcium: 9.2 mg/dL (ref 8.9–10.3)
Calcium: 9.1 mg/dL (ref 8.9–10.3)
Chloride: 113 mmol/L — ABNORMAL HIGH (ref 98–111)
Chloride: 107 mmol/L (ref 98–111)
Creatinine, Ser: 1.1 mg/dL — ABNORMAL HIGH (ref 0.44–1.00)
Creatinine, Ser: 1.16 mg/dL — ABNORMAL HIGH (ref 0.44–1.00)
GFR, Estimated: 51 mL/min — ABNORMAL LOW (ref >60)
GFR, Estimated: 48 mL/min — ABNORMAL LOW (ref >60)
Glucose, Bld: 126 mg/dL — ABNORMAL HIGH (ref 70–99)
Glucose, Bld: 107 mg/dL — ABNORMAL HIGH (ref 70–99)
Potassium: 3.8 mmol/L (ref 3.5–5.1)
Potassium: 3.3 mmol/L — ABNORMAL LOW (ref 3.5–5.1)
Sodium: 142 mmol/L (ref 135–145)
Sodium: 141 mmol/L (ref 135–145)

## 2021-07-11 LAB — URINE CULTURE: Culture: NO GROWTH

## 2021-07-11 LAB — CBC
HCT: 23.7 % — ABNORMAL LOW (ref 36.0–46.0)
Hemoglobin: 7.8 g/dL — ABNORMAL LOW (ref 12.0–15.0)
MCH: 27.3 pg (ref 26.0–34.0)
MCHC: 32.9 g/dL (ref 30.0–36.0)
MCV: 82.9 fL (ref 80.0–100.0)
Platelets: 98 10*3/uL — ABNORMAL LOW (ref 150–400)
RBC: 2.86 MIL/uL — ABNORMAL LOW (ref 3.87–5.11)
RDW: 13.8 % (ref 11.5–15.5)
WBC: 6.3 10*3/uL (ref 4.0–10.5)
nRBC: 0 % (ref 0.0–0.2)

## 2021-07-11 MED ORDER — POTASSIUM CHLORIDE CRYS ER 20 MEQ PO TBCR
40.0000 meq | EXTENDED_RELEASE_TABLET | Freq: Once | ORAL | Status: AC
  Administered 2021-07-11: 40 meq via ORAL
  Filled 2021-07-11: qty 2

## 2021-07-11 MED ORDER — FUROSEMIDE 10 MG/ML IJ SOLN
60.0000 mg | Freq: Two times a day (BID) | INTRAMUSCULAR | Status: AC
  Administered 2021-07-11 (×2): 60 mg via INTRAVENOUS
  Filled 2021-07-11 (×2): qty 6

## 2021-07-11 MED ORDER — ENOXAPARIN SODIUM 40 MG/0.4ML IJ SOSY
40.0000 mg | PREFILLED_SYRINGE | INTRAMUSCULAR | Status: DC
  Administered 2021-07-12 – 2021-07-13 (×2): 40 mg via SUBCUTANEOUS
  Filled 2021-07-11 (×2): qty 0.4

## 2021-07-11 NOTE — Progress Notes (Addendum)
PROGRESS NOTE    Maryetta Csaszar  [id number] DOB: March 01, 1940  DOA: 07/09/2021 Date of Service: 07/11/21 PCP: [facility]     Brief Narrative / Hospital Course:  Jamylah Nicklas is a 81 y.o. female with medical history significant for Systolic heart failure, last EF 30 to 35% December 2021, CKD 3A, HTN, who presented by EMS from home 07/09/2021 for evaluation of a several day history of generalized weakness, with urinary and fecal incontinence but without diarrhea.  EMS reported temp of 103.  Patient denies cough or shortness of breath, nausea, vomiting or abdominal pain.  Denies headache 06/11:  ED VS 103.28F, HR 106, RR 24, BP 101/58, O2 100 RA. WBC 8, lactic 1.2, Hgb 8.4, Ct 1.84 (bl 1.16), K 3.2, UA kg Hgb no WBC, CXR nonacute, CT C/A/P nonacute no findings to explain SIRS/sepsis. Started on IV fluids, cefepime, vancomycin, metronidazole.  06/12: VS improved, sepsis criteria resolved, Cr improving 1.3. Stool cultures mentioned in H&P but couldn't see orders placed so will place orders and I alerted RN. Infectious disease following for strep bacteremia likely d/t exposure to recently sick granddaughter. Will need 7 days IV abx, daughter feels comfortable to administer this at home and would like to avoid SNF if at all possible. D/c IV fluids and started diuresis given hx CHF, ordered Echo given significant cardiac enlargement on CXR and recent aggressive IV fluids resuscitation. Dental pain, mech soft diet ordered  06/13: Echocardiogram resulted from 06/12: LVEF 25-30 severely decreased function global hypokinesis (this is worsened from last echo on file 12/2019 w/ EF 30-35 and moderately decreased function, LV w/ regional wall motion abnormalities), mod/sev dilated LV, grade 1 diastolic dysfunction. RV systolic fxn low normal, RV mildly enlarged. Pt appears compensated, will increase diuresis for few doses and monitor BMP, did not feel cardiology consult was needed at this time can follow  outpatient unelss worsens.   Consultants:  Infectious disease  Procedures: none    Subjective: Pt reports feeling well today, still bothered by L facial edema around eyelids which appears a bit worse from yesterday, no other complaints, eating breakfast. She reports she did sleep on her L side     ASSESSMENT & PLAN:   Principal Problem:   Sepsis ([place]) Active Problems:   Acute renal failure superimposed on stage 3a chronic kidney disease ([place])   Anemia   Chronic systolic CHF (congestive heart failure) ([place])   Protein-calorie malnutrition, severe   Hypertension   Generalized weakness   Chronic systolic CHF (congestive heart failure) ([place]) Cardiomegaly  Echo 06/12: to monitor for overload given IV fluid resuscitation - LVEF 25-30 severely decreased function global hypokinesis, mod/sev dilated LV, grade 1 diastolic dysfunction. RV systolic fxn low normal, RV mildly enlarges. Appears grossly compensated despite echo results.  Continued Lasix and carvedilol --> 06/13 w/ echo report back and improvement in renal function, increased to Lasix 60 mg IV x today, repeat BMP in PM and if renal function in acceptable parameters can have another 40-60 mg IV lasix (RN aware to hold PM lasix until BMP has resulted may need to s/o to night coverage), monitor I&O, can hopefully resume home diuretic regimen tomorrow     Sepsis ([place]) Sepsis resolved as of 07/10/21  Strep bacteremia: Blood cx (+) group A strep, likely d/t exposure from granddaughter who was dx w/ strep throat past few weeks Stopped sepsis fluids as sepsis criteria have resolved cefepime Vanco and Flagyl 06/11 --> changed to Ceftriaxone 06/12 by ID Follow procalcitonin stool  studies negative repeat blood cultures pending ID following   Acute renal failure superimposed on stage 3a chronic kidney disease ([facility]) Improved w/ IV fluids Stopped IV hydration  monitor renal function closely on diuresis  Anemia Hemoglobin 8.4.   Most recent was 12 about a year prior Serial H&H Anemia panel  Hypertension Continue carvedilol  Generalized weakness Possibly multifactorial and related to generalized frailty, acute illness and symptomatic anemia Treat as outlined above outpatient PT/OT  Edema of face NO ANGIOEDEMA Likely due to aggressive IV fluids, positional accumulation of fluid d/t lying onher side Encourage sitting up and walking diuresis    DVT prophylaxis: Lovenox Code Status: FULL Family Communication: no one at bedside on rounds, will try to call daughter later today.   Disposition Plan / TOC needs: PT eval pending, may need HH/SNF daughter would much prefer home and is able to administer daily IV antibiotics w/ education  Barriers to discharge / significant pending items: continued IV treatments and medical workup see A/P             Objective: Vitals:   07/10/21 2020 07/10/21 2022 07/11/21 0058 07/11/21 0427  BP: 99/71 105/70 107/75 110/88  Pulse: 74 75 70 69  Resp: 18  20 18   Temp: 98.4 F (36.9 C)  98 F (36.7 C) 98.5 F (36.9 C)  TempSrc: Oral  Oral Oral  SpO2: 99%  100% 100%  Weight:      Height:        Intake/Output Summary (Last 24 hours) at 07/11/2021 0830 Last data filed at 07/10/2021 1312 Gross per 24 hour  Intake 962.78 ml  Output --  Net 962.78 ml   Filed Weights   07/09/21 2021  Weight: 58.4 kg    Examination:  Constitutional:  VS as above General Appearance: alert, well-developed, well-nourished, NAD Eyes: Normal conjunctive, non-icteric sclera Edema L eyelid upper and lower Ears, Nose, Mouth, Throat: Poor dentiion No abscess / gingivitis noted  Neck: No masses, trachea midline No thyroid enlargement/tenderness/mass appreciated Respiratory: Fair respiratory effort, no increased WOB Very faint raled bilaterally Cardiovascular: S1/S2 normal, heart sounds distant  No lower extremity edema Gastrointestinal: Nontender, no masses Musculoskeletal:   No clubbing/cyanosis of digits Neurological: No cranial nerve deficit on limited exam Motor and sensation intact and symmetric Psychiatric: Normal judgment/insight Normal mood and affect      Scheduled Medications:   aspirin EC  81 mg Oral Daily   carvedilol  25 mg Oral BID WC   enoxaparin (LOVENOX) injection  30 mg Subcutaneous Q24H   furosemide  60 mg Intravenous BID   potassium chloride  10 mEq Oral Daily   pravastatin  40 mg Oral Daily    Continuous Infusions:  cefTRIAXone (ROCEPHIN)  IV 2 g (07/10/21 2123)    PRN Medications:  acetaminophen **OR** acetaminophen, ondansetron **OR** ondansetron (ZOFRAN) IV  Antimicrobials:  Anti-infectives (From admission, onward)    Start     Dose/Rate Route Frequency Ordered Stop   07/11/21 2100  vancomycin (VANCOREADY) IVPB 750 mg/150 mL  Status:  Discontinued        750 mg 150 mL/hr over 60 Minutes Intravenous Every 48 hours 07/10/21 0350 07/10/21 1439   07/10/21 2100  ceFEPIme (MAXIPIME) 2 g in sodium chloride 0.9 % 100 mL IVPB  Status:  Discontinued        2 g 200 mL/hr over 30 Minutes Intravenous Every 24 hours 07/10/21 0347 07/10/21 1506   07/10/21 2100  cefTRIAXone (ROCEPHIN) 2 g in sodium chloride 0.9 %  100 mL IVPB        2 g 200 mL/hr over 30 Minutes Intravenous Every 24 hours 07/10/21 1506     07/10/21 0900  metroNIDAZOLE (FLAGYL) IVPB 500 mg  Status:  Discontinued        500 mg 100 mL/hr over 60 Minutes Intravenous Every 12 hours 07/10/21 0306 07/10/21 1439   07/09/21 2030  ceFEPIme (MAXIPIME) 2 g in sodium chloride 0.9 % 100 mL IVPB        2 g 200 mL/hr over 30 Minutes Intravenous  Once 07/09/21 2017 07/09/21 2159   07/09/21 2030  metroNIDAZOLE (FLAGYL) IVPB 500 mg        500 mg 100 mL/hr over 60 Minutes Intravenous  Once 07/09/21 2017 07/09/21 2200   07/09/21 2030  vancomycin (VANCOCIN) IVPB 1000 mg/200 mL premix        1,000 mg 200 mL/hr over 60 Minutes Intravenous  Once 07/09/21 2017 07/09/21 2207        Data Reviewed: I have personally reviewed following labs and imaging studies  CBC: Recent Labs  Lab 07/09/21 2017 07/10/21 0630 07/11/21 0559  WBC 8.0 7.8 6.3  NEUTROABS 6.7  --   --   HGB 8.4* 8.1* 7.8*  HCT 26.3* 24.0* 23.7*  MCV 85.7 83.0 82.9  PLT 100* 92* 98*   Basic Metabolic Panel: Recent Labs  Lab 07/09/21 2017 07/10/21 0630 07/11/21 0559  NA 141  --  142  K 3.2*  --  3.8  CL 110  --  113*  CO2 21*  --  24  GLUCOSE 158*  --  126*  BUN 34*  --  31*  CREATININE 1.74* 1.23* 1.10*  CALCIUM 9.0  --  9.2   GFR: Estimated Creatinine Clearance: 33.2 mL/min (A) (by C-G formula based on SCr of 1.1 mg/dL (H)). Liver Function Tests: Recent Labs  Lab 07/09/21 2017  AST 38  ALT 26  ALKPHOS 75  BILITOT 1.3*  PROT 7.6  ALBUMIN 3.3*   No results for input(s): "LIPASE", "AMYLASE" in the last 168 hours. No results for input(s): "AMMONIA" in the last 168 hours. Coagulation Profile: Recent Labs  Lab 07/09/21 2017  INR 1.2   Cardiac Enzymes: No results for input(s): "CKTOTAL", "CKMB", "CKMBINDEX", "TROPONINI" in the last 168 hours. BNP (last 3 results) No results for input(s): "PROBNP" in the last 8760 hours. HbA1C: No results for input(s): "HGBA1C" in the last 72 hours. CBG: No results for input(s): "GLUCAP" in the last 168 hours. Lipid Profile: No results for input(s): "CHOL", "HDL", "LDLCALC", "TRIG", "CHOLHDL", "LDLDIRECT" in the last 72 hours. Thyroid Function Tests: No results for input(s): "TSH", "T4TOTAL", "FREET4", "T3FREE", "THYROIDAB" in the last 72 hours. Anemia Panel: No results for input(s): "VITAMINB12", "FOLATE", "FERRITIN", "TIBC", "IRON", "RETICCTPCT" in the last 72 hours. Urine analysis:    Component Value Date/Time   COLORURINE AMBER (A) 07/09/2021 2017   APPEARANCEUR CLOUDY (A) 07/09/2021 2017   LABSPEC 1.019 07/09/2021 2017   PHURINE 5.0 07/09/2021 2017   GLUCOSEU NEGATIVE 07/09/2021 2017   HGBUR LARGE (A) 07/09/2021 2017    BILIRUBINUR NEGATIVE 07/09/2021 2017   [place] NEGATIVE 07/09/2021 2017   PROTEINUR 100 (A) 07/09/2021 2017   NITRITE NEGATIVE 07/09/2021 2017   LEUKOCYTESUR NEGATIVE 07/09/2021 2017   Sepsis Labs: @LABRCNTIP (procalcitonin:4,lacticidven:4)  Recent Results (from the past 240 hour(s))  Resp Panel by RT-PCR (Flu A&B, Covid) Anterior Nasal Swab     Status: None   Collection Time: 07/09/21  8:17 PM   Specimen:  Anterior Nasal Swab  Result Value Ref Range Status   SARS Coronavirus 2 by RT PCR NEGATIVE NEGATIVE Final    Comment: (NOTE) SARS-CoV-2 target nucleic acids are NOT DETECTED.  The SARS-CoV-2 RNA is generally detectable in upper respiratory specimens during the acute phase of infection. The lowest concentration of SARS-CoV-2 viral copies this assay can detect is 138 copies/mL. A negative result does not preclude SARS-Cov-2 infection and should not be used as the sole basis for treatment or other patient management decisions. A negative result may occur with  improper specimen collection/handling, submission of specimen other than nasopharyngeal swab, presence of viral mutation(s) within the areas targeted by this assay, and inadequate number of viral copies(<138 copies/mL). A negative result must be combined with clinical observations, patient history, and epidemiological information. The expected result is Negative.  Fact Sheet for Patients:  EntrepreneurPulse.com.au  Fact Sheet for Healthcare Providers:  IncredibleEmployment.be  This test is no t yet approved or cleared by the [place] FDA and  has been authorized for detection and/or diagnosis of SARS-CoV-2 by FDA under an Emergency Use Authorization (EUA). This EUA will remain  in effect (meaning this test can be used) for the duration of the COVID-19 declaration under Section 564(b)(1) of the Act, 21 U.S.C.section 360bbb-3(b)(1), unless the authorization is terminated  or  revoked sooner.       Influenza A by PCR NEGATIVE NEGATIVE Final   Influenza B by PCR NEGATIVE NEGATIVE Final    Comment: (NOTE) The Xpert Xpress SARS-CoV-2/FLU/RSV plus assay is intended as an aid in the diagnosis of influenza from Nasopharyngeal swab specimens and should not be used as a sole basis for treatment. Nasal washings and aspirates are unacceptable for Xpert Xpress SARS-CoV-2/FLU/RSV testing.  Fact Sheet for Patients: EntrepreneurPulse.com.au  Fact Sheet for Healthcare Providers: IncredibleEmployment.be  This test is not yet approved or cleared by the [place] FDA and has been authorized for detection and/or diagnosis of SARS-CoV-2 by FDA under an Emergency Use Authorization (EUA). This EUA will remain in effect (meaning this test can be used) for the duration of the COVID-19 declaration under Section 564(b)(1) of the Act, 21 U.S.C. section 360bbb-3(b)(1), unless the authorization is terminated or revoked.  Performed at [facility], [address], [place] [address]   Blood Culture (routine x 2)     Status: None (Preliminary result)   Collection Time: 07/09/21  8:17 PM   Specimen: BLOOD  Result Value Ref Range Status   Specimen Description BLOOD RFOA  Final   Special Requests BOTTLES DRAWN AEROBIC AND ANAEROBIC BCAV  Final   Culture  Setup Time   Final    AEROBIC BOTTLE ONLY GRAM POSITIVE COCCI Organism ID to follow CRITICAL RESULT CALLED TO, READ BACK BY AND VERIFIED WITH: MORGAN HICKS ON 07/10/21@ 1257 RP  Performed at [facility], [address] [place], [place] [address]    Culture GRAM POSITIVE COCCI  Final   Report Status PENDING  Incomplete  Blood Culture ID Panel (Reflexed)     Status: Abnormal   Collection Time: 07/09/21  8:17 PM  Result Value Ref Range Status   Enterococcus faecalis NOT DETECTED NOT DETECTED Final   Enterococcus Faecium NOT DETECTED NOT DETECTED Final    Listeria monocytogenes NOT DETECTED NOT DETECTED Final   Staphylococcus species NOT DETECTED NOT DETECTED Final   Staphylococcus aureus (BCID) NOT DETECTED NOT DETECTED Final   Staphylococcus epidermidis NOT DETECTED NOT DETECTED Final   Staphylococcus lugdunensis NOT DETECTED  NOT DETECTED Final   Streptococcus species DETECTED (A) NOT DETECTED Final    Comment: CRITICAL RESULT CALLED TO, READ BACK BY AND VERIFIED WITH: MORGAN HICKS ON 07/10/21 @1257  RP    Streptococcus agalactiae NOT DETECTED NOT DETECTED Final   Streptococcus pneumoniae NOT DETECTED NOT DETECTED Final   Streptococcus pyogenes DETECTED (A) NOT DETECTED Final    Comment: CRITICAL RESULT CALLED TO, READ BACK BY AND VERIFIED WITH: MORGAN HICKS ON 07/10/21 @1257  RP    A.calcoaceticus-baumannii NOT DETECTED NOT DETECTED Final   Bacteroides fragilis NOT DETECTED NOT DETECTED Final   Enterobacterales NOT DETECTED NOT DETECTED Final   Enterobacter cloacae complex NOT DETECTED NOT DETECTED Final   Escherichia coli NOT DETECTED NOT DETECTED Final   Klebsiella aerogenes NOT DETECTED NOT DETECTED Final   Klebsiella oxytoca NOT DETECTED NOT DETECTED Final   Klebsiella pneumoniae NOT DETECTED NOT DETECTED Final   Proteus species NOT DETECTED NOT DETECTED Final   Salmonella species NOT DETECTED NOT DETECTED Final   Serratia marcescens NOT DETECTED NOT DETECTED Final   Haemophilus influenzae NOT DETECTED NOT DETECTED Final   Neisseria meningitidis NOT DETECTED NOT DETECTED Final   Pseudomonas aeruginosa NOT DETECTED NOT DETECTED Final   Stenotrophomonas maltophilia NOT DETECTED NOT DETECTED Final   Candida albicans NOT DETECTED NOT DETECTED Final   Candida auris NOT DETECTED NOT DETECTED Final   Candida glabrata NOT DETECTED NOT DETECTED Final   Candida krusei NOT DETECTED NOT DETECTED Final   Candida parapsilosis NOT DETECTED NOT DETECTED Final   Candida tropicalis NOT DETECTED NOT DETECTED Final   Cryptococcus neoformans/gattii  NOT DETECTED NOT DETECTED Final    Comment: Performed at [facility], [address] [place], [place] [address]  Blood Culture (routine x 2)     Status: None (Preliminary result)   Collection Time: 07/09/21  8:22 PM   Specimen: BLOOD  Result Value Ref Range Status   Specimen Description BLOOD LFOA  Final   Special Requests BOTTLES DRAWN AEROBIC AND ANAEROBIC BCAV  Final   Culture   Final    NO GROWTH 2 DAYS Performed at [facility], [address] [place], [place] [address]    Report Status PENDING  Incomplete  Gastrointestinal Panel by PCR , Stool     Status: None   Collection Time: 07/10/21  5:37 PM   Specimen: STOOL  Result Value Ref Range Status   Campylobacter species NOT DETECTED NOT DETECTED Final   Plesimonas shigelloides NOT DETECTED NOT DETECTED Final   Salmonella species NOT DETECTED NOT DETECTED Final   Yersinia enterocolitica NOT DETECTED NOT DETECTED Final   Vibrio species NOT DETECTED NOT DETECTED Final   Vibrio cholerae NOT DETECTED NOT DETECTED Final   Enteroaggregative E coli (EAEC) NOT DETECTED NOT DETECTED Final   Enteropathogenic E coli (EPEC) NOT DETECTED NOT DETECTED Final   Enterotoxigenic E coli (ETEC) NOT DETECTED NOT DETECTED Final   Shiga like toxin producing E coli (STEC) NOT DETECTED NOT DETECTED Final   Shigella/Enteroinvasive E coli (EIEC) NOT DETECTED NOT DETECTED Final   Cryptosporidium NOT DETECTED NOT DETECTED Final   Cyclospora cayetanensis NOT DETECTED NOT DETECTED Final   Entamoeba histolytica NOT DETECTED NOT DETECTED Final   Giardia lamblia NOT DETECTED NOT DETECTED Final   Adenovirus F40/41 NOT DETECTED NOT DETECTED Final   Astrovirus NOT DETECTED NOT DETECTED Final   Norovirus GI/GII NOT DETECTED NOT DETECTED Final   Rotavirus A NOT DETECTED NOT DETECTED Final   Sapovirus (I, II, IV, and  V) NOT DETECTED NOT DETECTED Final    Comment: Performed at [facility], [place], [place] [address]   C Difficile Quick Screen w PCR reflex     Status: None   Collection Time: 07/10/21  5:37 PM   Specimen: STOOL  Result Value Ref Range Status   C Diff antigen NEGATIVE NEGATIVE Final   C Diff toxin NEGATIVE NEGATIVE Final   C Diff interpretation No C. difficile detected.  Final    Comment: Performed at [facility], [address] [place], [place] [address]         Radiology Studies last 96 hours: ECHOCARDIOGRAM COMPLETE  Result Date: 07/10/2021    ECHOCARDIOGRAM REPORT   Patient Name:   Ashaki Duren Date of Exam: 07/10/2021 Medical Rec #:  [id number]  Height:       60.0 in Accession #:    [id number] Weight:       128.7 lb Date of Birth:  16-Aug-1940  BSA:          1.548 m Patient Age:    28 years   BP:           100/71 mmHg Patient Gender: F          HR:           76 bpm. Exam Location:  [facility] Procedure: 2D Echo, Color Doppler and Cardiac Doppler Indications:     I50.9 Congestive heart failure  History:         Patient has prior history of Echocardiogram examinations. CHF;                  Risk Factors:Hypertension and Former Smoker.  Sonographer:     Rosalia Hammers Referring Phys:  [id number] Emeterio Reeve Diagnosing Phys: Yolonda Kida MD IMPRESSIONS  1. Left ventricular ejection fraction, by estimation, is 25 to 30%. The left ventricle has severely decreased function. The left ventricle demonstrates global hypokinesis. The left ventricular internal cavity size was moderately to severely dilated. There is mild left ventricular hypertrophy. Left ventricular diastolic parameters are consistent with Grade I diastolic dysfunction (impaired relaxation).  2. Right ventricular systolic function is low normal. The right ventricular size is mildly enlarged.  3. Left atrial size was mild to moderately dilated.  4. Right atrial size was mild to moderately dilated.  5. The mitral valve is grossly normal. 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  6. Tricuspid valve regurgitation is moderate to  severe.  7. The aortic valve is grossly normal. Aortic valve regurgitation is mild. FINDINGS  Left Ventricle: Left ventricular ejection fraction, by estimation, is 25 to 30%. The left ventricle has severely decreased function. The left ventricle demonstrates global hypokinesis. The left ventricular internal cavity size was moderately to severely  dilated. There is mild left ventricular hypertrophy. Abnormal (paradoxical) septal motion, consistent with left bundle branch block. Left ventricular diastolic parameters are consistent with Grade I diastolic dysfunction (impaired relaxation). Right Ventricle: The right ventricular size is mildly enlarged. No increase in right ventricular wall thickness. Right ventricular systolic function is low normal. Left Atrium: Left atrial size was mild to moderately dilated. Right Atrium: Right atrial size was mild to moderately dilated. Pericardium: There is no evidence of pericardial effusion. Mitral Valve: The mitral valve is grossly normal. 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 MV peak gradient, 5.2 mmHg. The mean mitral valve gradient is 3.0 mmHg. Tricuspid Valve: The tricuspid valve is grossly normal. 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  Aortic Valve: The aortic valve is grossly normal. Aortic valve regurgitation is mild. Aortic valve mean gradient measures 12.0 mmHg. Aortic valve peak gradient measures 23.0 mmHg. Aortic valve area, by VTI measures 1.33 cm. Pulmonic Valve: The pulmonic valve was normal in structure. Pulmonic valve regurgitation is trivial. Aorta: The ascending aorta was not well visualized. IAS/Shunts: No atrial level shunt detected by color flow Doppler.  LEFT VENTRICLE PLAX 2D LVIDd:         5.67 cm      Diastology LVIDs:         4.88 cm      LV e' medial:    4.38 cm/s LV PW:         1.51 cm      LV E/e' medial:  25.6 LV IVS:        1.44 cm      LV e' lateral:   11.60 cm/s LVOT diam:     1.70 cm      LV E/e' lateral: 9.7 LV SV:          48 LV SV Index:   31 LVOT Area:     2.27 cm  LV Volumes (MOD) LV vol d, MOD A2C: 202.0 ml LV vol d, MOD A4C: 161.0 ml LV vol s, MOD A2C: 132.0 ml LV vol s, MOD A4C: 113.0 ml LV SV MOD A2C:     70.0 ml LV SV MOD A4C:     161.0 ml LV SV MOD BP:      54.3 ml RIGHT VENTRICLE RV Basal diam:  4.46 cm RV S prime:     11.70 cm/s LEFT ATRIUM              Index         RIGHT ATRIUM           Index LA diam:        5.40 cm  3.49 cm/m    RA Area:     28.40 cm LA Vol (A2C):   180.0 ml 116.30 ml/m  RA Volume:   88.90 ml  57.44 ml/m LA Vol (A4C):   163.0 ml 105.32 ml/m LA Biplane Vol: 173.0 ml 111.78 ml/m  AORTIC VALVE                     PULMONIC VALVE AV Area (Vmax):    1.22 cm      PV Vmax:          0.84 m/s AV Area (Vmean):   1.31 cm      PV Vmean:         58.600 cm/s AV Area (VTI):     1.33 cm      PV VTI:           0.125 m AV Vmax:           240.00 cm/s   PV Peak grad:     2.8 mmHg AV Vmean:          158.000 cm/s  PV Mean grad:     2.0 mmHg AV VTI:            0.362 m       PR End Diast Vel: 6.05 msec AV Peak Grad:      23.0 mmHg AV Mean Grad:      12.0 mmHg LVOT Vmax:         129.00 cm/s LVOT Vmean:        91.400 cm/s LVOT  VTI:          0.212 m LVOT/AV VTI ratio: 0.59  AORTA Ao Root diam: 2.90 cm MITRAL VALVE                TRICUSPID VALVE MV Area (PHT): 3.24 cm     TR Peak grad:   23.0 mmHg MV Area VTI:   0.91 cm     TR Vmax:        240.00 cm/s MV Peak grad:  5.2 mmHg MV Mean grad:  3.0 mmHg     SHUNTS MV Vmax:       1.14 m/s     Systemic VTI:  0.21 m MV Vmean:      83.3 cm/s    Systemic Diam: 1.70 cm MV Decel Time: 234 msec MV E velocity: 112.00 cm/s MV A velocity: 123.00 cm/s MV E/A ratio:  0.91 Dwayne D Callwood MD Electronically signed by Yolonda Kida MD Signature Date/Time: 07/10/2021/5:36:48 PM    Final    CT ABDOMEN PELVIS WO CONTRAST  Result Date: 07/09/2021 CLINICAL DATA:  Diarrhea dairrhea, fever, sepsis of unknown origin EXAM: CT ABDOMEN AND PELVIS WITHOUT CONTRAST TECHNIQUE: Multidetector CT  imaging of the abdomen and pelvis was performed following the standard protocol without IV contrast. RADIATION DOSE REDUCTION: This exam was performed according to the departmental dose-optimization program which includes automated exposure control, adjustment of the mA and/or kV according to patient size and/or use of iterative reconstruction technique. COMPARISON:  None Available. FINDINGS: Lower chest: Trace bilateral pleural effusions. Marked cardiomegaly. Small pericardial effusion. Hypoattenuation of the cardiac blood pool is in keeping with at least moderate anemia. Hepatobiliary: Cholelithiasis without pericholecystic inflammatory change. Liver unremarkable. No definite intra or extrahepatic biliary ductal dilation on this noncontrast examination. Pancreas: Unremarkable Spleen: Unremarkable Adrenals/Urinary Tract: Adrenal glands are unremarkable. Kidneys are normal, without renal calculi, focal lesion, or hydronephrosis. Bladder is unremarkable. Stomach/Bowel: The stomach, small bowel, and large bowel are unremarkable on this noncontrast examination. The appendix is not clearly identified, however, no secondary signs of appendicitis are seen within the right lower quadrant of the abdomen. Trace ascites. No free intraperitoneal gas. Vascular/Lymphatic: Aortic atherosclerosis. No enlarged abdominal or pelvic lymph nodes. 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 Pelvic organs are otherwise unremarkable. Other: No abdominal wall hernia. Musculoskeletal: No acute bone abnormality. Degenerative changes are seen within the lumbar spine. IMPRESSION: No acute intra-abdominal pathology identified. No definite radiographic explanation for the patient's reported symptoms. Marked cardiomegaly. Moderate anemia. Small pericardial effusion, trace effusions, and trace ascites may reflect changes of cardiogenic failure and/or anasarca. Cholelithiasis Aortic Atherosclerosis (ICD10-I70.0).  Electronically Signed   By: Fidela Salisbury M.D.   On: 07/09/2021 22:18   DG Chest Port 1 View  Result Date: 07/09/2021 CLINICAL DATA:  Question sepsis EXAM: PORTABLE CHEST 1 VIEW COMPARISON:  07/29/2020 FINDINGS: Cardiac silhouette is markedly enlarged. No change from comparison exam. Central venous congestion is mild. No pleural fluid. No pneumothorax. IMPRESSION: Marked cardiomegaly not changed from comparison exam. No acute findings. Electronically Signed   By: Suzy Bouchard M.D.   On: 07/09/2021 20:59            LOS: 2 days    Time spent: 35 min    Emeterio Reeve, DO [facility] 07/11/2021, 8:30 AM   Staff may message me via secure chat in [place]  but this may not receive immediate response,  please page for urgent matters!  If 7PM-7AM, please contact night-coverage [email or web address]  Dictation software was used to generate the above note. Typos may occur and escape review, as with typed/written notes. Please contact Dr Sheppard Coil directly for clarity if needed.

## 2021-07-11 NOTE — Progress Notes (Signed)
PHARMACIST - PHYSICIAN COMMUNICATION  CONCERNING:  Enoxaparin (Lovenox) for DVT Prophylaxis   DESCRIPTION: Patient was prescribed enoxaprin 30mg  q24 hours for VTE prophylaxis.   Filed Weights   07/09/21 2021  Weight: 58.4 kg (128 lb 11.2 oz)    Body mass index is 25.13 kg/m.  Estimated Creatinine Clearance: 33.2 mL/min (A) (by C-G formula based on SCr of 1.1 mg/dL (H)).   Patient is candidate for enoxaparin 40mg  every 24 hours based on CrCl >67ml/min   RECOMMENDATION: Pharmacy has adjusted enoxaparin dose per [facility] policy.  Patient is now receiving enoxaparin 40 mg every 24 hours    Darnelle Bos, PharmD Clinical Pharmacist  07/11/2021 4:02 PM

## 2021-07-11 NOTE — Assessment & Plan Note (Signed)
NO ANGIOEDEMA Likely due to aggressive IV fluids, positional accumulation of fluid d/t lying onher side  Encourage sitting up and walking  diuresis

## 2021-07-11 NOTE — Progress Notes (Signed)
Date of Admission:  07/09/2021      ID: Zoe LatherRosa Boyle is a 81 y.o. female  Principal Problem:   Sepsis (HCC) Active Problems:   Chronic systolic CHF (congestive heart failure) (HCC)   Acute renal failure superimposed on stage 3a chronic kidney disease (HCC)   Protein-calorie malnutrition, severe   Anemia   Hypertension   Generalized weakness    Subjective: Pt sitting in chair and watching TV Does not c/o pain left side of thr face even though there is swelling unilaterally No fever N sore throat No difficulty swallowing   Medications:   aspirin EC  81 mg Oral Daily   carvedilol  25 mg Oral BID WC   enoxaparin (LOVENOX) injection  30 mg Subcutaneous Q24H   furosemide  60 mg Intravenous BID   potassium chloride  10 mEq Oral Daily   pravastatin  40 mg Oral Daily    Objective: Vital signs in last 24 hours: Temp:  [97.9 F (36.6 C)-98.7 F (37.1 C)] 97.9 F (36.6 C) (06/13 0844) Pulse Rate:  [62-80] 62 (06/13 0844) Resp:  [15-20] 16 (06/13 0844) BP: (91-112)/(67-88) 112/79 (06/13 0844) SpO2:  [99 %-100 %] 100 % (06/13 0844)  LDA Foley Central lines Other catheters  PHYSICAL EXAM:  General: Alert, cooperative, no distress, appears stated age.   Head: Normocephalic, without obvious abnormality, atraumatic. Face- left eye lids edema- no warmth, tenderness or erythema   Eyes: Conjunctivae clear, anicteric sclerae. Pupils are equal ENT Nares normal. No drainage or sinus tenderness. Lips, mucosa, and tongue normal. No Thrush Neck: Supple, symmetrical, no adenopathy, thyroid: non tender no carotid bruit and no JVD. Back: No CVA tenderness. Lungs: Clear to auscultation bilaterally. No Wheezing or Rhonchi. No rales. Heart: Regular rate and rhythm, no murmur, rub or gallop. Abdomen: Soft, non-tender,not distended. Bowel sounds normal. No masses Extremities: atraumatic, no cyanosis. No edema. No clubbing Skin: No rashes or lesions. Or bruising Lymph: Cervical,  supraclavicular normal. Neurologic: Grossly non-focal  Lab Results Recent Labs    07/09/21 2017 07/10/21 0630 07/11/21 0559  WBC 8.0 7.8 6.3  HGB 8.4* 8.1* 7.8*  HCT 26.3* 24.0* 23.7*  NA 141  --  142  K 3.2*  --  3.8  CL 110  --  113*  CO2 21*  --  24  BUN 34*  --  31*  CREATININE 1.74* 1.23* 1.10*   Liver Panel Recent Labs    07/09/21 2017  PROT 7.6  ALBUMIN 3.3*  AST 38  ALT 26  ALKPHOS 75  BILITOT 1.3*    Microbiology: 07/09/21 BC 2/2 Group A streptococcus Studies/Results: ECHOCARDIOGRAM COMPLETE  Result Date: 07/10/2021    ECHOCARDIOGRAM REPORT   Patient Name:   Zoe Boyle Date of Exam: 07/10/2021 Medical Rec #:  [id number]  Height:       60.0 in Accession #:    [id number] Weight:       128.7 lb Date of Birth:  10-28-1940  BSA:          1.548 m Patient Age:    81 years   BP:           100/71 mmHg Patient Gender: F          HR:           76 bpm. Exam Location:  [facility] Procedure: 2D Echo, Color Doppler and Cardiac Doppler Indications:     I50.9 Congestive heart failure  History:         Patient has  prior history of Echocardiogram examinations. CHF;                  Risk Factors:Hypertension and Former Smoker.  Sonographer:     Rosalia Hammers Referring Phys:  [id number] Emeterio Reeve Diagnosing Phys: Yolonda Kida MD IMPRESSIONS  1. Left ventricular ejection fraction, by estimation, is 25 to 30%. The left ventricle has severely decreased function. The left ventricle demonstrates global hypokinesis. The left ventricular internal cavity size was moderately to severely dilated. There is mild left ventricular hypertrophy. Left ventricular diastolic parameters are consistent with Grade I diastolic dysfunction (impaired relaxation).  2. Right ventricular systolic function is low normal. The right ventricular size is mildly enlarged.  3. Left atrial size was mild to moderately dilated.  4. Right atrial size was mild to moderately dilated.  5. The mitral valve is grossly normal. Moderate  to severe mitral valve regurgitation.  6. 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  7. The aortic valve is grossly normal. Aortic valve regurgitation is mild. FINDINGS  Left Ventricle: Left ventricular ejection fraction, by estimation, is 25 to 30%. The left ventricle has severely decreased function. The left ventricle demonstrates global hypokinesis. The left ventricular internal cavity size was moderately to severely  dilated. There is mild left ventricular hypertrophy. Abnormal (paradoxical) septal motion, consistent with left bundle branch block. Left ventricular diastolic parameters are consistent with Grade I diastolic dysfunction (impaired relaxation). Right Ventricle: The right ventricular size is mildly enlarged. No increase in right ventricular wall thickness. Right ventricular systolic function is low normal. Left Atrium: Left atrial size was mild to moderately dilated. Right Atrium: Right atrial size was mild to moderately dilated. Pericardium: There is no evidence of pericardial effusion. Mitral Valve: The mitral valve is grossly normal. 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 MV peak gradient, 5.2 mmHg. The mean mitral valve gradient is 3.0 mmHg. Tricuspid Valve: The tricuspid valve is grossly normal. 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 Aortic Valve: The aortic valve is grossly normal. Aortic valve regurgitation is mild. Aortic valve mean gradient measures 12.0 mmHg. Aortic valve peak gradient measures 23.0 mmHg. Aortic valve area, by VTI measures 1.33 cm. Pulmonic Valve: The pulmonic valve was normal in structure. Pulmonic valve regurgitation is trivial. Aorta: The ascending aorta was not well visualized. IAS/Shunts: No atrial level shunt detected by color flow Doppler.  LEFT VENTRICLE PLAX 2D LVIDd:         5.67 cm      Diastology LVIDs:         4.88 cm      LV e' medial:    4.38 cm/s LV PW:         1.51 cm      LV E/e' medial:  25.6 LV IVS:        1.44 cm      LV  e' lateral:   11.60 cm/s LVOT diam:     1.70 cm      LV E/e' lateral: 9.7 LV SV:         48 LV SV Index:   31 LVOT Area:     2.27 cm  LV Volumes (MOD) LV vol d, MOD A2C: 202.0 ml LV vol d, MOD A4C: 161.0 ml LV vol s, MOD A2C: 132.0 ml LV vol s, MOD A4C: 113.0 ml LV SV MOD A2C:     70.0 ml LV SV MOD A4C:     161.0 ml LV SV MOD BP:  54.3 ml RIGHT VENTRICLE RV Basal diam:  4.46 cm RV S prime:     11.70 cm/s LEFT ATRIUM              Index         RIGHT ATRIUM           Index LA diam:        5.40 cm  3.49 cm/m    RA Area:     28.40 cm LA Vol (A2C):   180.0 ml 116.30 ml/m  RA Volume:   88.90 ml  57.44 ml/m LA Vol (A4C):   163.0 ml 105.32 ml/m LA Biplane Vol: 173.0 ml 111.78 ml/m  AORTIC VALVE                     PULMONIC VALVE AV Area (Vmax):    1.22 cm      PV Vmax:          0.84 m/s AV Area (Vmean):   1.31 cm      PV Vmean:         58.600 cm/s AV Area (VTI):     1.33 cm      PV VTI:           0.125 m AV Vmax:           240.00 cm/s   PV Peak grad:     2.8 mmHg AV Vmean:          158.000 cm/s  PV Mean grad:     2.0 mmHg AV VTI:            0.362 m       PR End Diast Vel: 6.05 msec AV Peak Grad:      23.0 mmHg AV Mean Grad:      12.0 mmHg LVOT Vmax:         129.00 cm/s LVOT Vmean:        91.400 cm/s LVOT VTI:          0.212 m LVOT/AV VTI ratio: 0.59  AORTA Ao Root diam: 2.90 cm MITRAL VALVE                TRICUSPID VALVE MV Area (PHT): 3.24 cm     TR Peak grad:   23.0 mmHg MV Area VTI:   0.91 cm     TR Vmax:        240.00 cm/s MV Peak grad:  5.2 mmHg MV Mean grad:  3.0 mmHg     SHUNTS MV Vmax:       1.14 m/s     Systemic VTI:  0.21 m MV Vmean:      83.3 cm/s    Systemic Diam: 1.70 cm MV Decel Time: 234 msec MV E velocity: 112.00 cm/s MV A velocity: 123.00 cm/s MV E/A ratio:  0.91 Dwayne D Callwood MD Electronically signed by Yolonda Kida MD Signature Date/Time: 07/10/2021/5:36:48 PM    Final    CT ABDOMEN PELVIS WO CONTRAST  Result Date: 07/09/2021 CLINICAL DATA:  Diarrhea dairrhea, fever, sepsis of  unknown origin EXAM: CT ABDOMEN AND PELVIS WITHOUT CONTRAST TECHNIQUE: Multidetector CT imaging of the abdomen and pelvis was performed following the standard protocol without IV contrast. RADIATION DOSE REDUCTION: This exam was performed according to the departmental dose-optimization program which includes automated exposure control, adjustment of the mA and/or kV according to patient size and/or use of iterative reconstruction technique. COMPARISON:  None Available. FINDINGS: Lower chest: Trace bilateral  pleural effusions. Marked cardiomegaly. Small pericardial effusion. Hypoattenuation of the cardiac blood pool is in keeping with at least moderate anemia. Hepatobiliary: Cholelithiasis without pericholecystic inflammatory change. Liver unremarkable. No definite intra or extrahepatic biliary ductal dilation on this noncontrast examination. Pancreas: Unremarkable Spleen: Unremarkable Adrenals/Urinary Tract: Adrenal glands are unremarkable. Kidneys are normal, without renal calculi, focal lesion, or hydronephrosis. Bladder is unremarkable. Stomach/Bowel: The stomach, small bowel, and large bowel are unremarkable on this noncontrast examination. The appendix is not clearly identified, however, no secondary signs of appendicitis are seen within the right lower quadrant of the abdomen. Trace ascites. No free intraperitoneal gas. Vascular/Lymphatic: Aortic atherosclerosis. No enlarged abdominal or pelvic lymph nodes. 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 Pelvic organs are otherwise unremarkable. Other: No abdominal wall hernia. Musculoskeletal: No acute bone abnormality. Degenerative changes are seen within the lumbar spine. IMPRESSION: No acute intra-abdominal pathology identified. No definite radiographic explanation for the patient's reported symptoms. Marked cardiomegaly. Moderate anemia. Small pericardial effusion, trace effusions, and trace ascites may reflect changes of  cardiogenic failure and/or anasarca. Cholelithiasis Aortic Atherosclerosis (ICD10-I70.0). Electronically Signed   By: Fidela Salisbury M.D.   On: 07/09/2021 22:18   DG Chest Port 1 View  Result Date: 07/09/2021 CLINICAL DATA:  Question sepsis EXAM: PORTABLE CHEST 1 VIEW COMPARISON:  07/29/2020 FINDINGS: Cardiac silhouette is markedly enlarged. No change from comparison exam. Central venous congestion is mild. No pleural fluid. No pneumothorax. IMPRESSION: Marked cardiomegaly not changed from comparison exam. No acute findings. Electronically Signed   By: Suzy Bouchard M.D.   On: 07/09/2021 20:59     Assessment/Plan: Group A streptococcus bacteremia No skin lesions Left eyelids swollen- could be dependent edema But keep a close eye on worsening or development of periorbital cellulitis Pt is currently on IV ceftriaxone- will need IV minimum 07/14/21 followed by PO amoxicillin until 07/19/21 Pt is stable with no septic shock   CHF- cardiomegaly with EF 25-30% On furosemide and coreg  Anemia  AKI on CKD- stable  Discussed the management with patient

## 2021-07-11 NOTE — TOC Initial Note (Signed)
Transition of Care [facility]) - Initial/Assessment Note    Patient Details  Name: Zoe Boyle MRN: [id number] Date of Birth: [birth year]  [facility] CM/SW Contact:    Pete Pelt, RN Phone Number: 07/11/2021, 3:59 PM  Clinical Narrative:     Patient lives at home with daughter.  She states daughter provides transportation and can assist with meds.    Patient agrees to outpatient therapy, as daughter can transport.  Denies other needs at this time.              Expected Discharge Plan: OP Rehab Barriers to Discharge: Continued Medical Work up   Patient Goals and CMS Choice     Choice offered to / list presented to : NA  Expected Discharge Plan and Services Expected Discharge Plan: OP Rehab       Living arrangements for the past 2 months: Single Family Home                                      Prior Living Arrangements/Services Living arrangements for the past 2 months: Single Family Home Lives with:: Self, Adult Children Patient language and need for interpreter reviewed:: Yes Do you feel safe going back to the place where you live?: Yes      Need for Family Participation in Patient Care: Yes (Comment) Care giver support system in place?: Yes (comment)   Criminal Activity/Legal Involvement Pertinent to Current Situation/Hospitalization: No - Comment as needed  Activities of Daily Living Home Assistive Devices/Equipment: None ADL Screening (condition at time of admission) Patient's cognitive ability adequate to safely complete daily activities?: Yes Is the patient deaf or have difficulty hearing?: No Does the patient have difficulty seeing, even when wearing glasses/contacts?: No Does the patient have difficulty concentrating, remembering, or making decisions?: No Patient able to express need for assistance with ADLs?: Yes Does the patient have difficulty dressing or bathing?: No Independently performs ADLs?: Yes (appropriate for developmental  age) Does the patient have difficulty walking or climbing stairs?: Yes Weakness of Legs: Both Weakness of Arms/Hands: None  Permission Sought/Granted Permission sought to share information with : Case Manager Permission granted to share information with : Yes, Verbal Permission Granted              Emotional Assessment Appearance:: Appears stated age Attitude/Demeanor/Rapport: Gracious, Engaged Affect (typically observed): Pleasant, Appropriate Orientation: : Oriented to Self, Oriented to Place, Oriented to  Time Alcohol / Substance Use: Not Applicable Psych Involvement: No (comment)  Admission diagnosis:  Fever, unknown origin [R50.9] Sepsis ([place]) [A41.9] Sepsis, due to unspecified organism, unspecified whether acute organ dysfunction present [facility]) [A41.9] Patient Active Problem List   Diagnosis Date Noted   Edema of face 07/11/2021   Sepsis ([place]) 07/09/2021   Anemia 07/09/2021   Hypertension    Generalized weakness    Protein-calorie malnutrition, severe 01/01/2020   Pneumonia [id number]   Chronic systolic CHF (congestive heart failure) ([place]) 12/31/2019   Acute renal failure superimposed on stage 3a chronic kidney disease ([place]) 12/31/2019   Pain, dental 12/31/2019   PCP:  [place]:   [facility] [address] [place] [address] Phone: [phone] Fax: [phone]     Social Determinants of Health (SDOH) Interventions    Readmission Risk Interventions     No data to display

## 2021-07-11 NOTE — Evaluation (Signed)
Occupational Therapy Evaluation Patient Details Name: Zoe Boyle MRN: [id number] DOB: 1941-01-25 Today's Date: 07/11/2021   History of Present Illness Pt is a 81 yo female that presented to ED for weakness. Workup showed group A streptococcus bacteremia, AKI on CKD. PMH of HTN, CHF, CKD.   Clinical Impression   Zoe Boyle presents with generalized weakness and fatigue. She lives with her daughter and two granddaughters and has been Mod I in ADLs/fxl mobility. She reports no pain today and states that she feels a "bit tired" but is at or near her baseline level of mobility. She is able to perform grooming in standing, toileting, bed mobility, transfers, ambulation, all with Mod I, good balance, and no AD required. She is alert and oriented to time, place, situation. Pt, pt's daughter, and therapist all in agreement that pt does not require any additional OT services at this time. Family will be available to provide PRN assistance to pt as she regains her strength. Will sign off.    Recommendations for follow up therapy are one component of a multi-disciplinary discharge planning process, led by the attending physician.  Recommendations may be updated based on patient status, additional functional criteria and insurance authorization.   Follow Up Recommendations  No OT follow up    Assistance Recommended at Discharge PRN  Patient can return home with the following Assist for transportation;Assistance with cooking/housework    Functional Status Assessment  Patient has had a recent decline in their functional status and demonstrates the ability to make significant improvements in function in a reasonable and predictable amount of time.  Equipment Recommendations  None recommended by OT    Recommendations for Other Services       Precautions / Restrictions Precautions Precautions: Fall Restrictions Weight Bearing Restrictions: No      Mobility Bed Mobility Overal bed mobility: Modified  Independent                  Transfers Overall transfer level: Modified independent Equipment used: None                      Balance Overall balance assessment: Needs assistance Sitting-balance support: Feet supported Sitting balance-Leahy Scale: Good     Standing balance support: No upper extremity supported Standing balance-Leahy Scale: Good Standing balance comment: some degree of "furniture surfing" while ambulating in room                           ADL either performed or assessed with clinical judgement   ADL Overall ADL's : Modified independent;At baseline                                       General ADL Comments: Able to complete grooming in standing, toileting, UB bathing w/ Mod I (increased time)     Vision Patient Visual Report: No change from baseline Additional Comments: left eye swollen     Perception     Praxis      Pertinent Vitals/Pain Pain Assessment Pain Assessment: No/denies pain     Hand Dominance     Extremity/Trunk Assessment Upper Extremity Assessment Upper Extremity Assessment: Overall WFL for tasks assessed   Lower Extremity Assessment Lower Extremity Assessment: Generalized weakness   Cervical / Trunk Assessment Cervical / Trunk Assessment: Normal   Communication Communication Communication: No difficulties   Cognition Arousal/Alertness:  Awake/alert Behavior During Therapy: WFL for tasks assessed/performed Overall Cognitive Status: Within Functional Limits for tasks assessed                                       General Comments  swelling on L side of face, especially concentrated in eye area    Exercises Other Exercises Other Exercises: Educ re: home safety, PoC, DC recs   Shoulder Instructions      Home Living Family/patient expects to be discharged to:: Private residence Living Arrangements: Children Available Help at Discharge: Family;Available 24  hours/day Type of Home: Apartment Home Access: Stairs to enter Entrance Stairs-Number of Steps: flight   Home Layout: One level     Bathroom Shower/Tub: Teacher, early years/pre: Standard     Home Equipment: None          Prior Functioning/Environment Prior Level of Function : Independent/Modified Independent                        OT Problem List: Decreased strength;Decreased activity tolerance      OT Treatment/Interventions:      OT Goals(Current goals can be found in the care plan section) Acute Rehab OT Goals Patient Stated Goal: to go home soon OT Goal Formulation: With patient Time For Goal Achievement: 07/25/21 Potential to Achieve Goals: Good  OT Frequency:      Co-evaluation              AM-PAC OT "6 Clicks" Daily Activity     Outcome Measure Help from another person eating meals?: None Help from another person taking care of personal grooming?: None Help from another person toileting, which includes using toliet, bedpan, or urinal?: None Help from another person bathing (including washing, rinsing, drying)?: A Little Help from another person to put on and taking off regular upper body clothing?: None Help from another person to put on and taking off regular lower body clothing?: None 6 Click Score: 23   End of Session    Activity Tolerance: Patient tolerated treatment well Patient left: in chair;with family/visitor present  OT Visit Diagnosis: Muscle weakness (generalized) (M62.81)                Time: 1115-1130 OT Time Calculation (min): 15 min Charges:  OT General Charges $OT Visit: 1 Visit OT Evaluation $OT Eval Low Complexity: 1 Low OT Treatments $Self Care/Home Management : 8-22 mins Josiah Lobo, PhD, MS, OTR/L 07/11/21, 12:32 PM

## 2021-07-11 NOTE — Therapy (Addendum)
Occupational Therapy * Physical Therapy * Speech Therapy          DATE _____13 June 2023______________ PATIENT NAME______[name]_______________ PATIENT MRN_________MRN:  [id number]___________  DIAGNOSIS/DIAGNOSIS CODE _______sepsis A41.9_______________ DATE OF DISCHARGE: ______________  PRIMARY CARE PHYSICIAN __________[facility]________________ PCP PHONE/FAX___________________________     Dear Provider (Name: __________________   Fax: ___________________________):   I certify that I have examined this patient and that occupational/physical/speech therapy is necessary on an outpatient basis.    The patient has expressed interest in completing their recommended course of therapy at your location.  Once a formal order from the patient's primary care physician has been obtained, please contact him/her to schedule an appointment for evaluation at your earliest convenience.   [  x]  Physical Therapy Evaluate and Treat          [  ]  Occupational Therapy Evaluate and Treat                                    [  ]  Speech Therapy Evaluate and Treat       The patient's primary care physician (listed above) must furnish and be responsible for a formal order such that the recommended services may be furnished while under the primary physician's care, and that the plan of care will be established and reviewed every 30 days (or more often if condition necessitates).

## 2021-07-11 NOTE — Evaluation (Signed)
Physical Therapy Evaluation Patient Details Name: Zoe Boyle MRN: [id number] DOB: [birth year] Today's Date: 07/11/2021  History of Present Illness  Pt is a 81 yo female that presented to ED for weakness. Workup showed group A streptococcus bacteremia, AKI on CKD. PMH of HTN, CHF, CKD.   Clinical Impression  Patient alert, agreeable to PT, denied pain. The patient reported at baseline she is independent with mobility, performs IADLs, denies falls. Lives with daughter and granddaughter who will be available at discharge to assist as needed.  The patient was able to perform bed mobility and transfers modI. Good sitting and standing balance noted. She was able to ambulate ~38ft in room (declined out of room mobility at this time), supervision without AD. She did display some mild gait path deviations but no LOB noted. Pt up in chair with all needs in reach and set up for breakfast. Overall the patient demonstrated near return to baseline level of functioning but would benefit from outpatient PT to maximize balance and activity tolerance/endurance.        Recommendations for follow up therapy are one component of a multi-disciplinary discharge planning process, led by the attending physician.  Recommendations may be updated based on patient status, additional functional criteria and insurance authorization.  Follow Up Recommendations Outpatient PT    Assistance Recommended at Discharge Intermittent Supervision/Assistance  Patient can return home with the following  Assistance with cooking/housework;Help with stairs or ramp for entrance;Assist for transportation    Equipment Recommendations None recommended by PT  Recommendations for Other Services       Functional Status Assessment Patient has had a recent decline in their functional status and demonstrates the ability to make significant improvements in function in a reasonable and predictable amount of time.     Precautions / Restrictions  Precautions Precautions: Fall Restrictions Weight Bearing Restrictions: No      Mobility  Bed Mobility Overal bed mobility: Modified Independent                  Transfers Overall transfer level: Modified independent Equipment used: None                    Ambulation/Gait Ambulation/Gait assistance: Supervision Gait Distance (Feet): 40 Feet Assistive device: None   Gait velocity: decreased     General Gait Details: mild gait path deviations but no LOB noted  Stairs            Wheelchair Mobility    Modified Rankin (Stroke Patients Only)       Balance Overall balance assessment: Needs assistance Sitting-balance support: Feet supported Sitting balance-Leahy Scale: Good       Standing balance-Leahy Scale: Good                               Pertinent Vitals/Pain Pain Assessment Pain Assessment: No/denies pain    Home Living Family/patient expects to be discharged to:: Private residence Living Arrangements: Children Available Help at Discharge: Family;Available 24 hours/day (daughter and granddaughter should be around all day due to school being out) Type of Home: Apartment Home Access: Stairs to enter   [employer] of Steps: flight   Home Layout: One level Home Equipment: None      Prior Function Prior Level of Function : Independent/Modified Independent                     Hand Dominance  Extremity/Trunk Assessment   Upper Extremity Assessment Upper Extremity Assessment: Overall WFL for tasks assessed    Lower Extremity Assessment Lower Extremity Assessment: Generalized weakness    Cervical / Trunk Assessment Cervical / Trunk Assessment: Normal  Communication   Communication: No difficulties  Cognition Arousal/Alertness: Awake/alert Behavior During Therapy: WFL for tasks assessed/performed Overall Cognitive Status: Within Functional Limits for tasks assessed                                           General Comments      Exercises     Assessment/Plan    PT Assessment Patient needs continued PT services  PT Problem List Decreased mobility;Decreased activity tolerance;Decreased balance       PT Treatment Interventions DME instruction;Therapeutic exercise;Gait training;Balance training;Stair training;Neuromuscular re-education;Functional mobility training;Therapeutic activities;Patient/family education    PT Goals (Current goals can be found in the Care Plan section)  Acute Rehab PT Goals Patient Stated Goal: to go home PT Goal Formulation: With patient Time For Goal Achievement: 07/25/21 Potential to Achieve Goals: Good    Frequency Min 2X/week     Co-evaluation               AM-PAC PT "6 Clicks" Mobility  Outcome Measure Help needed turning from your back to your side while in a flat bed without using bedrails?: None Help needed moving from lying on your back to sitting on the side of a flat bed without using bedrails?: None Help needed moving to and from a bed to a chair (including a wheelchair)?: None Help needed standing up from a chair using your arms (e.g., wheelchair or bedside chair)?: None Help needed to walk in hospital room?: None Help needed climbing 3-5 steps with a railing? : A Little 6 Click Score: 23    End of Session   Activity Tolerance: Patient tolerated treatment well Patient left: in chair;with chair alarm set;with call bell/phone within reach Nurse Communication: Mobility status PT Visit Diagnosis: Other abnormalities of gait and mobility (R26.89)    Time: 2831-5176 PT Time Calculation (min) (ACUTE ONLY): 21 min   Charges:   PT Evaluation $PT Eval Low Complexity: 1 Low PT Treatments $Therapeutic Activity: 8-22 mins        Olga Coaster PT, DPT 9:18 AM,07/11/21

## 2021-07-12 DIAGNOSIS — R6520 Severe sepsis without septic shock: Secondary | ICD-10-CM | POA: Diagnosis not present

## 2021-07-12 DIAGNOSIS — A419 Sepsis, unspecified organism: Secondary | ICD-10-CM | POA: Diagnosis not present

## 2021-07-12 DIAGNOSIS — E876 Hypokalemia: Secondary | ICD-10-CM | POA: Diagnosis not present

## 2021-07-12 DIAGNOSIS — A400 Sepsis due to streptococcus, group A: Secondary | ICD-10-CM | POA: Diagnosis not present

## 2021-07-12 DIAGNOSIS — N178 Other acute kidney failure: Secondary | ICD-10-CM

## 2021-07-12 DIAGNOSIS — B955 Unspecified streptococcus as the cause of diseases classified elsewhere: Secondary | ICD-10-CM | POA: Diagnosis not present

## 2021-07-12 HISTORY — DX: Hypokalemia: E87.6

## 2021-07-12 LAB — CBC
HCT: 24.6 % — ABNORMAL LOW (ref 36.0–46.0)
Hemoglobin: 8.2 g/dL — ABNORMAL LOW (ref 12.0–15.0)
MCH: 27.3 pg (ref 26.0–34.0)
MCHC: 33.3 g/dL (ref 30.0–36.0)
MCV: 82 fL (ref 80.0–100.0)
Platelets: 151 10*3/uL (ref 150–400)
RBC: 3 MIL/uL — ABNORMAL LOW (ref 3.87–5.11)
RDW: 13.7 % (ref 11.5–15.5)
WBC: 6.5 10*3/uL (ref 4.0–10.5)
nRBC: 0.3 % — ABNORMAL HIGH (ref 0.0–0.2)

## 2021-07-12 LAB — CULTURE, BLOOD (ROUTINE X 2)

## 2021-07-12 LAB — BASIC METABOLIC PANEL
Anion gap: 7 (ref 5–15)
BUN: 31 mg/dL — ABNORMAL HIGH (ref 8–23)
CO2: 24 mmol/L (ref 22–32)
Calcium: 9.1 mg/dL (ref 8.9–10.3)
Chloride: 110 mmol/L (ref 98–111)
Creatinine, Ser: 1.02 mg/dL — ABNORMAL HIGH (ref 0.44–1.00)
GFR, Estimated: 56 mL/min — ABNORMAL LOW (ref >60)
Glucose, Bld: 102 mg/dL — ABNORMAL HIGH (ref 70–99)
Potassium: 3.5 mmol/L (ref 3.5–5.1)
Sodium: 141 mmol/L (ref 135–145)

## 2021-07-12 LAB — IRON AND TIBC
Iron: 61 ug/dL (ref 28–170)
Saturation Ratios: 40 % — ABNORMAL HIGH (ref 10.4–31.8)
TIBC: 154 ug/dL — ABNORMAL LOW (ref 250–450)
UIBC: 93 ug/dL

## 2021-07-12 LAB — VITAMIN B12: Vitamin B-12: 480 pg/mL (ref 180–914)

## 2021-07-12 LAB — FERRITIN: Ferritin: 1282 ng/mL — ABNORMAL HIGH (ref 11–307)

## 2021-07-12 MED ORDER — POTASSIUM CHLORIDE 20 MEQ PO PACK
40.0000 meq | PACK | Freq: Once | ORAL | Status: AC
  Administered 2021-07-12: 40 meq via ORAL
  Filled 2021-07-12: qty 2

## 2021-07-12 NOTE — Progress Notes (Signed)
Physical Therapy Treatment Patient Details Name: Zoe Boyle MRN: [id number] DOB: 1940-06-11 Today's Date: 07/12/2021   History of Present Illness Pt is a 81 yo female that presented to ED for weakness. Workup showed group A streptococcus bacteremia, AKI on CKD. PMH of HTN, CHF, CKD.    PT Comments    Pt easily wakes to voice at start of session, agreeable to mobility, did not report pain. She was able to perform bed mobility and transfers modI, and progressed ambulation ~166ft with supervision-CGA. Some gait path deviations noted and 1-2 LOB but pt able to self correct. Pt educated on potential use of SPC at discharge but pt declined at this time. Pt up in chair with breakfast set up. The patient would benefit from further skilled PT intervention to continue to progress towards goals. Recommendation remains appropriate.     Recommendations for follow up therapy are one component of a multi-disciplinary discharge planning process, led by the attending physician.  Recommendations may be updated based on patient status, additional functional criteria and insurance authorization.  Follow Up Recommendations  Outpatient PT     Assistance Recommended at Discharge Intermittent Supervision/Assistance  Patient can return home with the following Assistance with cooking/housework;Help with stairs or ramp for entrance;Assist for transportation   Equipment Recommendations  None recommended by PT    Recommendations for Other Services       Precautions / Restrictions Precautions Precautions: Fall Restrictions Weight Bearing Restrictions: No     Mobility  Bed Mobility Overal bed mobility: Modified Independent                  Transfers Overall transfer level: Modified independent                      Ambulation/Gait Ambulation/Gait assistance: Supervision, Min guard Gait Distance (Feet): 170 Feet Assistive device: None         General Gait Details: mild gait path  deviations 1-2 LOB but pt able to self correct. limited vision on L eye due to swelling   Stairs             Wheelchair Mobility    Modified Rankin (Stroke Patients Only)       Balance Overall balance assessment: Needs assistance Sitting-balance support: Feet supported Sitting balance-Leahy Scale: Good     Standing balance support: No upper extremity supported Standing balance-Leahy Scale: Good                              Cognition Arousal/Alertness: Awake/alert Behavior During Therapy: WFL for tasks assessed/performed Overall Cognitive Status: Within Functional Limits for tasks assessed                                          Exercises      General Comments        Pertinent Vitals/Pain Pain Assessment Pain Assessment: No/denies pain    Home Living                          Prior Function            PT Goals (current goals can now be found in the care plan section) Progress towards PT goals: Progressing toward goals    Frequency    Min 2X/week  PT Plan Current plan remains appropriate    Co-evaluation              AM-PAC PT "6 Clicks" Mobility   Outcome Measure  Help needed turning from your back to your side while in a flat bed without using bedrails?: None Help needed moving from lying on your back to sitting on the side of a flat bed without using bedrails?: None Help needed moving to and from a bed to a chair (including a wheelchair)?: None Help needed standing up from a chair using your arms (e.g., wheelchair or bedside chair)?: None Help needed to walk in hospital room?: None Help needed climbing 3-5 steps with a railing? : A Little 6 Click Score: 23    End of Session Equipment Utilized During Treatment: Gait belt Activity Tolerance: Patient tolerated treatment well Patient left: in chair;with chair alarm set;with call bell/phone within reach Nurse Communication: Mobility  status PT Visit Diagnosis: Other abnormalities of gait and mobility (R26.89)     Time: [id number] PT Time Calculation (min) (ACUTE ONLY): 8 min  Charges:  $Therapeutic Activity: 8-22 mins                     Lieutenant Diego PT, DPT 10:27 AM,07/12/21

## 2021-07-12 NOTE — Hospital Course (Signed)
.  Zoe Boyle is a 81 y.o. female with medical history significant for Systolic heart failure,  CKD 3A, HTN, who presented by EMS from home 07/09/2021 for evaluation of a several day history of generalized weakness, with urinary and fecal incontinence but without diarrhea. Patient met sepsis criteria at admission with temperature 103.6, 24, heart rate 106, Patient was initially placed on broad-spectrum antibiotics with cefepime, vancomycin and metronidazole.  Blood culture came back positive for group A streptococcus.  Patient is seen by infectious disease, antibiotic was switched to ceftriaxone. Patient also had echocardiogram showed ejection fraction 25 to 30%.  Grade 1 diastolic dysfunction.

## 2021-07-12 NOTE — Progress Notes (Signed)
Date of Admission:  07/09/2021       Subjective: Pt is doing much better No fever since day 1 Appetite better Swelling left eye lid- improved  but no pain   Medications:   aspirin EC  81 mg Oral Daily   carvedilol  25 mg Oral BID WC   enoxaparin (LOVENOX) injection  40 mg Subcutaneous Q24H   potassium chloride  10 mEq Oral Daily   potassium chloride  40 mEq Oral Once   pravastatin  40 mg Oral Daily    Objective: Vital signs in last 24 hours: Temp:  [97.6 F (36.4 C)-98.7 F (37.1 C)] 98.7 F (37.1 C) (06/14 0825) Pulse Rate:  [68-100] 68 (06/14 0825) Resp:  [17-18] 18 (06/14 0825) BP: (98-129)/(72-94) 129/94 (06/14 0825) SpO2:  [98 %-100 %] 99 % (06/14 0825)   PHYSICAL EXAM:  Awake and alert 07/12/21    Eyes - PERL- FROM Swellinglids left better Chest b/l air entry [id number] Abd soft Ns non focal  Lab Results Recent Labs    07/11/21 0559 07/11/21 1824 07/12/21 0526  WBC 6.3  --  6.5  HGB 7.8*  --  8.2*  HCT 23.7*  --  24.6*  NA 142 141 141  K 3.8 3.3* 3.5  CL 113* 107 110  CO2 24 24 24   BUN 31* 33* 31*  CREATININE 1.10* 1.16* 1.02*   Liver Panel Recent Labs    07/09/21 2017  PROT 7.6  ALBUMIN 3.3*  AST 38  ALT 26  ALKPHOS 75  BILITOT 1.3*    Microbiology: 07/09/21 BC 2/2 Group A streptococcus 07/11/21 BC NG Studies/Results: ECHOCARDIOGRAM COMPLETE  Result Date: 07/10/2021    ECHOCARDIOGRAM REPORT   Patient Name:   Zoe Boyle Date of Exam: 07/10/2021 Medical Rec #:  [id number]  Height:       60.0 in Accession #:    [id number] Weight:       128.7 lb Date of Birth:  1940-11-02  BSA:          1.548 m Patient Age:    81 years   BP:           100/71 mmHg Patient Gender: F          HR:           76 bpm. Exam Location:  [facility] Procedure: 2D Echo, Color Doppler and Cardiac Doppler Indications:     I50.9 Congestive heart failure  History:         Patient has prior history of Echocardiogram examinations. CHF;                  Risk Factors:Hypertension and  Former Smoker.  Sonographer:     Rosalia Hammers Referring Phys:  [id number] Emeterio Reeve Diagnosing Phys: Yolonda Kida MD IMPRESSIONS  1. Left ventricular ejection fraction, by estimation, is 25 to 30%. The left ventricle has severely decreased function. The left ventricle demonstrates global hypokinesis. The left ventricular internal cavity size was moderately to severely dilated. There is mild left ventricular hypertrophy. Left ventricular diastolic parameters are consistent with Grade I diastolic dysfunction (impaired relaxation).  2. Right ventricular systolic function is low normal. The right ventricular size is mildly enlarged.  3. Left atrial size was mild to moderately dilated.  4. Right atrial size was mild to moderately dilated.  5. The mitral valve is grossly normal. 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  6. 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  7. The aortic valve  is grossly normal. Aortic valve regurgitation is mild. FINDINGS  Left Ventricle: Left ventricular ejection fraction, by estimation, is 25 to 30%. The left ventricle has severely decreased function. The left ventricle demonstrates global hypokinesis. The left ventricular internal cavity size was moderately to severely  dilated. There is mild left ventricular hypertrophy. Abnormal (paradoxical) septal motion, consistent with left bundle branch block. Left ventricular diastolic parameters are consistent with Grade I diastolic dysfunction (impaired relaxation). Right Ventricle: The right ventricular size is mildly enlarged. No increase in right ventricular wall thickness. Right ventricular systolic function is low normal. Left Atrium: Left atrial size was mild to moderately dilated. Right Atrium: Right atrial size was mild to moderately dilated. Pericardium: There is no evidence of pericardial effusion. Mitral Valve: The mitral valve is grossly normal. 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 MV peak gradient,  5.2 mmHg. The mean mitral valve gradient is 3.0 mmHg. Tricuspid Valve: The tricuspid valve is grossly normal. 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 Aortic Valve: The aortic valve is grossly normal. Aortic valve regurgitation is mild. Aortic valve mean gradient measures 12.0 mmHg. Aortic valve peak gradient measures 23.0 mmHg. Aortic valve area, by VTI measures 1.33 cm. Pulmonic Valve: The pulmonic valve was normal in structure. Pulmonic valve regurgitation is trivial. Aorta: The ascending aorta was not well visualized. IAS/Shunts: No atrial level shunt detected by color flow Doppler.  LEFT VENTRICLE PLAX 2D LVIDd:         5.67 cm      Diastology LVIDs:         4.88 cm      LV e' medial:    4.38 cm/s LV PW:         1.51 cm      LV E/e' medial:  25.6 LV IVS:        1.44 cm      LV e' lateral:   11.60 cm/s LVOT diam:     1.70 cm      LV E/e' lateral: 9.7 LV SV:         48 LV SV Index:   31 LVOT Area:     2.27 cm  LV Volumes (MOD) LV vol d, MOD A2C: 202.0 ml LV vol d, MOD A4C: 161.0 ml LV vol s, MOD A2C: 132.0 ml LV vol s, MOD A4C: 113.0 ml LV SV MOD A2C:     70.0 ml LV SV MOD A4C:     161.0 ml LV SV MOD BP:      54.3 ml RIGHT VENTRICLE RV Basal diam:  4.46 cm RV S prime:     11.70 cm/s LEFT ATRIUM              Index         RIGHT ATRIUM           Index LA diam:        5.40 cm  3.49 cm/m    RA Area:     28.40 cm LA Vol (A2C):   180.0 ml 116.30 ml/m  RA Volume:   88.90 ml  57.44 ml/m LA Vol (A4C):   163.0 ml 105.32 ml/m LA Biplane Vol: 173.0 ml 111.78 ml/m  AORTIC VALVE                     PULMONIC VALVE AV Area (Vmax):    1.22 cm      PV Vmax:          0.84 m/s AV Area (  Vmean):   1.31 cm      PV Vmean:         58.600 cm/s AV Area (VTI):     1.33 cm      PV VTI:           0.125 m AV Vmax:           240.00 cm/s   PV Peak grad:     2.8 mmHg AV Vmean:          158.000 cm/s  PV Mean grad:     2.0 mmHg AV VTI:            0.362 m       PR End Diast Vel: 6.05 msec AV Peak Grad:      23.0 mmHg AV Mean  Grad:      12.0 mmHg LVOT Vmax:         129.00 cm/s LVOT Vmean:        91.400 cm/s LVOT VTI:          0.212 m LVOT/AV VTI ratio: 0.59  AORTA Ao Root diam: 2.90 cm MITRAL VALVE                TRICUSPID VALVE MV Area (PHT): 3.24 cm     TR Peak grad:   23.0 mmHg MV Area VTI:   0.91 cm     TR Vmax:        240.00 cm/s MV Peak grad:  5.2 mmHg MV Mean grad:  3.0 mmHg     SHUNTS MV Vmax:       1.14 m/s     Systemic VTI:  0.21 m MV Vmean:      83.3 cm/s    Systemic Diam: 1.70 cm MV Decel Time: 234 msec MV E velocity: 112.00 cm/s MV A velocity: 123.00 cm/s MV E/A ratio:  0.91 Dwayne D Callwood MD Electronically signed by Yolonda Kida MD Signature Date/Time: 07/10/2021/5:36:48 PM    Final      Assessment/Plan: Group A streptococcus bacteremia No skin lesions Left eyelids edema - from dependent edema And fluids may be- no cellulitis- is improving Pt is currently on IV ceftriaxone- will need IV until 07/14/21 followed by PO amoxicillin until 07/19/21 Pt is stable with no septic shock   CHF- cardiomegaly with EF 25-30% On furosemide and coreg  Anemia  AKI on CKD- stable  Discussed the management with patient

## 2021-07-12 NOTE — Progress Notes (Addendum)
  Progress Note   Patient: Zoe Boyle HAF:[id number] DOB: 1940/07/30 DOA: 07/09/2021     3 DOS: the patient was seen and examined on 07/12/2021   Brief hospital course: .Zoe Boyle is a 81 y.o. female with medical history significant for Systolic heart failure,  CKD 3A, HTN, who presented by EMS from home 07/09/2021 for evaluation of a several day history of generalized weakness, with urinary and fecal incontinence but without diarrhea. Patient met sepsis criteria at admission with temperature 103.6, 24, heart rate 106, Patient was initially placed on broad-spectrum antibiotics with cefepime, vancomycin and metronidazole.  Blood culture came back positive for group A streptococcus.  Patient is seen by infectious disease, antibiotic was switched to ceftriaxone. Patient also had echocardiogram showed ejection fraction 25 to 30%.  Grade 1 diastolic dysfunction.  Assessment and Plan: Group A strep septicemia. Likely strep throat. Acute renal failure on chronic kidney stage IIIa secondary to septicemia. Patient condition much improved today, per ID consult, patient be continued on ceftriaxone.  Planning to give her the last dose tomorrow evening before discharge.  Followed by oral antibiotics.  Acute renal failure on chronic kidney disease stage IIIa. Hypokalemia. Renal function much improved.  Potassium is 3.5 today, continue to replete.  Chronic combined systolic and diastolic congestive heart failure. Stable, no volume overload.  Anemia of chronic disease. Patient has adequate iron and B12 level.  Follow CBC, transfuse as needed     Subjective: Feels much better today, denies any short of breath.  No fever or chills.  Physical Exam: Vitals:   07/11/21 1532 07/11/21 2003 07/12/21 0535 07/12/21 0825  BP: 98/72 116/84 119/84 (!) 129/94  Pulse: 71 77 100 68  Resp: $Remo'18 17 18 18  'GAkXS$ Temp: 97.6 F (36.4 C) 98.2 F (36.8 C) 98.1 F (36.7 C) 98.7 F (37.1 C)  TempSrc:  Oral    SpO2: 100% 98%  99% 99%  Weight:      Height:       General exam: Appears calm and comfortable  Respiratory system: Clear to auscultation. Respiratory effort normal. Cardiovascular system: S1 & S2 heard, RRR. No JVD, murmurs, rubs, gallops or clicks. No pedal edema. Gastrointestinal system: Abdomen is nondistended, soft and nontender. No organomegaly or masses felt. Normal bowel sounds heard. Central nervous system: Alert and oriented. No focal neurological deficits. Extremities: Symmetric 5 x 5 power. Skin: No rashes, lesions or ulcers Psychiatry: Judgement and insight appear normal. Mood & affect appropriate.  Left facial swelling without evidence of cellulitis  Data Reviewed:  Lab results reviewed.  Family Communication: Daughter updated at bedside  Disposition: Status is: Inpatient Remains inpatient appropriate because: Severity of disease, IV antibiotics.  Planned Discharge Destination: Home with Home Health    Time spent: 35 minutes  Author: Sharen Hones, MD 07/12/2021 11:21 AM  For on call review [email or web address].

## 2021-07-13 DIAGNOSIS — A400 Sepsis due to streptococcus, group A: Secondary | ICD-10-CM | POA: Diagnosis not present

## 2021-07-13 DIAGNOSIS — N179 Acute kidney failure, unspecified: Secondary | ICD-10-CM | POA: Diagnosis not present

## 2021-07-13 DIAGNOSIS — A419 Sepsis, unspecified organism: Secondary | ICD-10-CM | POA: Diagnosis not present

## 2021-07-13 DIAGNOSIS — R6520 Severe sepsis without septic shock: Secondary | ICD-10-CM | POA: Diagnosis not present

## 2021-07-13 LAB — CBC
HCT: 27.7 % — ABNORMAL LOW (ref 36.0–46.0)
Hemoglobin: 9.1 g/dL — ABNORMAL LOW (ref 12.0–15.0)
MCH: 27 pg (ref 26.0–34.0)
MCHC: 32.9 g/dL (ref 30.0–36.0)
MCV: 82.2 fL (ref 80.0–100.0)
Platelets: 216 K/uL (ref 150–400)
RBC: 3.37 MIL/uL — ABNORMAL LOW (ref 3.87–5.11)
RDW: 14 % (ref 11.5–15.5)
WBC: 7.3 K/uL (ref 4.0–10.5)
nRBC: 0.5 % — ABNORMAL HIGH (ref 0.0–0.2)

## 2021-07-13 LAB — BASIC METABOLIC PANEL
Anion gap: 6 (ref 5–15)
BUN: 32 mg/dL — ABNORMAL HIGH (ref 8–23)
CO2: 23 mmol/L (ref 22–32)
Calcium: 9.4 mg/dL (ref 8.9–10.3)
Chloride: 112 mmol/L — ABNORMAL HIGH (ref 98–111)
Creatinine, Ser: 0.83 mg/dL (ref 0.44–1.00)
GFR, Estimated: >60 mL/min (ref >60)
Glucose, Bld: 117 mg/dL — ABNORMAL HIGH (ref 70–99)
Potassium: 4.1 mmol/L (ref 3.5–5.1)
Sodium: 141 mmol/L (ref 135–145)

## 2021-07-13 LAB — MAGNESIUM: Magnesium: 2 mg/dL (ref 1.7–2.4)

## 2021-07-13 MED ORDER — AMOXICILLIN 875 MG PO TABS: 875.0000 mg | ORAL_TABLET | Freq: Two times a day (BID) | ORAL | 0 refills | Status: DC

## 2021-07-13 NOTE — Discharge Summary (Signed)
Physician Discharge Summary   Patient: Zoe Boyle MRN: [id number] DOB: 1940/11/03  Admit date:     07/09/2021  Discharge date: 07/13/21  Discharge Physician: Sharen Hones   PCP: [place]   Recommendations at discharge:   Follow-up with PCP in 1 week.  Discharge Diagnoses: Principal Problem:   Sepsis ([place]) Active Problems:   Acute renal failure superimposed on stage 3a chronic kidney disease (HCC)   Anemia of chronic disease   Chronic systolic CHF (congestive heart failure) (HCC)   Protein-calorie malnutrition, severe   Hypertension   Generalized weakness   Edema of face   Hypokalemia   Sepsis due to group A Streptococcus with acute renal failure without septic shock ([place])  Resolved Problems:   * No resolved hospital problems. [facility] Course: .Jimma Ortman is a 81 y.o. female with medical history significant for Systolic heart failure,  CKD 3A, HTN, who presented by EMS from home 07/09/2021 for evaluation of a several day history of generalized weakness, with urinary and fecal incontinence but without diarrhea. Patient met sepsis criteria at admission with temperature 103.6, 24, heart rate 106, Patient was initially placed on broad-spectrum antibiotics with cefepime, vancomycin and metronidazole.  Blood culture came back positive for group A streptococcus.  Patient is seen by infectious disease, antibiotic was switched to ceftriaxone. Patient also had echocardiogram showed ejection fraction 25 to 30%.  Grade 1 diastolic dysfunction.  Assessment and Plan: Group A strep septicemia. Likely strep throat. Acute renal failure on chronic kidney stage IIIa secondary to septicemia. Patient condition is improved.  Strep septicemia most likely secondary to strep throat. Patient has been treated with ceftriaxone, she will receive the final dose this evening and transition to oral amoxicillin for additional 5 days per recommendation from ID. Patient be followed by PCP in 1  week time.    Acute renal failure on chronic kidney disease stage IIIa. Hypokalemia. Renal function much improved.  Potassium normalized.   Chronic combined systolic and diastolic congestive heart failure. Resume home medicine.  I also decided to resume hydralazine which is ordered separately   Anemia of chronic disease. Patient has adequate iron and B12 level.  Hemoglobin has been stable      Consultants: ID Procedures performed: None  Disposition: Home Diet recommendation:  Discharge Diet Orders (From admission, onward)     Start     Ordered   07/13/21 0000  Diet - low sodium heart healthy        07/13/21 0958           Cardiac diet DISCHARGE MEDICATION: Allergies as of 07/13/2021       Reactions   Lisinopril Swelling   TONGUE SWELLED        Medication List     STOP taking these medications    hydrALAZINE 50 MG tablet Commonly known as: APRESOLINE   potassium chloride 10 MEQ tablet Commonly known as: KLOR-CON       TAKE these medications    amoxicillin 875 MG tablet Commonly known as: AMOXIL Take 1 tablet (875 mg total) by mouth 2 (two) times daily.   aspirin EC 81 MG tablet Take 81 mg by mouth daily.   carvedilol 25 MG tablet Commonly known as: COREG Take 25 mg by mouth in the morning and at bedtime.   furosemide 40 MG tablet Commonly known as: LASIX Take 40 mg by mouth daily.   pravastatin 40 MG tablet Commonly known as: PRAVACHOL Take 40 mg by mouth daily.  spironolactone 25 MG tablet Commonly known as: ALDACTONE Take 12.5 mg by mouth daily.        Follow-up Information     [place] Follow up in 1 week(s).   Contact information: [place]  [phone]                Discharge Exam: Danley Danker Weights   07/09/21 2021  Weight: 58.4 kg   General exam: Appears calm and comfortable  Respiratory system: Clear to auscultation. Respiratory effort normal. Cardiovascular  system: S1 & S2 heard, RRR. No JVD, murmurs, rubs, gallops or clicks. No pedal edema. Gastrointestinal system: Abdomen is nondistended, soft and nontender. No organomegaly or masses felt. Normal bowel sounds heard. Central nervous system: Alert and oriented. No focal neurological deficits. Extremities: Symmetric 5 x 5 power. Skin: No rashes, lesions or ulcers Psychiatry: Judgement and insight appear normal. Mood & affect appropriate.  Facial swelling much improved.  Condition at discharge: good  The results of significant diagnostics from this hospitalization (including imaging, microbiology, ancillary and laboratory) are listed below for reference.   Imaging Studies: ECHOCARDIOGRAM COMPLETE  Result Date: 07/10/2021    ECHOCARDIOGRAM REPORT   Patient Name:   Zoe Boyle Date of Exam: 07/10/2021 Medical Rec #:  [id number]  Height:       60.0 in Accession #:    [id number] Weight:       128.7 lb Date of Birth:  Aug 27, 1940  BSA:          1.548 m Patient Age:    3 years   BP:           100/71 mmHg Patient Gender: F          HR:           76 bpm. Exam Location:  [facility] Procedure: 2D Echo, Color Doppler and Cardiac Doppler Indications:     I50.9 Congestive heart failure  History:         Patient has prior history of Echocardiogram examinations. CHF;                  Risk Factors:Hypertension and Former Smoker.  Sonographer:     Rosalia Hammers Referring Phys:  [id number] Emeterio Reeve Diagnosing Phys: Yolonda Kida MD IMPRESSIONS  1. Left ventricular ejection fraction, by estimation, is 25 to 30%. The left ventricle has severely decreased function. The left ventricle demonstrates global hypokinesis. The left ventricular internal cavity size was moderately to severely dilated. There is mild left ventricular hypertrophy. Left ventricular diastolic parameters are consistent with Grade I diastolic dysfunction (impaired relaxation).  2. Right ventricular systolic function is low normal. The right ventricular size  is mildly enlarged.  3. Left atrial size was mild to moderately dilated.  4. Right atrial size was mild to moderately dilated.  5. The mitral valve is grossly normal. 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  6. 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  7. The aortic valve is grossly normal. Aortic valve regurgitation is mild. FINDINGS  Left Ventricle: Left ventricular ejection fraction, by estimation, is 25 to 30%. The left ventricle has severely decreased function. The left ventricle demonstrates global hypokinesis. The left ventricular internal cavity size was moderately to severely  dilated. There is mild left ventricular hypertrophy. Abnormal (paradoxical) septal motion, consistent with left bundle branch block. Left ventricular diastolic parameters are consistent with Grade I diastolic dysfunction (impaired relaxation). Right Ventricle: The right ventricular size is mildly enlarged. No increase in  right ventricular wall thickness. Right ventricular systolic function is low normal. Left Atrium: Left atrial size was mild to moderately dilated. Right Atrium: Right atrial size was mild to moderately dilated. Pericardium: There is no evidence of pericardial effusion. Mitral Valve: The mitral valve is grossly normal. 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 MV peak gradient, 5.2 mmHg. The mean mitral valve gradient is 3.0 mmHg. Tricuspid Valve: The tricuspid valve is grossly normal. 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 Aortic Valve: The aortic valve is grossly normal. Aortic valve regurgitation is mild. Aortic valve mean gradient measures 12.0 mmHg. Aortic valve peak gradient measures 23.0 mmHg. Aortic valve area, by VTI measures 1.33 cm. Pulmonic Valve: The pulmonic valve was normal in structure. Pulmonic valve regurgitation is trivial. Aorta: The ascending aorta was not well visualized. IAS/Shunts: No atrial level shunt detected by color flow Doppler.  LEFT VENTRICLE PLAX  2D LVIDd:         5.67 cm      Diastology LVIDs:         4.88 cm      LV e' medial:    4.38 cm/s LV PW:         1.51 cm      LV E/e' medial:  25.6 LV IVS:        1.44 cm      LV e' lateral:   11.60 cm/s LVOT diam:     1.70 cm      LV E/e' lateral: 9.7 LV SV:         48 LV SV Index:   31 LVOT Area:     2.27 cm  LV Volumes (MOD) LV vol d, MOD A2C: 202.0 ml LV vol d, MOD A4C: 161.0 ml LV vol s, MOD A2C: 132.0 ml LV vol s, MOD A4C: 113.0 ml LV SV MOD A2C:     70.0 ml LV SV MOD A4C:     161.0 ml LV SV MOD BP:      54.3 ml RIGHT VENTRICLE RV Basal diam:  4.46 cm RV S prime:     11.70 cm/s LEFT ATRIUM              Index         RIGHT ATRIUM           Index LA diam:        5.40 cm  3.49 cm/m    RA Area:     28.40 cm LA Vol (A2C):   180.0 ml 116.30 ml/m  RA Volume:   88.90 ml  57.44 ml/m LA Vol (A4C):   163.0 ml 105.32 ml/m LA Biplane Vol: 173.0 ml 111.78 ml/m  AORTIC VALVE                     PULMONIC VALVE AV Area (Vmax):    1.22 cm      PV Vmax:          0.84 m/s AV Area (Vmean):   1.31 cm      PV Vmean:         58.600 cm/s AV Area (VTI):     1.33 cm      PV VTI:           0.125 m AV Vmax:           240.00 cm/s   PV Peak grad:     2.8 mmHg AV Vmean:          158.000 cm/s  PV Mean grad:     2.0 mmHg AV VTI:            0.362 m       PR End Diast Vel: 6.05 msec AV Peak Grad:      23.0 mmHg AV Mean Grad:      12.0 mmHg LVOT Vmax:         129.00 cm/s LVOT Vmean:        91.400 cm/s LVOT VTI:          0.212 m LVOT/AV VTI ratio: 0.59  AORTA Ao Root diam: 2.90 cm MITRAL VALVE                TRICUSPID VALVE MV Area (PHT): 3.24 cm     TR Peak grad:   23.0 mmHg MV Area VTI:   0.91 cm     TR Vmax:        240.00 cm/s MV Peak grad:  5.2 mmHg MV Mean grad:  3.0 mmHg     SHUNTS MV Vmax:       1.14 m/s     Systemic VTI:  0.21 m MV Vmean:      83.3 cm/s    Systemic Diam: 1.70 cm MV Decel Time: 234 msec MV E velocity: 112.00 cm/s MV A velocity: 123.00 cm/s MV E/A ratio:  0.91 Dwayne D Callwood MD Electronically signed by Yolonda Kida MD Signature Date/Time: 07/10/2021/5:36:48 PM    Final    CT ABDOMEN PELVIS WO CONTRAST  Result Date: 07/09/2021 CLINICAL DATA:  Diarrhea dairrhea, fever, sepsis of unknown origin EXAM: CT ABDOMEN AND PELVIS WITHOUT CONTRAST TECHNIQUE: Multidetector CT imaging of the abdomen and pelvis was performed following the standard protocol without IV contrast. RADIATION DOSE REDUCTION: This exam was performed according to the departmental dose-optimization program which includes automated exposure control, adjustment of the mA and/or kV according to patient size and/or use of iterative reconstruction technique. COMPARISON:  None Available. FINDINGS: Lower chest: Trace bilateral pleural effusions. Marked cardiomegaly. Small pericardial effusion. Hypoattenuation of the cardiac blood pool is in keeping with at least moderate anemia. Hepatobiliary: Cholelithiasis without pericholecystic inflammatory change. Liver unremarkable. No definite intra or extrahepatic biliary ductal dilation on this noncontrast examination. Pancreas: Unremarkable Spleen: Unremarkable Adrenals/Urinary Tract: Adrenal glands are unremarkable. Kidneys are normal, without renal calculi, focal lesion, or hydronephrosis. Bladder is unremarkable. Stomach/Bowel: The stomach, small bowel, and large bowel are unremarkable on this noncontrast examination. The appendix is not clearly identified, however, no secondary signs of appendicitis are seen within the right lower quadrant of the abdomen. Trace ascites. No free intraperitoneal gas. Vascular/Lymphatic: Aortic atherosclerosis. No enlarged abdominal or pelvic lymph nodes. 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 Pelvic organs are otherwise unremarkable. Other: No abdominal wall hernia. Musculoskeletal: No acute bone abnormality. Degenerative changes are seen within the lumbar spine. IMPRESSION: No acute intra-abdominal pathology identified. No definite  radiographic explanation for the patient's reported symptoms. Marked cardiomegaly. Moderate anemia. Small pericardial effusion, trace effusions, and trace ascites may reflect changes of cardiogenic failure and/or anasarca. Cholelithiasis Aortic Atherosclerosis (ICD10-I70.0). Electronically Signed   By: Fidela Salisbury M.D.   On: 07/09/2021 22:18   DG Chest Port 1 View  Result Date: 07/09/2021 CLINICAL DATA:  Question sepsis EXAM: PORTABLE CHEST 1 VIEW COMPARISON:  07/29/2020 FINDINGS: Cardiac silhouette is markedly enlarged. No change from comparison exam. Central venous congestion is mild. No pleural fluid. No pneumothorax. IMPRESSION: Marked cardiomegaly not changed from comparison exam. No acute  findings. Electronically Signed   By: Suzy Bouchard M.D.   On: 07/09/2021 20:59    Microbiology: Results for orders placed or performed during the hospital encounter of 07/09/21  Resp Panel by RT-PCR (Flu A&B, Covid) Anterior Nasal Swab     Status: None   Collection Time: 07/09/21  8:17 PM   Specimen: Anterior Nasal Swab  Result Value Ref Range Status   SARS Coronavirus 2 by RT PCR NEGATIVE NEGATIVE Final    Comment: (NOTE) SARS-CoV-2 target nucleic acids are NOT DETECTED.  The SARS-CoV-2 RNA is generally detectable in upper respiratory specimens during the acute phase of infection. The lowest concentration of SARS-CoV-2 viral copies this assay can detect is 138 copies/mL. A negative result does not preclude SARS-Cov-2 infection and should not be used as the sole basis for treatment or other patient management decisions. A negative result may occur with  improper specimen collection/handling, submission of specimen other than nasopharyngeal swab, presence of viral mutation(s) within the areas targeted by this assay, and inadequate number of viral copies(<138 copies/mL). A negative result must be combined with clinical observations, patient history, and epidemiological information. The expected  result is Negative.  Fact Sheet for Patients:  EntrepreneurPulse.com.au  Fact Sheet for Healthcare Providers:  IncredibleEmployment.be  This test is no t yet approved or cleared by the [place] FDA and  has been authorized for detection and/or diagnosis of SARS-CoV-2 by FDA under an Emergency Use Authorization (EUA). This EUA will remain  in effect (meaning this test can be used) for the duration of the COVID-19 declaration under Section 564(b)(1) of the Act, 21 U.S.C.section 360bbb-3(b)(1), unless the authorization is terminated  or revoked sooner.       Influenza A by PCR NEGATIVE NEGATIVE Final   Influenza B by PCR NEGATIVE NEGATIVE Final    Comment: (NOTE) The Xpert Xpress SARS-CoV-2/FLU/RSV plus assay is intended as an aid in the diagnosis of influenza from Nasopharyngeal swab specimens and should not be used as a sole basis for treatment. Nasal washings and aspirates are unacceptable for Xpert Xpress SARS-CoV-2/FLU/RSV testing.  Fact Sheet for Patients: EntrepreneurPulse.com.au  Fact Sheet for Healthcare Providers: IncredibleEmployment.be  This test is not yet approved or cleared by the [place] FDA and has been authorized for detection and/or diagnosis of SARS-CoV-2 by FDA under an Emergency Use Authorization (EUA). This EUA will remain in effect (meaning this test can be used) for the duration of the COVID-19 declaration under Section 564(b)(1) of the Act, 21 U.S.C. section 360bbb-3(b)(1), unless the authorization is terminated or revoked.  Performed at [facility], [address] [place], [place] [address]   Blood Culture (routine x 2)     Status: Abnormal   Collection Time: 07/09/21  8:17 PM   Specimen: BLOOD  Result Value Ref Range Status   Specimen Description   Final    BLOOD RFOA Performed at [facility], [address] [place], [place] [address]     Special Requests   Final    BOTTLES DRAWN AEROBIC AND ANAEROBIC BCAV Performed at [facility], [address], [place], [place] [address]    Culture  Setup Time   Final    AEROBIC BOTTLE ONLY GRAM POSITIVE COCCI Organism ID to follow CRITICAL RESULT CALLED TO, READ BACK BY AND VERIFIED WITH: MORGAN HICKS ON 07/10/21@ 1257 RP  Performed at [facility], [address] [place], [place] [address]    Culture (A)  Final    GROUP A  STREP (S.PYOGENES) ISOLATED HEALTH DEPARTMENT NOTIFIED Performed at [facility], [place] [address] [place], [place] [address]    Report Status 07/12/2021 FINAL  Final   Organism ID, Bacteria GROUP A STREP (S.PYOGENES) ISOLATED  Final      Susceptibility   Group a strep (s.pyogenes) isolated - MIC*    PENICILLIN <=0.06 SENSITIVE Sensitive     CEFTRIAXONE <=0.12 SENSITIVE Sensitive     ERYTHROMYCIN <=0.12 SENSITIVE Sensitive     LEVOFLOXACIN 0.5 SENSITIVE Sensitive     VANCOMYCIN 0.25 SENSITIVE Sensitive     * GROUP A STREP (S.PYOGENES) ISOLATED  Urine Culture     Status: None   Collection Time: 07/09/21  8:17 PM   Specimen: Urine, Random  Result Value Ref Range Status   Specimen Description   Final    URINE, RANDOM Performed at [facility], [address] [place], [place] [address]    Special Requests   Final    NONE Performed at [facility], [address], [place], [place] [address]    Culture   Final    NO GROWTH Performed at [facility], [place] [address], [place], [place] [address]    Report Status 07/11/2021 FINAL  Final  Blood Culture ID Panel (Reflexed)     Status: Abnormal   Collection Time: 07/09/21  8:17 PM  Result Value Ref Range Status   Enterococcus faecalis NOT DETECTED NOT DETECTED Final   Enterococcus Faecium NOT DETECTED NOT DETECTED Final   Listeria monocytogenes NOT DETECTED NOT DETECTED Final   Staphylococcus species NOT DETECTED NOT DETECTED Final   Staphylococcus aureus  (BCID) NOT DETECTED NOT DETECTED Final   Staphylococcus epidermidis NOT DETECTED NOT DETECTED Final   Staphylococcus lugdunensis NOT DETECTED NOT DETECTED Final   Streptococcus species DETECTED (A) NOT DETECTED Final    Comment: CRITICAL RESULT CALLED TO, READ BACK BY AND VERIFIED WITH: MORGAN HICKS ON 07/10/21 $RemoveBe'@1257'[email or web address]$  RP    Streptococcus agalactiae NOT DETECTED NOT DETECTED Final   Streptococcus pneumoniae NOT DETECTED NOT DETECTED Final   Streptococcus pyogenes DETECTED (A) NOT DETECTED Final    Comment: CRITICAL RESULT CALLED TO, READ BACK BY AND VERIFIED WITH: MORGAN HICKS ON 07/10/21 $RemoveBe'@1257'[email or web address]$  RP    A.calcoaceticus-baumannii NOT DETECTED NOT DETECTED Final   Bacteroides fragilis NOT DETECTED NOT DETECTED Final   Enterobacterales NOT DETECTED NOT DETECTED Final   Enterobacter cloacae complex NOT DETECTED NOT DETECTED Final   Escherichia coli NOT DETECTED NOT DETECTED Final   Klebsiella aerogenes NOT DETECTED NOT DETECTED Final   Klebsiella oxytoca NOT DETECTED NOT DETECTED Final   Klebsiella pneumoniae NOT DETECTED NOT DETECTED Final   Proteus species NOT DETECTED NOT DETECTED Final   Salmonella species NOT DETECTED NOT DETECTED Final   Serratia marcescens NOT DETECTED NOT DETECTED Final   Haemophilus influenzae NOT DETECTED NOT DETECTED Final   Neisseria meningitidis NOT DETECTED NOT DETECTED Final   Pseudomonas aeruginosa NOT DETECTED NOT DETECTED Final   Stenotrophomonas maltophilia NOT DETECTED NOT DETECTED Final   Candida albicans NOT DETECTED NOT DETECTED Final   Candida auris NOT DETECTED NOT DETECTED Final   Candida glabrata NOT DETECTED NOT DETECTED Final   Candida krusei NOT DETECTED NOT DETECTED Final   Candida parapsilosis NOT DETECTED NOT DETECTED Final   Candida tropicalis NOT DETECTED NOT DETECTED Final   Cryptococcus neoformans/gattii NOT DETECTED NOT DETECTED Final    Comment: Performed at [facility], [address] [place], [place] [address]  Blood  Culture (  routine x 2)     Status: None (Preliminary result)   Collection Time: 07/09/21  8:22 PM   Specimen: BLOOD  Result Value Ref Range Status   Specimen Description BLOOD LFOA  Final   Special Requests BOTTLES DRAWN AEROBIC AND ANAEROBIC BCAV  Final   Culture   Final    NO GROWTH 4 DAYS Performed at [facility], [address] [place], [place] [address]    Report Status PENDING  Incomplete  Gastrointestinal Panel by PCR , Stool     Status: None   Collection Time: 07/10/21  5:37 PM   Specimen: STOOL  Result Value Ref Range Status   Campylobacter species NOT DETECTED NOT DETECTED Final   Plesimonas shigelloides NOT DETECTED NOT DETECTED Final   Salmonella species NOT DETECTED NOT DETECTED Final   Yersinia enterocolitica NOT DETECTED NOT DETECTED Final   Vibrio species NOT DETECTED NOT DETECTED Final   Vibrio cholerae NOT DETECTED NOT DETECTED Final   Enteroaggregative E coli (EAEC) NOT DETECTED NOT DETECTED Final   Enteropathogenic E coli (EPEC) NOT DETECTED NOT DETECTED Final   Enterotoxigenic E coli (ETEC) NOT DETECTED NOT DETECTED Final   Shiga like toxin producing E coli (STEC) NOT DETECTED NOT DETECTED Final   Shigella/Enteroinvasive E coli (EIEC) NOT DETECTED NOT DETECTED Final   Cryptosporidium NOT DETECTED NOT DETECTED Final   Cyclospora cayetanensis NOT DETECTED NOT DETECTED Final   Entamoeba histolytica NOT DETECTED NOT DETECTED Final   Giardia lamblia NOT DETECTED NOT DETECTED Final   Adenovirus F40/41 NOT DETECTED NOT DETECTED Final   Astrovirus NOT DETECTED NOT DETECTED Final   Norovirus GI/GII NOT DETECTED NOT DETECTED Final   Rotavirus A NOT DETECTED NOT DETECTED Final   Sapovirus (I, II, IV, and V) NOT DETECTED NOT DETECTED Final    Comment: Performed at [facility], [address] [place], [place] [address]  C Difficile Quick Screen w PCR reflex     Status: None   Collection Time: 07/10/21  5:37 PM   Specimen: STOOL  Result Value Ref  Range Status   C Diff antigen NEGATIVE NEGATIVE Final   C Diff toxin NEGATIVE NEGATIVE Final   C Diff interpretation No C. difficile detected.  Final    Comment: Performed at [facility], [address] [place], [place] [address]  Culture, blood (Routine X 2) w Reflex to ID Panel     Status: None (Preliminary result)   Collection Time: 07/11/21  5:59 AM   Specimen: BLOOD  Result Value Ref Range Status   Specimen Description BLOOD RIGHT [facility]  Final   Special Requests   Final    BOTTLES DRAWN AEROBIC AND ANAEROBIC Blood Culture adequate volume   Culture   Final    NO GROWTH 2 DAYS Performed at [facility], [address] [place], [place] [address]    Report Status PENDING  Incomplete  Culture, blood (Routine X 2) w Reflex to ID Panel     Status: None (Preliminary result)   Collection Time: 07/11/21  6:00 AM   Specimen: BLOOD  Result Value Ref Range Status   Specimen Description BLOOD LEFT HAND  Final   Special Requests   Final    BOTTLES DRAWN AEROBIC AND ANAEROBIC Blood Culture adequate volume   Culture   Final    NO GROWTH 2 DAYS Performed at [facility], [address], [place], [place] [address]    Report Status PENDING  Incomplete    Labs:  CBC: Recent Labs  Lab 07/09/21 2017 07/10/21 0630 07/11/21 0559 07/12/21 0526 07/13/21 0611  WBC 8.0 7.8 6.3 6.5 7.3  NEUTROABS 6.7  --   --   --   --   HGB 8.4* 8.1* 7.8* 8.2* 9.1*  HCT 26.3* 24.0* 23.7* 24.6* 27.7*  MCV 85.7 83.0 82.9 82.0 82.2  PLT 100* 92* 98* 151 992   Basic Metabolic Panel: Recent Labs  Lab 07/09/21 2017 07/10/21 0630 07/11/21 0559 07/11/21 1824 07/12/21 0526 07/13/21 0611  NA 141  --  142 141 141 141  K 3.2*  --  3.8 3.3* 3.5 4.1  CL 110  --  113* 107 110 112*  CO2 21*  --  $R'24 24 24 23  'Et$ GLUCOSE 158*  --  126* 107* 102* 117*  BUN 34*  --  31* 33* 31* 32*  CREATININE 1.74* 1.23* 1.10* 1.16* 1.02* 0.83  CALCIUM 9.0  --  9.2 9.1 9.1 9.4  MG  --   --   --   --    --  2.0   Liver Function Tests: Recent Labs  Lab 07/09/21 2017  AST 38  ALT 26  ALKPHOS 75  BILITOT 1.3*  PROT 7.6  ALBUMIN 3.3*   CBG: No results for input(s): "GLUCAP" in the last 168 hours.  Discharge time spent: greater than 30 minutes.  Signed: Sharen Hones, MD [facility] 07/13/2021

## 2021-07-13 NOTE — Progress Notes (Signed)
Pt A/Ox4 upon review of AVS. PIV removed. Assisted patient with getting dressed. [place], daughter phoned to pick patient up. Will escort with wheelchair when ready. IV Antibiotic administered successfully.

## 2021-07-13 NOTE — Care Management Important Message (Signed)
Important Message  Patient Details  Name: Zoe Boyle MRN: [id number] Date of Birth: [birth year]   Medicare Important Message Given:  N/A - LOS <3 / Initial given by admissions     Olegario Messier A Vian Fluegel 07/13/2021, 9:39 AM

## 2021-07-14 LAB — CULTURE, BLOOD (ROUTINE X 2): Culture: NO GROWTH

## 2021-07-16 LAB — CULTURE, BLOOD (ROUTINE X 2)
Culture: NO GROWTH
Culture: NO GROWTH
Special Requests: ADEQUATE
Special Requests: ADEQUATE

## 2021-07-31 ENCOUNTER — Other Ambulatory Visit: Payer: Self-pay

## 2021-07-31 ENCOUNTER — Emergency Department: Payer: Medicare Other

## 2021-07-31 DIAGNOSIS — Z888 Allergy status to other drugs, medicaments and biological substances status: Secondary | ICD-10-CM

## 2021-07-31 DIAGNOSIS — E785 Hyperlipidemia, unspecified: Secondary | ICD-10-CM | POA: Diagnosis present

## 2021-07-31 DIAGNOSIS — Z87891 Personal history of nicotine dependence: Secondary | ICD-10-CM

## 2021-07-31 DIAGNOSIS — I3139 Other pericardial effusion (noninflammatory): Secondary | ICD-10-CM | POA: Diagnosis present

## 2021-07-31 DIAGNOSIS — E440 Moderate protein-calorie malnutrition: Secondary | ICD-10-CM | POA: Diagnosis present

## 2021-07-31 DIAGNOSIS — Z79899 Other long term (current) drug therapy: Secondary | ICD-10-CM

## 2021-07-31 DIAGNOSIS — I081 Rheumatic disorders of both mitral and tricuspid valves: Secondary | ICD-10-CM | POA: Diagnosis present

## 2021-07-31 DIAGNOSIS — I130 Hypertensive heart and chronic kidney disease with heart failure and stage 1 through stage 4 chronic kidney disease, or unspecified chronic kidney disease: Principal | ICD-10-CM | POA: Diagnosis present

## 2021-07-31 DIAGNOSIS — I5043 Acute on chronic combined systolic (congestive) and diastolic (congestive) heart failure: Secondary | ICD-10-CM | POA: Diagnosis present

## 2021-07-31 DIAGNOSIS — Z7982 Long term (current) use of aspirin: Secondary | ICD-10-CM

## 2021-07-31 DIAGNOSIS — R54 Age-related physical debility: Secondary | ICD-10-CM | POA: Diagnosis present

## 2021-07-31 DIAGNOSIS — I5A Non-ischemic myocardial injury (non-traumatic): Secondary | ICD-10-CM | POA: Diagnosis present

## 2021-07-31 DIAGNOSIS — N1831 Chronic kidney disease, stage 3a: Secondary | ICD-10-CM | POA: Diagnosis present

## 2021-07-31 DIAGNOSIS — R0602 Shortness of breath: Secondary | ICD-10-CM | POA: Diagnosis not present

## 2021-07-31 DIAGNOSIS — Z20822 Contact with and (suspected) exposure to covid-19: Secondary | ICD-10-CM | POA: Diagnosis present

## 2021-07-31 DIAGNOSIS — D631 Anemia in chronic kidney disease: Secondary | ICD-10-CM | POA: Diagnosis present

## 2021-07-31 DIAGNOSIS — I447 Left bundle-branch block, unspecified: Secondary | ICD-10-CM | POA: Diagnosis present

## 2021-07-31 DIAGNOSIS — Z6825 Body mass index (BMI) 25.0-25.9, adult: Secondary | ICD-10-CM

## 2021-07-31 DIAGNOSIS — N179 Acute kidney failure, unspecified: Secondary | ICD-10-CM | POA: Diagnosis present

## 2021-07-31 DIAGNOSIS — F32A Depression, unspecified: Secondary | ICD-10-CM | POA: Diagnosis present

## 2021-07-31 LAB — CBC WITH DIFFERENTIAL/PLATELET
Abs Immature Granulocytes: 0.05 10*3/uL (ref 0.00–0.07)
Basophils Absolute: 0.1 10*3/uL (ref 0.0–0.1)
Basophils Relative: 1 %
Eosinophils Absolute: 0.1 10*3/uL (ref 0.0–0.5)
Eosinophils Relative: 1 %
HCT: 31.8 % — ABNORMAL LOW (ref 36.0–46.0)
Hemoglobin: 10.2 g/dL — ABNORMAL LOW (ref 12.0–15.0)
Immature Granulocytes: 1 %
Lymphocytes Relative: 44 %
Lymphs Abs: 2.5 10*3/uL (ref 0.7–4.0)
MCH: 27.7 pg (ref 26.0–34.0)
MCHC: 32.1 g/dL (ref 30.0–36.0)
MCV: 86.4 fL (ref 80.0–100.0)
Monocytes Absolute: 0.2 10*3/uL (ref 0.1–1.0)
Monocytes Relative: 4 %
Neutro Abs: 2.8 10*3/uL (ref 1.7–7.7)
Neutrophils Relative %: 49 %
Platelets: 176 10*3/uL (ref 150–400)
RBC: 3.68 MIL/uL — ABNORMAL LOW (ref 3.87–5.11)
RDW: 14.6 % (ref 11.5–15.5)
WBC: 5.7 10*3/uL (ref 4.0–10.5)
nRBC: 0 % (ref 0.0–0.2)

## 2021-07-31 NOTE — ED Provider Notes (Signed)
Emergency Medicine Provider Triage Evaluation Note  Zoe Boyle , a 81 y.o. female  was evaluated in triage.  Pt complains of SOB/weakness. Not eating x 1 day. Recently started on appetite stimulant but not working.   Review of Systems  Positive: SOB, weakness, nausea, decreased appetite  Negative: Chest pain, dizziness  Physical Exam  There were no vitals taken for this visit. Gen:   Awake, no distress   Resp:  Normal effort no increased woib MSK:   Moves extremities without difficulty  Other:  No edema  Medical Decision Making  Medically screening exam initiated at 11:25 PM.  Appropriate orders placed.  Donna Snooks was informed that the remainder of the evaluation will be completed by another provider, this initial triage assessment does not replace that evaluation, and the importance of remaining in the ED until their evaluation is complete.    Georga Hacking, MD 07/31/21 [phone]

## 2021-07-31 NOTE — ED Triage Notes (Signed)
Pt presents to ER from home with c/o weakness and sob that started yesterday.  Pt has hx of CHF and takes lasix at home and has been compliant.  Pt denies sick contacts, and covid symptoms, or chest pain, or recent weight gain/swelling.  Pt A&O x4 at this time in NAD in triage.

## 2021-08-01 ENCOUNTER — Inpatient Hospital Stay
Admission: EM | Admit: 2021-08-01 | Discharge: 2021-08-04 | DRG: 291 | Disposition: A | Discharge status: Home / Self Care | Payer: Medicare Other | Attending: Internal Medicine | Admitting: Internal Medicine

## 2021-08-01 ENCOUNTER — Encounter: Payer: Self-pay | Admitting: Internal Medicine

## 2021-08-01 ENCOUNTER — Emergency Department: Payer: Medicare Other

## 2021-08-01 DIAGNOSIS — F32A Depression, unspecified: Secondary | ICD-10-CM | POA: Diagnosis present

## 2021-08-01 DIAGNOSIS — R778 Other specified abnormalities of plasma proteins: Secondary | ICD-10-CM

## 2021-08-01 DIAGNOSIS — N179 Acute kidney failure, unspecified: Secondary | ICD-10-CM | POA: Diagnosis present

## 2021-08-01 DIAGNOSIS — I081 Rheumatic disorders of both mitral and tricuspid valves: Secondary | ICD-10-CM | POA: Diagnosis present

## 2021-08-01 DIAGNOSIS — I5023 Acute on chronic systolic (congestive) heart failure: Secondary | ICD-10-CM | POA: Diagnosis present

## 2021-08-01 DIAGNOSIS — Z6825 Body mass index (BMI) 25.0-25.9, adult: Secondary | ICD-10-CM | POA: Diagnosis not present

## 2021-08-01 DIAGNOSIS — E785 Hyperlipidemia, unspecified: Secondary | ICD-10-CM | POA: Diagnosis present

## 2021-08-01 DIAGNOSIS — Z20822 Contact with and (suspected) exposure to covid-19: Secondary | ICD-10-CM | POA: Diagnosis present

## 2021-08-01 DIAGNOSIS — I3139 Other pericardial effusion (noninflammatory): Secondary | ICD-10-CM | POA: Diagnosis present

## 2021-08-01 DIAGNOSIS — I5043 Acute on chronic combined systolic (congestive) and diastolic (congestive) heart failure: Secondary | ICD-10-CM

## 2021-08-01 DIAGNOSIS — Z7982 Long term (current) use of aspirin: Secondary | ICD-10-CM | POA: Diagnosis not present

## 2021-08-01 DIAGNOSIS — I10 Essential (primary) hypertension: Secondary | ICD-10-CM

## 2021-08-01 DIAGNOSIS — I5A Non-ischemic myocardial injury (non-traumatic): Secondary | ICD-10-CM

## 2021-08-01 DIAGNOSIS — E440 Moderate protein-calorie malnutrition: Secondary | ICD-10-CM | POA: Diagnosis present

## 2021-08-01 DIAGNOSIS — Z7189 Other specified counseling: Secondary | ICD-10-CM | POA: Diagnosis not present

## 2021-08-01 DIAGNOSIS — I447 Left bundle-branch block, unspecified: Secondary | ICD-10-CM | POA: Diagnosis present

## 2021-08-01 DIAGNOSIS — N1831 Chronic kidney disease, stage 3a: Secondary | ICD-10-CM | POA: Diagnosis present

## 2021-08-01 DIAGNOSIS — R54 Age-related physical debility: Secondary | ICD-10-CM | POA: Diagnosis present

## 2021-08-01 DIAGNOSIS — D631 Anemia in chronic kidney disease: Secondary | ICD-10-CM | POA: Diagnosis present

## 2021-08-01 DIAGNOSIS — I509 Heart failure, unspecified: Principal | ICD-10-CM

## 2021-08-01 DIAGNOSIS — D638 Anemia in other chronic diseases classified elsewhere: Secondary | ICD-10-CM | POA: Diagnosis present

## 2021-08-01 DIAGNOSIS — I130 Hypertensive heart and chronic kidney disease with heart failure and stage 1 through stage 4 chronic kidney disease, or unspecified chronic kidney disease: Secondary | ICD-10-CM | POA: Diagnosis present

## 2021-08-01 DIAGNOSIS — Z79899 Other long term (current) drug therapy: Secondary | ICD-10-CM | POA: Diagnosis not present

## 2021-08-01 DIAGNOSIS — R0602 Shortness of breath: Secondary | ICD-10-CM | POA: Diagnosis present

## 2021-08-01 DIAGNOSIS — Z87891 Personal history of nicotine dependence: Secondary | ICD-10-CM | POA: Diagnosis not present

## 2021-08-01 DIAGNOSIS — Z888 Allergy status to other drugs, medicaments and biological substances status: Secondary | ICD-10-CM | POA: Diagnosis not present

## 2021-08-01 HISTORY — DX: Chronic kidney disease, stage 3 unspecified: N18.30

## 2021-08-01 HISTORY — DX: Hyperlipidemia, unspecified: E78.5

## 2021-08-01 HISTORY — DX: Chronic combined systolic (congestive) and diastolic (congestive) heart failure: I50.42

## 2021-08-01 HISTORY — DX: Anemia, unspecified: D64.9

## 2021-08-01 LAB — COMPREHENSIVE METABOLIC PANEL
ALT: 17 U/L (ref 0–44)
AST: 28 U/L (ref 15–41)
Albumin: 4.2 g/dL (ref 3.5–5.0)
Alkaline Phosphatase: 61 U/L (ref 38–126)
Anion gap: 15 (ref 5–15)
BUN: 43 mg/dL — ABNORMAL HIGH (ref 8–23)
CO2: 18 mmol/L — ABNORMAL LOW (ref 22–32)
Calcium: 9.3 mg/dL (ref 8.9–10.3)
Chloride: 110 mmol/L (ref 98–111)
Creatinine, Ser: 1.95 mg/dL — ABNORMAL HIGH (ref 0.44–1.00)
GFR, Estimated: 26 mL/min — ABNORMAL LOW (ref >60)
Glucose, Bld: 151 mg/dL — ABNORMAL HIGH (ref 70–99)
Potassium: 3.9 mmol/L (ref 3.5–5.1)
Sodium: 143 mmol/L (ref 135–145)
Total Bilirubin: 1.4 mg/dL — ABNORMAL HIGH (ref 0.3–1.2)
Total Protein: 8.3 g/dL — ABNORMAL HIGH (ref 6.5–8.1)

## 2021-08-01 LAB — LIPID PANEL
Cholesterol: 171 mg/dL (ref 0–200)
HDL: 46 mg/dL (ref >40)
LDL Cholesterol: 111 mg/dL — ABNORMAL HIGH (ref 0–99)
Total CHOL/HDL Ratio: 3.7 RATIO
Triglycerides: 68 mg/dL (ref <150)
VLDL: 14 mg/dL (ref 0–40)

## 2021-08-01 LAB — HEMOGLOBIN A1C
Hgb A1c MFr Bld: 5.2 % (ref 4.8–5.6)
Mean Plasma Glucose: 102.54 mg/dL

## 2021-08-01 LAB — BRAIN NATRIURETIC PEPTIDE: B Natriuretic Peptide: >4500 pg/mL — ABNORMAL HIGH (ref 0.0–100.0)

## 2021-08-01 LAB — TROPONIN I (HIGH SENSITIVITY)
Troponin I (High Sensitivity): 84 ng/L — ABNORMAL HIGH (ref <18)
Troponin I (High Sensitivity): 87 ng/L — ABNORMAL HIGH (ref <18)
Troponin I (High Sensitivity): 89 ng/L — ABNORMAL HIGH (ref <18)
Troponin I (High Sensitivity): 90 ng/L — ABNORMAL HIGH (ref <18)

## 2021-08-01 LAB — SARS CORONAVIRUS 2 BY RT PCR: SARS Coronavirus 2 by RT PCR: NEGATIVE

## 2021-08-01 MED ORDER — HYDRALAZINE HCL 20 MG/ML IJ SOLN
5.0000 mg | INTRAMUSCULAR | Status: DC | PRN
Start: 2021-08-01 — End: 2021-08-04

## 2021-08-01 MED ORDER — ACETAMINOPHEN 325 MG PO TABS
650.0000 mg | ORAL_TABLET | Freq: Four times a day (QID) | ORAL | Status: DC | PRN
  Administered 2021-08-02 (×2): 650 mg via ORAL
  Filled 2021-08-01 (×2): qty 2

## 2021-08-01 MED ORDER — DM-GUAIFENESIN ER 30-600 MG PO TB12: 1.0000 | ORAL_TABLET | Freq: Two times a day (BID) | ORAL | Status: DC | PRN

## 2021-08-01 MED ORDER — MIRTAZAPINE 15 MG PO TABS
15.0000 mg | ORAL_TABLET | Freq: Every day | ORAL | Status: DC
  Administered 2021-08-01 – 2021-08-03 (×3): 15 mg via ORAL
  Filled 2021-08-01 (×3): qty 1

## 2021-08-01 MED ORDER — ALBUTEROL SULFATE (2.5 MG/3ML) 0.083% IN NEBU: 3.0000 mL | INHALATION_SOLUTION | RESPIRATORY_TRACT | Status: DC | PRN

## 2021-08-01 MED ORDER — FUROSEMIDE 10 MG/ML IJ SOLN
40.0000 mg | Freq: Two times a day (BID) | INTRAMUSCULAR | Status: DC
Start: 2021-08-01 — End: 2021-08-02
  Administered 2021-08-01 – 2021-08-02 (×2): 40 mg via INTRAVENOUS
  Filled 2021-08-01 (×2): qty 4

## 2021-08-01 MED ORDER — CARVEDILOL 6.25 MG PO TABS
25.0000 mg | ORAL_TABLET | Freq: Two times a day (BID) | ORAL | Status: DC
  Administered 2021-08-01: 25 mg via ORAL
  Filled 2021-08-01: qty 4

## 2021-08-01 MED ORDER — ENSURE ENLIVE PO LIQD
237.0000 mL | Freq: Two times a day (BID) | ORAL | Status: DC
Start: 2021-08-01 — End: 2021-08-02
  Administered 2021-08-01 (×2): 237 mL via ORAL

## 2021-08-01 MED ORDER — ENOXAPARIN SODIUM 30 MG/0.3ML IJ SOSY
30.0000 mg | PREFILLED_SYRINGE | INTRAMUSCULAR | Status: DC
Start: 2021-08-01 — End: 2021-08-04
  Administered 2021-08-01 – 2021-08-04 (×4): 30 mg via SUBCUTANEOUS
  Filled 2021-08-01 (×4): qty 0.3

## 2021-08-01 MED ORDER — FUROSEMIDE 10 MG/ML IJ SOLN
40.0000 mg | Freq: Once | INTRAMUSCULAR | Status: AC
  Administered 2021-08-01: 40 mg via INTRAVENOUS
  Filled 2021-08-01: qty 4

## 2021-08-01 MED ORDER — PRAVASTATIN SODIUM 20 MG PO TABS
20.0000 mg | ORAL_TABLET | Freq: Every day | ORAL | Status: DC
  Administered 2021-08-01 – 2021-08-04 (×3): 20 mg via ORAL
  Filled 2021-08-01 (×4): qty 1

## 2021-08-01 MED ORDER — DIPHENHYDRAMINE HCL 50 MG/ML IJ SOLN: 12.5000 mg | Freq: Three times a day (TID) | INTRAMUSCULAR | Status: DC | PRN

## 2021-08-01 MED ORDER — HYDRALAZINE HCL 50 MG PO TABS
50.0000 mg | ORAL_TABLET | Freq: Three times a day (TID) | ORAL | Status: DC
  Administered 2021-08-01 (×2): 50 mg via ORAL
  Filled 2021-08-01 (×2): qty 1

## 2021-08-01 MED ORDER — SPIRONOLACTONE 25 MG PO TABS
12.5000 mg | ORAL_TABLET | Freq: Every day | ORAL | Status: DC
Start: 2021-08-01 — End: 2021-08-01
  Filled 2021-08-01: qty 0.5

## 2021-08-01 MED ORDER — ASPIRIN 81 MG PO TBEC
81.0000 mg | DELAYED_RELEASE_TABLET | Freq: Every day | ORAL | Status: DC
  Administered 2021-08-02 – 2021-08-04 (×3): 81 mg via ORAL
  Filled 2021-08-01 (×3): qty 1

## 2021-08-01 NOTE — Assessment & Plan Note (Addendum)
Pravastatin  

## 2021-08-01 NOTE — Assessment & Plan Note (Addendum)
remeron

## 2021-08-01 NOTE — Assessment & Plan Note (Addendum)
2D echo on 07/10/2021 showed EF of 25-30% with grade 1 diastolic dysfunction.  Patient has trace leg edema, positive JVD, BNP> 4500, vascular congestion by chest x-ray, clinically consistent with CHF exacerbation. Minimum urinary output despite getting IV Lasix. Borderline blood pressure. Cardiology was consulted.  -Will admit to tele as inpatient -Lasix 40 mg bid by IV -Daily weights -strict I/O's -Low salt diet -Fluid restriction -Obtain REDs Vest reading -Follow cardiology recommendations. -Might need inotropic support for diuresis, I will let cardiology to decide.

## 2021-08-01 NOTE — Assessment & Plan Note (Addendum)
Myocardial injury due to CHF exacerbation. Troponin level 87 --> 90>>109, denies chest pain A1c of 5.2 -Trend troponin and lipid panel with LDL of 111. -Aspirin -Pravastatin

## 2021-08-01 NOTE — Assessment & Plan Note (Signed)
Body weight 59 kg, BMI 25.40 -Start Ensure -Dietitian consult

## 2021-08-01 NOTE — Assessment & Plan Note (Signed)
Hemoglobin stable 10.2 (9.1 07/13/2021) -Follow-up with CBC

## 2021-08-01 NOTE — ED Notes (Signed)
Pt placed on 2L [place] per EDP York Cerise verbal order.

## 2021-08-01 NOTE — Assessment & Plan Note (Signed)
Baseline creatinine 0.83 on 07/13/2021.  Her creatinine is 1.95>>2.09, BUN 63, likely due to cardiorenal syndrome.  Not much urinary output. -Patient is on Lasix -Avoid using renal toxic medications -Follow-up renal function closely by BMP

## 2021-08-01 NOTE — Assessment & Plan Note (Addendum)
-   IV hydralazine as needed -Patient is on IV Lasix -Continue home oral hydralazine and Coreg. -Holding parameters for placed on p.o. hydralazine for borderline blood pressure

## 2021-08-01 NOTE — ED Provider Notes (Signed)
[facility] Provider Note    Event Date/Time   First MD Initiated Contact with Patient 08/01/21 [phone]     (approximate)   History   Weakness and Shortness of Breath   HPI  Zoe Boyle is a 81 y.o. female with a history of CHF and history of chronic kidney disease.  She presents for evaluation of generalized weakness and shortness of breath that started somewhere between several days and a week ago (she told triage and me different things).  Regardless, she has had gradually worsening overall status and has become out of breath at rest as well as with exertion.  She denies having any pain.  She has had some nausea but no vomiting.  She denies fever, sore throat, and dysuria.  She says she has been compliant with her Lasix but she is feeling worse and spite of that.  She has not contacted her primary care doctor.     Physical Exam   Triage Vital Signs: ED Triage Vitals  Enc Vitals Group     BP 07/31/21 2327 (!) 115/95     Pulse Rate 07/31/21 2327 (!) 118     Resp 07/31/21 2327 18     Temp 07/31/21 2327 (!) 97.5 F (36.4 C)     Temp Source 07/31/21 2327 Axillary     SpO2 07/31/21 2327 98 %     Weight 07/31/21 2344 59 kg (130 lb 1.1 oz)     Height 07/31/21 2344 1.524 m (5')     Head Circumference --      Peak Flow --      Pain Score 07/31/21 2344 0     Pain Loc --      Pain Edu? --      Excl. in GC? --     Most recent vital signs: Vitals:   08/01/21 0443 08/01/21 0604  BP: 135/76 (!) 129/108  Pulse: (!) 52 (!) 129  Resp: 18 (!) 22  Temp:    SpO2: 99% 100%     General: Awake, mild respiratory distress. CV:  Good peripheral perfusion.  Tachycardia.  Regular rhythm.  No peripheral edema. Resp:  Mildly increased respiratory effort with some accessory muscle usage.  She has tachypnea.  Lung sounds are essentially normal with no wheezing, rales, nor rhonchi. Abd:  No distention.  No tenderness to palpation.   ED Results / Procedures / Treatments    Labs (all labs ordered are listed, but only abnormal results are displayed) Labs Reviewed  COMPREHENSIVE METABOLIC PANEL - Abnormal; Notable for the following components:      Result Value   CO2 18 (*)    Glucose, Bld 151 (*)    BUN 43 (*)    Creatinine, Ser 1.95 (*)    Total Protein 8.3 (*)    Total Bilirubin 1.4 (*)    GFR, Estimated 26 (*)    All other components within normal limits  BRAIN NATRIURETIC PEPTIDE - Abnormal; Notable for the following components:   B Natriuretic Peptide >4,500.0 (*)    All other components within normal limits  CBC WITH DIFFERENTIAL/PLATELET - Abnormal; Notable for the following components:   RBC 3.68 (*)    Hemoglobin 10.2 (*)    HCT 31.8 (*)    All other components within normal limits  TROPONIN I (HIGH SENSITIVITY) - Abnormal; Notable for the following components:   Troponin I (High Sensitivity) 89 (*)    All other components within normal limits  TROPONIN I (  HIGH SENSITIVITY) - Abnormal; Notable for the following components:   Troponin I (High Sensitivity) 87 (*)    All other components within normal limits  SARS CORONAVIRUS 2 BY RT PCR  HEMOGLOBIN A1C  LIPID PANEL  TROPONIN I (HIGH SENSITIVITY)     EKG  ED ECG REPORT I, Loleta Rose, the attending physician, personally viewed and interpreted this ECG.  Date: 07/31/2021 EKG Time: 23: 34 Rate: 110 Rhythm: sinus tachycardia QRS Axis: Left axis deviation Intervals: Left bundle branch block ST/T Wave abnormalities: Non-specific ST segment / T-wave changes, but no clear evidence of acute ischemia. Narrative Interpretation: no definitive evidence of acute ischemia; does not meet STEMI criteria.    RADIOLOGY I viewed and interpreted both the patient's initial two-view chest x-ray and her repeat portable 1 view x-ray.  On both sets of images, she has profound cardiomegaly and some "fluffiness", likely consistent with CHF.  The radiologist identifies CHF and severe but stable  cardiomegaly.    PROCEDURES:  Critical Care performed: Yes, see critical care procedure note(s)  .1-3 Lead EKG Interpretation  Performed by: Loleta Rose, MD Authorized by: Loleta Rose, MD     Interpretation: abnormal     ECG rate:  120   ECG rate assessment: tachycardic     Rhythm: sinus tachycardia     Ectopy: none     Conduction: normal   .Critical Care  Performed by: Loleta Rose, MD Authorized by: Loleta Rose, MD   Critical care provider statement:    Critical care time (minutes):  30   Critical care time was exclusive of:  Separately billable procedures and treating other patients   Critical care was necessary to treat or prevent imminent or life-threatening deterioration of the following conditions:  Renal failure and cardiac failure   Critical care was time spent personally by me on the following activities:  Development of treatment plan with patient or surrogate, evaluation of patient's response to treatment, examination of patient, obtaining history from patient or surrogate, ordering and performing treatments and interventions, ordering and review of laboratory studies, ordering and review of radiographic studies, pulse oximetry, re-evaluation of patient's condition and review of old charts    MEDICATIONS ORDERED IN ED: Medications  albuterol (PROVENTIL) (2.5 MG/3ML) 0.083% nebulizer solution 3 mL (has no administration in time range)  dextromethorphan-guaiFENesin (MUCINEX DM) 30-600 MG per 12 hr tablet 1 tablet (has no administration in time range)  acetaminophen (TYLENOL) tablet 650 mg (has no administration in time range)  hydrALAZINE (APRESOLINE) injection 5 mg (has no administration in time range)  diphenhydrAMINE (BENADRYL) injection 12.5 mg (has no administration in time range)  enoxaparin (LOVENOX) injection 30 mg (has no administration in time range)  feeding supplement (ENSURE ENLIVE / ENSURE PLUS) liquid 237 mL (has no administration in time range)   furosemide (LASIX) injection 40 mg (40 mg Intravenous Given 08/01/21 0707)     IMPRESSION / MDM / ASSESSMENT AND PLAN / ED COURSE  I reviewed the triage vital signs and the nursing notes.                              Differential diagnosis includes, but is not limited to, acute CHF exacerbation, pneumonia, PE, ACS.  Patient's presentation is most consistent with acute presentation with potential threat to life or bodily function.  Patient's vital signs are notable for tachycardia and tachypnea.  She is saturating 100% but still feels short of breath so I  ordered 2 L of oxygen by nasal cannula for comfort.  EKG shows no STEMI or specific sign of ischemia.  As document above, initial chest x-ray is are concerning for CHF.  I ordered a repeat one-view portable chest x-ray since 7 hours the past and I want to evaluate for interval changes.  Labs ordered include CBC with differential, CMP, COVID PCR, BNP, high-sensitivity troponin x2.  Lab results are notable for a BNP greater than 4500.  She also has acute kidney injury in the setting of chronic kidney disease with a creatinine of 1.95 giving her a GFR of 26.  This is somewhat worrisome given the need to diurese her.  I looked through her record and identified that she takes 40 mill equivalents of furosemide twice daily.  I ordered furosemide 40 mg IV.  I shared with her the evaluation that suggest she needs admission because of volume overload and CHF exacerbation, and she agrees.  She has no infectious signs or symptoms nor chest pain.  The patient is on the cardiac monitor to evaluate for evidence of arrhythmia and/or significant heart rate changes.  Clinical Course as of 08/01/21 0843  Tue Aug 01, 2021  [id number] Discussed case by phone with Dr. Arville Care with the hospitalist service and he will admit. [CF]    Clinical Course User Index [CF] Loleta Rose, MD     FINAL CLINICAL IMPRESSION(S) / ED DIAGNOSES   Final diagnoses:  Acute kidney  injury (HCC)  Elevated troponin level  Acute on chronic congestive heart failure, unspecified heart failure type [facility])     Rx / DC Orders   ED Discharge Orders     None        Note:  This document was prepared using Dragon voice recognition software and may include unintentional dictation errors.   Loleta Rose, MD 08/01/21 [phone]

## 2021-08-01 NOTE — H&P (Addendum)
History and Physical    Zoe Boyle URK:[id number] DOB: [birth year] DOA: 08/01/2021  Referring MD/NP/PA:   PCP: [facility]   Patient coming from:  The patient is coming from home.   Chief Complaint: SOB  HPI: Zoe Boyle is a 81 y.o. female with medical history significant of CHF with EF of 25 to 30%, hypertension, hyperlipidemia, CKD-3A, anemia, who presents with shortness of breath.  Patient was recently hospitalized from 6/11 - 6/15 due to group A streptococcus bacteremia.  Patient was treated with IV antibiotics and discharged on amoxicillin.  Patient finished a course of amoxicillin. Patient states her shortness breath started yesterday, which has been progressively worsening.  Patient has dry cough, no chest pain, fever or chills.  Patient has nausea, but no vomiting, diarrhea or abdominal pain.  No symptoms of UTI.  Data reviewed independently and ED Course: pt was found to have BMP> 4500, troponin level 87, negative COVID PCR, worsening renal function with creatinine 1.95, BUN 43, GFR 26 (recent baseline creatinine 0.83 on 07/13/2021), temperature normal, blood pressure 115/95, heart rate 129--> currently 90-1 100s, RR 20-30s, oxygen saturation 99% on room air.  Chest x-ray showed vascular congestion and cardiomegaly.  Patient is admitted to telemetry bed as inpatient.  EKG: I have personally reviewed.  Sinus rhythm, QTc 522, LAD, poor R wave progression, old left bundle blockage   Review of Systems:   General: no fevers, chills, no body weight gain, has poor appetite, has fatigue HEENT: no blurry vision, hearing changes or sore throat Respiratory: has dyspnea, coughing, no wheezing CV: no chest pain, no palpitations GI: has nausea, no vomiting, abdominal pain, diarrhea, constipation GU: no dysuria, burning on urination, increased urinary frequency, hematuria  Ext: has trace leg edema Neuro: no unilateral weakness, numbness, or tingling, no vision change or hearing  loss Skin: no rash, no skin tear. MSK: No muscle spasm, no deformity, no limitation of range of movement in spin Heme: No easy bruising.  Travel history: No recent long distant travel.   Allergy:  Allergies  Allergen Reactions   Lisinopril Swelling    TONGUE SWELLED    Past Medical History:  Diagnosis Date   Chronic combined systolic and diastolic CHF (congestive heart failure) (HCC)    CKD (chronic kidney disease), stage III (HCC)    Congestive heart failure (CHF) (HCC)    HLD (hyperlipidemia)    Hypertension    Normocytic anemia     History reviewed. No pertinent surgical history.  Social History:  reports that she has quit smoking. She has never used smokeless tobacco. She reports that she does not currently use alcohol. She reports that she does not currently use drugs.  Family History: I have reviewed with patient about family medical history, but patient states that she does not remember any family medical history in detail. Family History  Family history unknown: Yes     Prior to Admission medications   Medication Sig Start Date End Date Taking? Authorizing Provider  amoxicillin (AMOXIL) 875 MG tablet Take 1 tablet (875 mg total) by mouth 2 (two) times daily. 07/13/21   Marrion Coy, MD  aspirin 81 MG EC tablet Take 81 mg by mouth daily.    [provider]  carvedilol (COREG) 25 MG tablet Take 25 mg by mouth in the morning and at bedtime.    [provider]  furosemide (LASIX) 40 MG tablet Take 40 mg by mouth daily.     [provider]  hydrALAZINE (APRESOLINE) 50  MG tablet Take 50 mg by mouth 3 (three) times daily.    [provider]  pravastatin (PRAVACHOL) 40 MG tablet Take 40 mg by mouth daily. Patient not taking: Reported on 07/10/2021    [provider]  spironolactone (ALDACTONE) 25 MG tablet Take 12.5 mg by mouth daily.    [provider]    Physical Exam: Vitals:   07/31/21 2344 08/01/21 0443 08/01/21  0604 08/01/21 1100  BP:  135/76 (!) 129/108 (!) 124/95  Pulse:  (!) 52 (!) 129 82  Resp:  18 (!) 22 20  Temp:    98.2 F (36.8 C)  TempSrc:    Oral  SpO2:  99% 100% 100%  Weight: 59 kg     Height: 5' (1.524 m)      General: Not in acute distress. Thin body habitus HEENT:       Eyes: PERRL, EOMI, no scleral icterus.       ENT: No discharge from the ears and nose, no pharynx injection, no tonsillar enlargement.        Neck: Positive JVD, no bruit, no mass felt. Heme: No neck lymph node enlargement. Cardiac: S1/S2, RRR, No murmurs, No gallops or rubs. Respiratory: Has fine crackles bilaterally GI: Soft, nondistended, nontender, no rebound pain, no organomegaly, BS present. GU: No hematuria Ext: Has trace edema bilaterally. 1+DP/PT pulse bilaterally. Musculoskeletal: No joint deformities, No joint redness or warmth, no limitation of ROM in spin. Skin: No rashes.  Neuro: Alert, oriented X3, cranial nerves II-XII grossly intact, moves all extremities normally.  Psych: Patient is not psychotic, no suicidal or hemocidal ideation.  Labs on Admission: I have personally reviewed following labs and imaging studies  CBC: Recent Labs  Lab 07/31/21 2341  WBC 5.7  NEUTROABS 2.8  HGB 10.2*  HCT 31.8*  MCV 86.4  PLT 0000000   Basic Metabolic Panel: Recent Labs  Lab 07/31/21 2341  NA 143  K 3.9  CL 110  CO2 18*  GLUCOSE 151*  BUN 43*  CREATININE 1.95*  CALCIUM 9.3   GFR: Estimated Creatinine Clearance: 18.8 mL/min (A) (by C-G formula based on SCr of 1.95 mg/dL (H)). Liver Function Tests: Recent Labs  Lab 07/31/21 2341  AST 28  ALT 17  ALKPHOS 61  BILITOT 1.4*  PROT 8.3*  ALBUMIN 4.2   No results for input(s): "LIPASE", "AMYLASE" in the last 168 hours. No results for input(s): "AMMONIA" in the last 168 hours. Coagulation Profile: No results for input(s): "INR", "PROTIME" in the last 168 hours. Cardiac Enzymes: No results for input(s): "CKTOTAL", "CKMB", "CKMBINDEX",  "TROPONINI" in the last 168 hours. BNP (last 3 results) No results for input(s): "PROBNP" in the last 8760 hours. HbA1C: Recent Labs    08/01/21 0811  HGBA1C 5.2   CBG: No results for input(s): "GLUCAP" in the last 168 hours. Lipid Profile: Recent Labs    08/01/21 0811  CHOL 171  HDL 46  LDLCALC 111*  TRIG 68  CHOLHDL 3.7   Thyroid Function Tests: No results for input(s): "TSH", "T4TOTAL", "FREET4", "T3FREE", "THYROIDAB" in the last 72 hours. Anemia Panel: No results for input(s): "VITAMINB12", "FOLATE", "FERRITIN", "TIBC", "IRON", "RETICCTPCT" in the last 72 hours. Urine analysis:    Component Value Date/Time   COLORURINE AMBER (A) 07/09/2021 2017   APPEARANCEUR CLOUDY (A) 07/09/2021 2017   LABSPEC 1.019 07/09/2021 2017   PHURINE 5.0 07/09/2021 2017   GLUCOSEU NEGATIVE 07/09/2021 2017   HGBUR LARGE (A) 07/09/2021 2017   BILIRUBINUR NEGATIVE 07/09/2021  2017   KETONESUR NEGATIVE 07/09/2021 2017   PROTEINUR 100 (A) 07/09/2021 2017   NITRITE NEGATIVE 07/09/2021 2017   LEUKOCYTESUR NEGATIVE 07/09/2021 2017   Sepsis Labs: @LABRCNTIP (procalcitonin:4,lacticidven:4) ) Recent Results (from the past 240 hour(s))  SARS Coronavirus 2 by RT PCR (hospital order, performed in [facility] hospital lab) *cepheid single result test* Anterior Nasal Swab     Status: None   Collection Time: 08/01/21  6:13 AM   Specimen: Anterior Nasal Swab  Result Value Ref Range Status   SARS Coronavirus 2 by RT PCR NEGATIVE NEGATIVE Final    Comment: (NOTE) SARS-CoV-2 target nucleic acids are NOT DETECTED.  The SARS-CoV-2 RNA is generally detectable in upper and lower respiratory specimens during the acute phase of infection. The lowest concentration of SARS-CoV-2 viral copies this assay can detect is 250 copies / mL. A negative result does not preclude SARS-CoV-2 infection and should not be used as the sole basis for treatment or other patient management decisions.  A negative result may occur  with improper specimen collection / handling, submission of specimen other than nasopharyngeal swab, presence of viral mutation(s) within the areas targeted by this assay, and inadequate number of viral copies (<250 copies / mL). A negative result must be combined with clinical observations, patient history, and epidemiological information.  Fact Sheet for Patients:   10/02/21  Fact Sheet for Healthcare Providers: RoadLapTop.co.za  This test is not yet approved or  cleared by the http://kim-miller.com/ FDA and has been authorized for detection and/or diagnosis of SARS-CoV-2 by FDA under an Emergency Use Authorization (EUA).  This EUA will remain in effect (meaning this test can be used) for the duration of the COVID-19 declaration under Section 564(b)(1) of the Act, 21 U.S.C. section 360bbb-3(b)(1), unless the authorization is terminated or revoked sooner.  Performed at [facility], [address] [place], [place]      Radiological Exams on Admission: DG Chest Portable 1 View  Result Date: 08/01/2021 CLINICAL DATA:  81 year old female with shortness of breath. EXAM: PORTABLE CHEST 1 VIEW COMPARISON:  Chest radiograph 08/20/2021 and earlier. FINDINGS: Portable AP upright view at 0637 hours. Severe cardiomegaly ( CT Abdomen and Pelvis 07/09/2021). Stable cardiac size and mediastinal contours. Slightly lower lung volumes. Stable pulmonary vascular congestion. No pneumothorax, pleural effusion or consolidation. Visualized tracheal air column is within normal limits. No acute osseous abnormality identified. Negative visible bowel gas. IMPRESSION: 1. Severe cardiomegaly with stable pulmonary vascular congestion, mild if any interstitial edema. 2. No new cardiopulmonary abnormality. Electronically Signed   By: 09/08/2021 M.D.   On: 08/01/2021 07:35   DG Chest 2 View  Result Date: 07/31/2021 CLINICAL DATA:  Shortness of breath  EXAM: CHEST - 2 VIEW COMPARISON:  07/09/2021 FINDINGS: Cardiac shadow is enlarged but stable. Lungs are well aerated bilaterally. No focal infiltrate or sizable effusion is seen. No bony abnormality is noted. IMPRESSION: Stable cardiomegaly.  No acute abnormality seen. Electronically Signed   By: 09/08/2021 M.D.   On: 07/31/2021 23:58      Assessment/Plan Principal Problem:   Acute on chronic combined systolic and diastolic congestive heart failure (HCC) Active Problems:   Myocardial injury   Acute renal failure superimposed on stage 3a chronic kidney disease (HCC)   Hypertension   Anemia of chronic disease   Protein-calorie malnutrition, moderate (HCC)   HLD (hyperlipidemia)   Depression   Principal Problem:   Acute on chronic combined systolic and diastolic congestive heart failure (HCC) Active  Problems:   Myocardial injury   Acute renal failure superimposed on stage 3a chronic kidney disease (HCC)   Hypertension   Anemia of chronic disease   Protein-calorie malnutrition, moderate (HCC)   HLD (hyperlipidemia)   Depression   Assessment and Plan: * Acute on chronic combined systolic and diastolic congestive heart failure ([place]) 2D echo on 07/10/2021 showed EF of 25-30% with grade 1 diastolic dysfunction.  Patient has trace leg edema, positive JVD, BNP> 4500, vascular congestion by chest x-ray, clinically consistent with CHF exacerbation.  -Will admit to tele as inpatient -Lasix 40 mg bid by IV -Daily weights -strict I/O's -Low salt diet -Fluid restriction -Obtain REDs Vest reading  Myocardial injury Myocardial injury due to CHF exacerbation. Troponin level 87 --> 90, denies chest pain -Trend troponin -Aspirin -Pravastatin -Check A1c, FLP  Acute renal failure superimposed on stage 3a chronic kidney disease (HCC) Baseline creatinine 0.83 on 07/13/2021.  Her creatinine is 1.95, BUN 43, likely due to cardiorenal syndrome -Patient is on Lasix -Avoid using renal toxic  medications -Follow-up renal function closely by BMP  Hypertension - IV hydralazine as needed -Patient is on IV Lasix -Continue home oral hydralazine and Coreg  Anemia of chronic disease Hemoglobin stable 10.2 (9.1 07/13/2021) -Follow-up with CBC  Protein-calorie malnutrition, moderate (HCC) Body weight 59 kg, BMI 25.40 -Start Ensure  HLD (hyperlipidemia) - Pravastatin  Depression -remeron             DVT ppx: SQ Lovenox  Code Status: Full code  Family Communication:    Yes, patient's daughter  by phone  Disposition Plan:  Anticipate discharge back to previous environment  Consults called:  none  Admission status and Level of care: Telemetry Medical:     as inpt      Severity of Illness:  The appropriate patient status for this patient is INPATIENT. Inpatient status is judged to be reasonable and necessary in order to provide the required intensity of service to ensure the patient's safety. The patient's presenting symptoms, physical exam findings, and initial radiographic and laboratory data in the context of their chronic comorbidities is felt to place them at high risk for further clinical deterioration. Furthermore, it is not anticipated that the patient will be medically stable for discharge from the hospital within 2 midnights of admission.   * I certify that at the point of admission it is my clinical judgment that the patient will require inpatient hospital care spanning beyond 2 midnights from the point of admission due to high intensity of service, high risk for further deterioration and high frequency of surveillance required.*       Date of Service 08/01/2021    Ivor Costa [facility]   If 7PM-7AM, please contact night-coverage [email or web address] 08/01/2021, 12:52 PM

## 2021-08-02 ENCOUNTER — Encounter: Payer: Self-pay | Admitting: Internal Medicine

## 2021-08-02 ENCOUNTER — Inpatient Hospital Stay
Admit: 2021-08-02 | Discharge: 2021-08-02 | Disposition: A | Discharge status: Home / Self Care | Payer: Medicare Other | Attending: Cardiology | Admitting: Cardiology

## 2021-08-02 DIAGNOSIS — Z7189 Other specified counseling: Secondary | ICD-10-CM | POA: Diagnosis not present

## 2021-08-02 DIAGNOSIS — I5043 Acute on chronic combined systolic (congestive) and diastolic (congestive) heart failure: Secondary | ICD-10-CM | POA: Diagnosis not present

## 2021-08-02 LAB — BASIC METABOLIC PANEL
Anion gap: 12 (ref 5–15)
BUN: 63 mg/dL — ABNORMAL HIGH (ref 8–23)
CO2: 22 mmol/L (ref 22–32)
Calcium: 8.7 mg/dL — ABNORMAL LOW (ref 8.9–10.3)
Chloride: 110 mmol/L (ref 98–111)
Creatinine, Ser: 2.09 mg/dL — ABNORMAL HIGH (ref 0.44–1.00)
GFR, Estimated: 24 mL/min — ABNORMAL LOW (ref >60)
Glucose, Bld: 112 mg/dL — ABNORMAL HIGH (ref 70–99)
Potassium: 4.8 mmol/L (ref 3.5–5.1)
Sodium: 144 mmol/L (ref 135–145)

## 2021-08-02 LAB — ECHOCARDIOGRAM LIMITED
Calc EF: 29.2 %
S' Lateral: 5.83 cm
Single Plane A2C EF: 29.8 %
Single Plane A4C EF: 27.3 %

## 2021-08-02 LAB — TROPONIN I (HIGH SENSITIVITY): Troponin I (High Sensitivity): 109 ng/L (ref <18)

## 2021-08-02 MED ORDER — ADULT MULTIVITAMIN W/MINERALS CH
1.0000 | ORAL_TABLET | Freq: Every day | ORAL | Status: DC
  Administered 2021-08-02 – 2021-08-04 (×3): 1 via ORAL
  Filled 2021-08-02 (×3): qty 1

## 2021-08-02 MED ORDER — ISOSORBIDE MONONITRATE ER 30 MG PO TB24
15.0000 mg | ORAL_TABLET | Freq: Every day | ORAL | Status: DC
Start: 2021-08-03 — End: 2021-08-02

## 2021-08-02 MED ORDER — CARVEDILOL 3.125 MG PO TABS
3.1250 mg | ORAL_TABLET | Freq: Two times a day (BID) | ORAL | Status: DC
  Administered 2021-08-02 – 2021-08-04 (×4): 3.125 mg via ORAL
  Filled 2021-08-02 (×4): qty 1

## 2021-08-02 MED ORDER — ENSURE ENLIVE PO LIQD
237.0000 mL | Freq: Three times a day (TID) | ORAL | Status: DC
  Administered 2021-08-02 – 2021-08-04 (×6): 237 mL via ORAL

## 2021-08-02 MED ORDER — HYDRALAZINE HCL 10 MG PO TABS
10.0000 mg | ORAL_TABLET | Freq: Three times a day (TID) | ORAL | Status: DC
  Filled 2021-08-02 (×2): qty 1

## 2021-08-02 MED ORDER — FUROSEMIDE 10 MG/ML IJ SOLN
80.0000 mg | Freq: Once | INTRAMUSCULAR | Status: DC
Start: 2021-08-02 — End: 2021-08-02
  Filled 2021-08-02: qty 8

## 2021-08-02 MED ORDER — FUROSEMIDE 10 MG/ML IJ SOLN
80.0000 mg | Freq: Once | INTRAMUSCULAR | Status: AC
Start: 2021-08-02 — End: 2021-08-02
  Administered 2021-08-02: 80 mg via INTRAVENOUS
  Filled 2021-08-02: qty 8

## 2021-08-02 NOTE — ED Notes (Signed)
Per Nelson Chimes, MD hold 80mg  IV lasix at this time due to patient's BP 96/75 and previously BP dropping into 80's after receiving 40mg  Lasix this AM.

## 2021-08-02 NOTE — Progress Notes (Signed)
*  PRELIMINARY RESULTS* Echocardiogram 2D Echocardiogram has been performed.  Joanette Gula Zeb Rawl 08/02/2021, 12:55 PM

## 2021-08-02 NOTE — Progress Notes (Signed)
PT Cancellation Note  Patient Details Name: Makahla Kiser MRN: [id number] DOB: October 25, 1940   Cancelled Treatment:    Reason Eval/Treat Not Completed: Medical issues which prohibited therapy Pt with up-trending troponins, ongoing cardiac issues.  Per discussion with cardiology this AM in ED hold activity with PT this date; will continue to follow from a distance and attempt PT eval when appropriate.    Malachi Pro, DPT 08/02/2021, 2:44 PM

## 2021-08-02 NOTE — ED Notes (Addendum)
Critical Result: troponin 109  [place], NP aware

## 2021-08-02 NOTE — ED Notes (Signed)
Patient resting comfortably. No needs expressed to RN at this time. Respirations even and unlabored with NAD noted.

## 2021-08-02 NOTE — Consult Note (Addendum)
Consultation Note Date: 08/02/2021   Patient Name: Zoe Boyle  DOB: 1940/03/12  MRN: [id number]  Age / Sex: 81 y.o., female  PCP: [facility] Referring Physician: Lorella Nimrod, MD  Reason for Consultation: Establishing goals of care  HPI/Patient Profile: Zoe Boyle is a 81 y.o. female with medical history significant of CHF with EF of 25 to 30%, hypertension, hyperlipidemia, CKD-3A, anemia, who presents with shortness of breath.   Patient was recently hospitalized from 6/11 - 6/15 due to group A streptococcus bacteremia.  Patient was treated with IV antibiotics and discharged on amoxicillin.  Patient finished a course of amoxicillin. Patient states her shortness breath started yesterday, which has been progressively worsening.  Patient has dry cough, no chest pain, fever or chills.  Patient has nausea, but no vomiting, diarrhea or abdominal pain.  No symptoms of UTI.  Clinical Assessment and Goals of Care: Notes and labs reviewed. In to see patient. No family at bedside. She tells me she is divorced and has 3 children, 2 sons and a daughter. She lives with her daughter.  She tells me that since her bacteremia, she has been more tired and has felt more frail than before. She states at baseline, and even since her recent admission, she does not use any assistive devices. She tells me she does not use O2. She no longer drives, and has not "in a while".  She goes to the grocery store, and pushes the cart in the store. She tells me she is able to help clean around the house.  We discussed her diagnosis, prognosis, GOC, EOL wishes disposition and options.  Created space and opportunity for patient  to explore thoughts and feelings regarding current medical information.   A detailed discussion was had today regarding advanced directives.  Concepts specific to code status, artifical feeding and hydration, IV  antibiotics and rehospitalization were discussed.  The difference between an aggressive medical intervention path and a comfort care path was discussed.  Values and goals of care important to patient and family were attempted to be elicited.  Discussed limitations of medical interventions to prolong quality of life in some situations and discussed the concept of human mortality.  She states at this time, she would want any care indicated and available, without restriction. Will continue to follow.     SUMMARY OF RECOMMENDATIONS   Full code/full scope.  Recommend outpatient palliative care.        Primary Diagnoses: Present on Admission:  Acute renal failure superimposed on stage 3a chronic kidney disease (HCC)  Anemia of chronic disease  Hypertension  Protein-calorie malnutrition, moderate (HCC)  Myocardial injury  HLD (hyperlipidemia)  Acute on chronic combined systolic and diastolic congestive heart failure ([place])  Depression   I have reviewed the medical record, interviewed the patient and family, and examined the patient. The following aspects are pertinent.  Past Medical History:  Diagnosis Date   Chronic combined systolic and diastolic CHF (congestive heart failure) (HCC)    CKD (chronic kidney disease), stage III (  HCC)    Congestive heart failure (CHF) (HCC)    HLD (hyperlipidemia)    Hypertension    Normocytic anemia    Sepsis (HCC) 07/09/2021   Social History   Socioeconomic History   Marital status: Divorced    Spouse name: Not on file   Number of children: Not on file   Years of education: Not on file   Highest education level: Not on file  Occupational History   Not on file  Tobacco Use   Smoking status: Former   Smokeless tobacco: Never  Substance and Sexual Activity   Alcohol use: Not Currently   Drug use: Not Currently   Sexual activity: Not on file  Other Topics Concern   Not on file  Social History Narrative   Not on file   Social  Determinants of Health   Financial Resource Strain: Not on file  Food Insecurity: Not on file  Transportation Needs: Not on file  Physical Activity: Not on file  Stress: Not on file  Social Connections: Not on file   Family History  Family history unknown: Yes   Scheduled Meds:  aspirin EC  81 mg Oral Daily   carvedilol  3.125 mg Oral BID WC   enoxaparin (LOVENOX) injection  30 mg Subcutaneous Q24H   feeding supplement  237 mL Oral BID BM   furosemide  80 mg Intravenous Once   hydrALAZINE  10 mg Oral TID   [START ON 08/03/2021] isosorbide mononitrate  15 mg Oral Daily   mirtazapine  15 mg Oral QHS   pravastatin  20 mg Oral Daily   Continuous Infusions: PRN Meds:.acetaminophen, albuterol, dextromethorphan-guaiFENesin, diphenhydrAMINE, hydrALAZINE Medications Prior to Admission:  Prior to Admission medications   Medication Sig Start Date End Date Taking? Authorizing Provider  aspirin 81 MG EC tablet Take 81 mg by mouth daily.   Yes [provider]  carvedilol (COREG) 25 MG tablet Take 25 mg by mouth in the morning and at bedtime.   Yes [provider]  furosemide (LASIX) 40 MG tablet Take 40 mg by mouth daily.    Yes [provider]  hydrALAZINE (APRESOLINE) 50 MG tablet Take 50 mg by mouth 3 (three) times daily.   Yes [provider]  mirtazapine (REMERON) 15 MG tablet Take 15 mg by mouth at bedtime. 07/21/21  Yes [provider]  pravastatin (PRAVACHOL) 20 MG tablet Take 20 mg by mouth daily. 07/21/21  Yes [provider]  spironolactone (ALDACTONE) 25 MG tablet Take 12.5 mg by mouth daily.   Yes [provider]  amoxicillin (AMOXIL) 875 MG tablet Take 1 tablet (875 mg total) by mouth 2 (two) times daily. Patient not taking: Reported on 08/01/2021 07/13/21   Marrion Coy, MD   Allergies  Allergen Reactions   Lisinopril Swelling    TONGUE SWELLED   Review of Systems  All other systems reviewed and are  negative.   Physical Exam Pulmonary:     Effort: Pulmonary effort is normal.  Skin:    General: Skin is warm and dry.  Neurological:     Mental Status: She is alert.     Vital Signs: BP 104/74 (BP Location: Left Arm)   Pulse 72   Temp 98.1 F (36.7 C)   Resp 18   Ht 5' (1.524 m)   Wt 59 kg   SpO2 100%   BMI 25.40 kg/m  Pain Scale: 0-10   Pain Score: 0-No pain   SpO2: SpO2: 100 % O2  Device:SpO2: 100 % O2 Flow Rate: .O2 Flow Rate (L/min): 2 L/min  IO: Intake/output summary: No intake or output data in the 24 hours ending 08/02/21 1544  LBM: Last BM Date : 08/02/21 Baseline Weight: Weight: 59 kg Most recent weight: Weight: 59 kg      Signed by: Morton Stall, NP   Please contact Palliative Medicine Team phone at [phone] for questions and concerns.  For individual provider: See Loretha Stapler

## 2021-08-02 NOTE — Hospital Course (Addendum)
Taken from H&P.  Zoe Boyle is a 81 y.o. female with medical history significant of CHF with EF of 25 to 30%, hypertension, hyperlipidemia, CKD-3A, anemia, who presents with shortness of breath.   Patient was recently hospitalized from 6/11 - 6/15 due to group A streptococcus bacteremia.  Patient was treated with IV antibiotics and discharged on amoxicillin.  Patient finished a course of amoxicillin. Patient states her shortness breath started yesterday, which has been progressively worsening.  Patient has dry cough, no chest pain, fever or chills.  Patient has nausea, but no vomiting, diarrhea or abdominal pain.  No symptoms of UTI.   Data reviewed independently and ED Course: pt was found to have BMP> 4500, troponin level 87, negative COVID PCR, worsening renal function with creatinine 1.95, BUN 43, GFR 26 (recent baseline creatinine 0.83 on 07/13/2021), temperature normal, blood pressure 115/95, heart rate 129--> currently 90-1 100s, RR 20-30s, oxygen saturation 99% on room air.  Chest x-ray showed vascular congestion and cardiomegaly.  Patient is admitted to telemetry bed as inpatient.   EKG: I have personally reviewed.  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  Admitted for acute on chronic combined systolic and diastolic congestive heart failure, started on IV diuresis.  7/5: Troponin still elevated, currently at 109.  BMP with little worsening of BUN and creatinine at 63/2.09, baseline below 1. Received morning dose of Lasix.   Cardiology was consulted and they would like to continue Lasix at a higher dose due to insufficient urinary output. Blood pressure remained borderline. In general patient is very frail, no other clinical sign of hypervolemia except JVD, also concern of cardiorenal. Might need inotropic support with milrinone for diuresis. Cardiology to decide upon further management of her acute on chronic HFrEF.  On 2 L of oxygen with no baseline  oxygen use.  Very high risk for decompensation.  Palliative care was also consulted.

## 2021-08-02 NOTE — Progress Notes (Signed)
Progress Note   Patient: Zoe Boyle KZS:[id number] DOB: 11/17/1940 DOA: 08/01/2021     1 DOS: the patient was seen and examined on 08/02/2021   Brief hospital course: Taken from H&P.  Zoe Boyle is a 81 y.o. female with medical history significant of CHF with EF of 25 to 30%, hypertension, hyperlipidemia, CKD-3A, anemia, who presents with shortness of breath.   Patient was recently hospitalized from 6/11 - 6/15 due to group A streptococcus bacteremia.  Patient was treated with IV antibiotics and discharged on amoxicillin.  Patient finished a course of amoxicillin. Patient states her shortness breath started yesterday, which has been progressively worsening.  Patient has dry cough, no chest pain, fever or chills.  Patient has nausea, but no vomiting, diarrhea or abdominal pain.  No symptoms of UTI.   Data reviewed independently and ED Course: pt was found to have BMP> 4500, troponin level 87, negative COVID PCR, worsening renal function with creatinine 1.95, BUN 43, GFR 26 (recent baseline creatinine 0.83 on 07/13/2021), temperature normal, blood pressure 115/95, heart rate 129--> currently 90-1 100s, RR 20-30s, oxygen saturation 99% on room air.  Chest x-ray showed vascular congestion and cardiomegaly.  Patient is admitted to telemetry bed as inpatient.   EKG: I have personally reviewed.  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  Admitted for acute on chronic combined systolic and diastolic congestive heart failure, started on IV diuresis.  7/5: Troponin still elevated, currently at 109.  BMP with little worsening of BUN and creatinine at 63/2.09, baseline below 1. Received morning dose of Lasix.   Cardiology was consulted and they would like to continue Lasix at a higher dose due to insufficient urinary output. Blood pressure remained borderline. In general patient is very frail, no other clinical sign of hypervolemia except JVD, also concern of  cardiorenal. Might need inotropic support with milrinone for diuresis. Cardiology to decide upon further management of her acute on chronic HFrEF.  On 2 L of oxygen with no baseline oxygen use.  Very high risk for decompensation.  Palliative care was also consulted.   Assessment and Plan: * Acute on chronic combined systolic and diastolic congestive heart failure (HCC) 2D echo on 07/10/2021 showed EF of 25-30% with grade 1 diastolic dysfunction.  Patient has trace leg edema, positive JVD, BNP> 4500, vascular congestion by chest x-ray, clinically consistent with CHF exacerbation. Minimum urinary output despite getting IV Lasix. Borderline blood pressure. Cardiology was consulted.  -Will admit to tele as inpatient -Lasix 40 mg bid by IV -Daily weights -strict I/O's -Low salt diet -Fluid restriction -Obtain REDs Vest reading -Follow cardiology recommendations. -Might need inotropic support for diuresis, I will let cardiology to decide.  Myocardial injury Myocardial injury due to CHF exacerbation. Troponin level 87 --> 90>>109, denies chest pain A1c of 5.2 -Trend troponin and lipid panel with LDL of 111. -Aspirin -Pravastatin   Acute renal failure superimposed on stage 3a chronic kidney disease (HCC) Baseline creatinine 0.83 on 07/13/2021.  Her creatinine is 1.95>>2.09, BUN 63, likely due to cardiorenal syndrome.  Not much urinary output. -Patient is on Lasix -Avoid using renal toxic medications -Follow-up renal function closely by BMP  Hypertension - IV hydralazine as needed -Patient is on IV Lasix -Continue home oral hydralazine and Coreg. -Holding parameters for placed on p.o. hydralazine for borderline blood pressure  Anemia of chronic disease Hemoglobin stable 10.2 (9.1 07/13/2021) -Follow-up with CBC  Protein-calorie malnutrition, moderate (HCC) Body weight 59 kg, BMI 25.40 -  Start Ensure -Dietitian consult  HLD (hyperlipidemia) -  Pravastatin  Depression -Continue remeron   Subjective: Patient was seen and examined today.  Stating that breathing is better.  Denies any baseline oxygen use.  Physical Exam: Vitals:   08/02/21 1227 08/02/21 1229 08/02/21 1300 08/02/21 1353  BP: 96/75  97/78 95/78  Pulse: 100 (!) 103 78 75  Resp: (!) 35 20 18 (!) 27  Temp:    98.2 F (36.8 C)  TempSrc:    Oral  SpO2: 100% 100% 100% 100%  Weight:      Height:       General.  Frail and malnourished elderly lady, in no acute distress. Pulmonary.  Lungs clear bilaterally, normal respiratory effort. CV.  Regular rate and rhythm, positive JVD and murmur Abdomen.  Soft, nontender, nondistended, BS positive. CNS.  Alert and oriented .  No apparent focal deficit. Extremities.  No edema, no cyanosis, pulses intact and symmetrical. Psychiatry.  Judgment and insight appears normal.  Data Reviewed: Prior data reviewed  Family Communication: Talked to daughter on phone.  Disposition: Status is: Inpatient Remains inpatient appropriate because: Severity of illness   Planned Discharge Destination: Home  Time spent: 55 minutes  This record has been created using Conservation officer, historic buildings. Errors have been sought and corrected,but may not always be located. Such creation errors do not reflect on the standard of care.  Author: Arnetha Courser, MD 08/02/2021 2:26 PM  For on call review [email or web address].

## 2021-08-02 NOTE — Progress Notes (Signed)
Initial Nutrition Assessment  DOCUMENTATION CODES:   Non-severe (moderate) malnutrition in context of chronic illness  INTERVENTION:   -Ensure Enlive po TID, each supplement provides 350 kcal and 20 grams of protein -MVI with minerals daily  NUTRITION DIAGNOSIS:   Moderate Malnutrition related to chronic illness (CHF) as evidenced by mild fat depletion, moderate fat depletion, moderate muscle depletion, severe muscle depletion.  GOAL:   Patient will meet greater than or equal to 90% of their needs  MONITOR:   PO intake, Supplement acceptance  REASON FOR ASSESSMENT:   Consult Assessment of nutrition requirement/status  ASSESSMENT:   Pt with medical history significant of CHF with EF of 25 to 30%, hypertension, hyperlipidemia, CKD-3A, anemia, who presents with shortness of breath.  Pt admitted with CHF.   Spoke with pt at bedside, who reports feeling better today. She shares that she consumed about 50% of her lunch today. She has a poor appetite and reports that she generally grazes PTA. Snacks include foods like bologna, [place], and chicken salad. Pt with multiple missing teeth, but denies that this impacts her chewing or swallowing abilities. Offered to downgrade diet, however, pt politely declined, stating that she would rather choose food items that she likes to eat.   Pt reports that her UBW is around 136#. Pt endorses a 30# wt loss over the past year, which she attributes to being hospitalized with the flu about a year ago. Reviewed wt hx; pt wt has been stable over the past year.   Discussed importance of good meal and supplement intake to promote healing. Pt has tried Ensure supplements in the past and is amenable to drink these.    Per palliative care notes, pt desires full scope care.   Medications reviewed and include lasix and remeron.   Labs reviewed.   NUTRITION - FOCUSED PHYSICAL EXAM:  Flowsheet Row Most Recent Value  Orbital Region Mild depletion   Upper Arm Region Moderate depletion  Thoracic and Lumbar Region Mild depletion  Buccal Region Moderate depletion  Temple Region Moderate depletion  Clavicle Bone Region Severe depletion  Clavicle and Acromion Bone Region Severe depletion  Scapular Bone Region Severe depletion  Dorsal Hand Severe depletion  Patellar Region Moderate depletion  Anterior Thigh Region Moderate depletion  Posterior Calf Region Moderate depletion  Edema (RD Assessment) None  Hair Reviewed  Eyes Reviewed  Mouth Reviewed  Skin Reviewed  Nails Reviewed       Diet Order:   Diet Order             Diet 2 gram sodium Room service appropriate? Yes; Fluid consistency: Thin  Diet effective now                   EDUCATION NEEDS:   Education needs have been addressed  Skin:  Skin Assessment: Reviewed RN Assessment  Last BM:  08/02/21  Height:   Ht Readings from Last 1 Encounters:  07/31/21 5' (1.524 m)    Weight:   Wt Readings from Last 1 Encounters:  07/31/21 59 kg    Ideal Body Weight:  45.5 kg  BMI:  Body mass index is 25.4 kg/m.  Estimated Nutritional Needs:   Kcal:  1750-1950  Protein:  90-105 grams  Fluid:  > 1.7 L    Levada Schilling, RD, LDN, CDCES Registered Dietitian II Certified Diabetes Care and Education Specialist Please refer to [facility] for RD and/or RD on-call/weekend/after hours pager

## 2021-08-02 NOTE — Progress Notes (Signed)
OT Cancellation Note  Patient Details Name: Sophiamarie Nease MRN: [id number] DOB: September 19, 1940   Cancelled Treatment:    Reason Eval/Treat Not Completed: Patient not medically ready;Other (comment) (pt with ongoing cardiology issues and uptrending tropnin. Will hold therapy on this date, re-attempt as approrpiate.)  Oleta Mouse, OTD OTR/L  08/02/21, 12:59 PM

## 2021-08-02 NOTE — Consult Note (Addendum)
[facility] NOTE       Patient ID: Zoe Boyle MRN: [id number] DOB/AGE: 79-May-1942 79 y.o.  Admit date: 08/01/2021 Referring Physician Dr. Lorella Nimrod Primary Physician Dr. Tressia Miners  Primary Cardiologist Dr. 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  Cardiology is consulted for assistance with her heart failure.  She was recently admitted to [facility] from 6/11 to 6/15 with a fever of 103.6 and a several day history of generalized weakness with urinary and fecal incontinence.  She was found to have group A strep septicemia likely from strep throat.  She was treated with broad-spectrum IV antibiotics and transition to oral amoxicillin.  She also had a slight AKI but was discharged with a normal creatinine and GFR of 0.83 and greater than 60 (worst during admission was creatinine of 1.74, GFR 29).  She presents today without family at bedside and is not a great historian. She tells me she started feeling short of breath 2 days ago but denies any worsening swelling in her belly or legs.  She has been compliant with her Lasix and also says she has been drinking lots of water and juice lately because those are the only things that sound good.  Her appetite has otherwise been poor for some time now.  She denies chest pain, palpitations, dizziness, lower extremity swelling.  No cough, fever, sore throat.  She says she currently "feels fine" but did not feel like eating breakfast.  Lives with her daughter.  She did have a Lexiscan Myoview in 2021 that showed 40% LVEF without evidence of significant defects on perfusion scan, for which medical therapy was recommended. did not follow-up regarding referral for possible defibrillator  after her visit with Dr. Clayborn Bigness in August of last year.  Vitals are notable for blood pressure low this morning of 80/60, slightly improving to 96/67, saturating well on 2 L by nasal cannula.  On telemetry she is in and out of a sinus arrhythmia (tachy/brady) with occasional nonconducted beats with a known LBBB.   Labs on admission are notable for BNP of greater than 4500, BUN/creatinine 43/1.95, GFR 26, worsening overnight slightly with diuresis at 2.09 and 24.  High-sensitivity troponin slightly elevated but flat trending at [phone].  LDL cholesterol 111, TC 171, HDL 46.  No leukocytosis with WBCs 5.7, H&H 10.2/31.8, platelets 176.  Chest x-ray shows severe cardiomegaly with stable pulmonary vascular congestion compared to yesterday.  Review of systems complete and found to be negative unless listed above     Past Medical History:  Diagnosis Date   Chronic combined systolic and diastolic CHF (congestive heart failure) (HCC)    CKD (chronic kidney disease), stage III (HCC)    Congestive heart failure (CHF) (HCC)    HLD (hyperlipidemia)    Hypertension    Normocytic anemia     History reviewed. No pertinent surgical history.  (Not in a hospital admission)  Social History   Socioeconomic History   Marital status: Divorced    Spouse name: Not on file   Number of children: Not on file   Years of education: Not on file   Highest education level: Not on file  Occupational History   Not on file  Tobacco Use   Smoking status:  Former   Smokeless tobacco: Never  Substance and Sexual Activity   Alcohol use: Not Currently   Drug use: Not Currently   Sexual activity: Not on file  Other Topics Concern   Not on file  Social History Narrative   Not on file   Social Determinants of Health   Financial Resource Strain: Not on file  Food Insecurity: Not on file  Transportation Needs: Not on file  Physical Activity: Not on file  Stress: Not on file  Social Connections: Not  on file  Intimate Partner Violence: Not on file    Family History  Family history unknown: Yes      PHYSICAL EXAM General: Elderly and ill-appearing black female,  in no acute distress.  Initially laying on her left side with her eyes closed upon my entry into the room. HEENT:  Normocephalic and atraumatic. Neck:  No JVD.  Lungs: Normal respiratory effort with slightly increased RR on 2 L by [place], poor air movement in the bases with trace crackles.  Heart: HRRR . Normal S1 and S2 without gallops or murmurs. Radial & DP pulses 2+ bilaterally. Abdomen: Non-distended appearing.  Msk: Normal strength and tone for age. Extremities: Warm and well perfused. No clubbing, cyanosis. No peripheral edema.  Neuro: Alert and oriented X 3. Psych:  Answers questions appropriately  Labs:   Lab Results  Component Value Date   WBC 5.7 07/31/2021   HGB 10.2 (L) 07/31/2021   HCT 31.8 (L) 07/31/2021   MCV 86.4 07/31/2021   PLT 176 07/31/2021    Recent Labs  Lab 07/31/21 2341 08/02/21 0407  NA 143 144  K 3.9 4.8  CL 110 110  CO2 18* 22  BUN 43* 63*  CREATININE 1.95* 2.09*  CALCIUM 9.3 8.7*  PROT 8.3*  --   BILITOT 1.4*  --   ALKPHOS 61  --   ALT 17  --   AST 28  --   GLUCOSE 151* 112*   No results found for: "CKTOTAL", "CKMB", "CKMBINDEX", "TROPONINI"  Lab Results  Component Value Date   CHOL 171 08/01/2021   Lab Results  Component Value Date   HDL 46 08/01/2021   Lab Results  Component Value Date   LDLCALC 111 (H) 08/01/2021   Lab Results  Component Value Date   TRIG 68 08/01/2021   Lab Results  Component Value Date   CHOLHDL 3.7 08/01/2021   No results found for: "LDLDIRECT"    Radiology: DG Chest Portable 1 View  Result Date: 08/01/2021 CLINICAL DATA:  82 year old female with shortness of breath. EXAM: PORTABLE CHEST 1 VIEW COMPARISON:  Chest radiograph 08/20/2021 and earlier. FINDINGS: Portable AP upright view at 0637 hours. Severe cardiomegaly ( CT Abdomen and  Pelvis 07/09/2021). Stable cardiac size and mediastinal contours. Slightly lower lung volumes. Stable pulmonary vascular congestion. No pneumothorax, pleural effusion or consolidation. Visualized tracheal air column is within normal limits. No acute osseous abnormality identified. Negative visible bowel gas. IMPRESSION: 1. Severe cardiomegaly with stable pulmonary vascular congestion, mild if any interstitial edema. 2. No new cardiopulmonary abnormality. Electronically Signed   By: Genevie Ann M.D.   On: 08/01/2021 07:35   DG Chest 2 View  Result Date: 07/31/2021 CLINICAL DATA:  Shortness of breath EXAM: CHEST - 2 VIEW COMPARISON:  07/09/2021 FINDINGS: Cardiac shadow is enlarged but stable. Lungs are well aerated bilaterally. No focal infiltrate or sizable effusion is seen. No bony abnormality is noted. IMPRESSION: Stable cardiomegaly.  No acute abnormality seen. Electronically Signed  By: Alcide Clever M.D.   On: 07/31/2021 23:58   ECHOCARDIOGRAM COMPLETE  Result Date: 07/10/2021    ECHOCARDIOGRAM REPORT   Patient Name:   Evalynn Duet Date of Exam: 07/10/2021 Medical Rec #:  [id number]  Height:       60.0 in Accession #:    [id number] Weight:       128.7 lb Date of Birth:  12-Aug-1940  BSA:          1.548 m Patient Age:    81 years   BP:           100/71 mmHg Patient Gender: F          HR:           76 bpm. Exam Location:  [facility] Procedure: 2D Echo, Color Doppler and Cardiac Doppler Indications:     I50.9 Congestive heart failure  History:         Patient has prior history of Echocardiogram examinations. CHF;                  Risk Factors:Hypertension and Former Smoker.  Sonographer:     Ceasar Mons Referring Phys:  [id number] Sunnie Nielsen Diagnosing Phys: Alwyn Pea MD IMPRESSIONS  1. Left ventricular ejection fraction, by estimation, is 25 to 30%. The left ventricle has severely decreased function. The left ventricle demonstrates global hypokinesis. The left ventricular internal cavity size was moderately  to severely dilated. There is mild left ventricular hypertrophy. Left ventricular diastolic parameters are consistent with Grade I diastolic dysfunction (impaired relaxation).  2. Right ventricular systolic function is low normal. The right ventricular size is mildly enlarged.  3. Left atrial size was mild to moderately dilated.  4. Right atrial size was mild to moderately dilated.  5. The mitral valve is grossly normal. 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  6. 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  7. The aortic valve is grossly normal. Aortic valve regurgitation is mild. FINDINGS  Left Ventricle: Left ventricular ejection fraction, by estimation, is 25 to 30%. The left ventricle has severely decreased function. The left ventricle demonstrates global hypokinesis. The left ventricular internal cavity size was moderately to severely  dilated. There is mild left ventricular hypertrophy. Abnormal (paradoxical) septal motion, consistent with left bundle branch block. Left ventricular diastolic parameters are consistent with Grade I diastolic dysfunction (impaired relaxation). Right Ventricle: The right ventricular size is mildly enlarged. No increase in right ventricular wall thickness. Right ventricular systolic function is low normal. Left Atrium: Left atrial size was mild to moderately dilated. Right Atrium: Right atrial size was mild to moderately dilated. Pericardium: There is no evidence of pericardial effusion. Mitral Valve: The mitral valve is grossly normal. 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 MV peak gradient, 5.2 mmHg. The mean mitral valve gradient is 3.0 mmHg. Tricuspid Valve: The tricuspid valve is grossly normal. 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 Aortic Valve: The aortic valve is grossly normal. Aortic valve regurgitation is mild. Aortic valve mean gradient measures 12.0 mmHg. Aortic valve peak gradient measures 23.0 mmHg. Aortic valve area, by VTI  measures 1.33 cm. Pulmonic Valve: The pulmonic valve was normal in structure. Pulmonic valve regurgitation is trivial. Aorta: The ascending aorta was not well visualized. IAS/Shunts: No atrial level shunt detected by color flow Doppler.  LEFT VENTRICLE PLAX 2D LVIDd:         5.67 cm      Diastology LVIDs:  4.88 cm      LV e' medial:    4.38 cm/s LV PW:         1.51 cm      LV E/e' medial:  25.6 LV IVS:        1.44 cm      LV e' lateral:   11.60 cm/s LVOT diam:     1.70 cm      LV E/e' lateral: 9.7 LV SV:         48 LV SV Index:   31 LVOT Area:     2.27 cm  LV Volumes (MOD) LV vol d, MOD A2C: 202.0 ml LV vol d, MOD A4C: 161.0 ml LV vol s, MOD A2C: 132.0 ml LV vol s, MOD A4C: 113.0 ml LV SV MOD A2C:     70.0 ml LV SV MOD A4C:     161.0 ml LV SV MOD BP:      54.3 ml RIGHT VENTRICLE RV Basal diam:  4.46 cm RV S prime:     11.70 cm/s LEFT ATRIUM              Index         RIGHT ATRIUM           Index LA diam:        5.40 cm  3.49 cm/m    RA Area:     28.40 cm LA Vol (A2C):   180.0 ml 116.30 ml/m  RA Volume:   88.90 ml  57.44 ml/m LA Vol (A4C):   163.0 ml 105.32 ml/m LA Biplane Vol: 173.0 ml 111.78 ml/m  AORTIC VALVE                     PULMONIC VALVE AV Area (Vmax):    1.22 cm      PV Vmax:          0.84 m/s AV Area (Vmean):   1.31 cm      PV Vmean:         58.600 cm/s AV Area (VTI):     1.33 cm      PV VTI:           0.125 m AV Vmax:           240.00 cm/s   PV Peak grad:     2.8 mmHg AV Vmean:          158.000 cm/s  PV Mean grad:     2.0 mmHg AV VTI:            0.362 m       PR End Diast Vel: 6.05 msec AV Peak Grad:      23.0 mmHg AV Mean Grad:      12.0 mmHg LVOT Vmax:         129.00 cm/s LVOT Vmean:        91.400 cm/s LVOT VTI:          0.212 m LVOT/AV VTI ratio: 0.59  AORTA Ao Root diam: 2.90 cm MITRAL VALVE                TRICUSPID VALVE MV Area (PHT): 3.24 cm     TR Peak grad:   23.0 mmHg MV Area VTI:   0.91 cm     TR Vmax:        240.00 cm/s MV Peak grad:  5.2 mmHg MV Mean grad:  3.0 mmHg      SHUNTS MV  Vmax:       1.14 m/s     Systemic VTI:  0.21 m MV Vmean:      83.3 cm/s    Systemic Diam: 1.70 cm MV Decel Time: 234 msec MV E velocity: 112.00 cm/s MV A velocity: 123.00 cm/s MV E/A ratio:  0.91 Dwayne D Callwood MD Electronically signed by Yolonda Kida MD Signature Date/Time: 07/10/2021/5:36:48 PM    Final    CT ABDOMEN PELVIS WO CONTRAST  Result Date: 07/09/2021 CLINICAL DATA:  Diarrhea dairrhea, fever, sepsis of unknown origin EXAM: CT ABDOMEN AND PELVIS WITHOUT CONTRAST TECHNIQUE: Multidetector CT imaging of the abdomen and pelvis was performed following the standard protocol without IV contrast. RADIATION DOSE REDUCTION: This exam was performed according to the departmental dose-optimization program which includes automated exposure control, adjustment of the mA and/or kV according to patient size and/or use of iterative reconstruction technique. COMPARISON:  None Available. FINDINGS: Lower chest: Trace bilateral pleural effusions. Marked cardiomegaly. Small pericardial effusion. Hypoattenuation of the cardiac blood pool is in keeping with at least moderate anemia. Hepatobiliary: Cholelithiasis without pericholecystic inflammatory change. Liver unremarkable. No definite intra or extrahepatic biliary ductal dilation on this noncontrast examination. Pancreas: Unremarkable Spleen: Unremarkable Adrenals/Urinary Tract: Adrenal glands are unremarkable. Kidneys are normal, without renal calculi, focal lesion, or hydronephrosis. Bladder is unremarkable. Stomach/Bowel: The stomach, small bowel, and large bowel are unremarkable on this noncontrast examination. The appendix is not clearly identified, however, no secondary signs of appendicitis are seen within the right lower quadrant of the abdomen. Trace ascites. No free intraperitoneal gas. Vascular/Lymphatic: Aortic atherosclerosis. No enlarged abdominal or pelvic lymph nodes. 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 Pelvic organs are otherwise unremarkable. Other: No abdominal wall hernia. Musculoskeletal: No acute bone abnormality. Degenerative changes are seen within the lumbar spine. IMPRESSION: No acute intra-abdominal pathology identified. No definite radiographic explanation for the patient's reported symptoms. Marked cardiomegaly. Moderate anemia. Small pericardial effusion, trace effusions, and trace ascites may reflect changes of cardiogenic failure and/or anasarca. Cholelithiasis Aortic Atherosclerosis (ICD10-I70.0). Electronically Signed   By: Fidela Salisbury M.D.   On: 07/09/2021 22:18   DG Chest Port 1 View  Result Date: 07/09/2021 CLINICAL DATA:  Question sepsis EXAM: PORTABLE CHEST 1 VIEW COMPARISON:  07/29/2020 FINDINGS: Cardiac silhouette is markedly enlarged. No change from comparison exam. Central venous congestion is mild. No pleural fluid. No pneumothorax. IMPRESSION: Marked cardiomegaly not changed from comparison exam. No acute findings. Electronically Signed   By: Suzy Bouchard M.D.   On: 07/09/2021 20:59    ECHO 07/10/2021  1. Left ventricular ejection fraction, by estimation, is 25 to 30%. The  left ventricle has severely decreased function. The left ventricle  demonstrates global hypokinesis. The left ventricular internal cavity size  was moderately to severely dilated.  There is mild left ventricular hypertrophy. Left ventricular diastolic  parameters are consistent with Grade I diastolic dysfunction (impaired  relaxation).   2. Right ventricular systolic function is low normal. The right  ventricular size is mildly enlarged.   3. Left atrial size was mild to moderately dilated.   4. Right atrial size was mild to moderately dilated.   5. The mitral valve is grossly normal. Moderate to severe mitral valve  regurgitation.   6. 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   7. The aortic valve is grossly normal. Aortic valve regurgitation is  mild.   TELEMETRY  reviewed by me: sinus arrhythmia with periods of sinus tach into the low  100s, breaking into sinus rhythm in the 70s with occasional PVCs and dropped sinus beats.   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  Cardiology is consulted for assistance with her heart failure.  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 Her CXR shows vascular congestion, BNP is markedly elevated at >4500 on admission, also requiring 2L of oxygen.  Her weight has increased 10 pounds since her most recent visit with her PCP on 6/22.  Currently 130 pounds. Lexiscan Myoview in 2021 that showed 40% LVEF without evidence of significant defects on perfusion scan, for which medical therapy was recommended. -s/p 40mg  IV lasix x3 doses with minimal UOP - about ~239mL per nursing report.  We will dose 80 mg IV Lasix x1 and monitor for response before potentially starting her on infusion -We will decrease her home GDMT to Coreg 3.125 mg twice daily and hydralazine 10 mg 3 times daily, Imdur 15mg  daily.  Hold spironolactone for current renal function but plan to add back pending clinical course. (Home doses are Coreg 25 twice daily, hydralazine 50mg  3 times daily, imdur 30mg , spironolactone 12.5 once daily) -defer ACEi/ARB with history of tongue swelling with lisinopril  -Repeat echocardiogram complete -Strict I's/O, daily weights, salt and fluid restriction  #AKI Was discharged on 6/15 with normal renal function with Cr. 0.8 and GFR >60 On admission Cr  worsening 1.95-2.09 and GFR 26-24 -monitor closely   #Hypertension #Hyperlipidemia Continue aspirin 81 mg daily and pravastatin 40 mg  This patient's plan of care was discussed and created with Dr.  and he is in agreement.  Signed: , PA-C 08/02/2021, 9:46 AM [facility]

## 2021-08-03 DIAGNOSIS — I5043 Acute on chronic combined systolic (congestive) and diastolic (congestive) heart failure: Secondary | ICD-10-CM | POA: Diagnosis not present

## 2021-08-03 LAB — CBC
HCT: 29.4 % — ABNORMAL LOW (ref 36.0–46.0)
Hemoglobin: 9.5 g/dL — ABNORMAL LOW (ref 12.0–15.0)
MCH: 27.5 pg (ref 26.0–34.0)
MCHC: 32.3 g/dL (ref 30.0–36.0)
MCV: 85.2 fL (ref 80.0–100.0)
Platelets: 144 10*3/uL — ABNORMAL LOW (ref 150–400)
RBC: 3.45 MIL/uL — ABNORMAL LOW (ref 3.87–5.11)
RDW: 14.6 % (ref 11.5–15.5)
WBC: 5.3 10*3/uL (ref 4.0–10.5)
nRBC: 0.8 % — ABNORMAL HIGH (ref 0.0–0.2)

## 2021-08-03 LAB — BASIC METABOLIC PANEL
Anion gap: 10 (ref 5–15)
Anion gap: 10 (ref 5–15)
BUN: 66 mg/dL — ABNORMAL HIGH (ref 8–23)
BUN: 62 mg/dL — ABNORMAL HIGH (ref 8–23)
CO2: 25 mmol/L (ref 22–32)
CO2: 25 mmol/L (ref 22–32)
Calcium: 9.1 mg/dL (ref 8.9–10.3)
Calcium: 9.2 mg/dL (ref 8.9–10.3)
Chloride: 108 mmol/L (ref 98–111)
Chloride: 106 mmol/L (ref 98–111)
Creatinine, Ser: 1.7 mg/dL — ABNORMAL HIGH (ref 0.44–1.00)
Creatinine, Ser: 1.73 mg/dL — ABNORMAL HIGH (ref 0.44–1.00)
GFR, Estimated: 30 mL/min — ABNORMAL LOW (ref >60)
GFR, Estimated: 30 mL/min — ABNORMAL LOW (ref >60)
Glucose, Bld: 124 mg/dL — ABNORMAL HIGH (ref 70–99)
Glucose, Bld: 132 mg/dL — ABNORMAL HIGH (ref 70–99)
Potassium: 3.6 mmol/L (ref 3.5–5.1)
Potassium: 4.1 mmol/L (ref 3.5–5.1)
Sodium: 143 mmol/L (ref 135–145)
Sodium: 141 mmol/L (ref 135–145)

## 2021-08-03 LAB — MAGNESIUM: Magnesium: 2.4 mg/dL (ref 1.7–2.4)

## 2021-08-03 MED ORDER — FUROSEMIDE 10 MG/ML IJ SOLN
80.0000 mg | Freq: Once | INTRAMUSCULAR | Status: AC
  Administered 2021-08-03: 80 mg via INTRAVENOUS
  Filled 2021-08-03: qty 8

## 2021-08-03 MED ORDER — POTASSIUM CHLORIDE CRYS ER 20 MEQ PO TBCR
40.0000 meq | EXTENDED_RELEASE_TABLET | Freq: Two times a day (BID) | ORAL | Status: AC
Start: 2021-08-03 — End: 2021-08-03
  Administered 2021-08-03 (×2): 40 meq via ORAL
  Filled 2021-08-03 (×2): qty 2

## 2021-08-03 NOTE — Progress Notes (Signed)
[facility] CLINIC CARDIOLOGY CONSULT NOTE       Patient ID: Zoe Boyle MRN: [id number] DOB/AGE: 1941-01-08 81 y.o.  Admit date: 08/01/2021 Referring Physician Dr. Arnetha Courser Primary Physician Dr. Nemiah Commander  Primary Cardiologist Dr. Juliann Pares Reason for Consultation acute on chronic HFrEF  HPI: Zoe Boyle is a 81yoF with a PMH of HFrEF (LVEF 25-30% with global hypo, g1DD, mod-severe MR), hypertension hyperlipidemia who presented to [facility] ED 08/01/2021 with generalized weakness and shortness of breath over the past 3 days to a week.  Cardiology is consulted for assistance with her heart failure.  Interval History: -made of urine after 80mg  lasix dosed last night -BP and renal function improved -feels better today, more energy and better appetite. Sitting up and eating breakfast   Review of systems complete and found to be negative unless listed above     Past Medical History:  Diagnosis Date   Chronic combined systolic and diastolic CHF (congestive heart failure) (HCC)    CKD (chronic kidney disease), stage III (HCC)    Congestive heart failure (CHF) (HCC)    HLD (hyperlipidemia)    Hypertension    Normocytic anemia    Sepsis (HCC) 07/09/2021    History reviewed. No pertinent surgical history.  Medications Prior to Admission  Medication Sig Dispense Refill Last Dose   aspirin 81 MG EC tablet Take 81 mg by mouth daily.   07/31/2021   carvedilol (COREG) 25 MG tablet Take 25 mg by mouth in the morning and at bedtime.   07/31/2021   furosemide (LASIX) 40 MG tablet Take 40 mg by mouth daily.    07/31/2021   hydrALAZINE (APRESOLINE) 50 MG tablet Take 50 mg by mouth 3 (three) times daily.   07/31/2021   mirtazapine (REMERON) 15 MG tablet Take 15 mg by mouth at bedtime.   07/31/2021   pravastatin (PRAVACHOL) 20 MG tablet Take 20 mg by mouth daily.   07/31/2021   spironolactone (ALDACTONE) 25 MG tablet Take 12.5 mg by mouth daily.   07/31/2021   amoxicillin (AMOXIL) 875 MG tablet Take 1 tablet  (875 mg total) by mouth 2 (two) times daily. (Patient not taking: Reported on 08/01/2021) 10 tablet 0 Completed Course    Social History   Socioeconomic History   Marital status: Divorced    Spouse name: Not on file   Number of children: Not on file   Years of education: Not on file   Highest education level: Not on file  Occupational History   Not on file  Tobacco Use   Smoking status: Former   Smokeless tobacco: Never  Substance and Sexual Activity   Alcohol use: Not Currently   Drug use: Not Currently   Sexual activity: Not on file  Other Topics Concern   Not on file  Social History Narrative   Not on file   Social Determinants of Health   Financial Resource Strain: Not on file  Food Insecurity: Not on file  Transportation Needs: Not on file  Physical Activity: Not on file  Stress: Not on file  Social Connections: Not on file  Intimate Partner Violence: Not on file    Family History  Family history unknown: Yes      PHYSICAL EXAM General: Elderly and frail appearing black female,  in no acute distress.  Sitting upright in PCU bed eating breakfast HEENT:  Normocephalic and atraumatic. Neck:  No JVD.  Lungs: Normal respiratory effort 2 L by [place], poor air movement in the bases with trace  crackles.  Heart: HRRR . Normal S1 and S2 without gallops or murmurs. Radial & DP pulses 2+ bilaterally. Abdomen: Non-distended appearing.  Msk: Normal strength and tone for age. Extremities: Warm and well perfused. No clubbing, cyanosis. No peripheral edema.  Neuro: Alert and oriented X 3. Psych:  Answers questions appropriately  Labs:   Lab Results  Component Value Date   WBC 5.3 08/03/2021   HGB 9.5 (L) 08/03/2021   HCT 29.4 (L) 08/03/2021   MCV 85.2 08/03/2021   PLT 144 (L) 08/03/2021    Recent Labs  Lab 07/31/21 2341 08/02/21 0407 08/03/21 0512  NA 143   < > 143  K 3.9   < > 3.6  CL 110   < > 108  CO2 18*   < > 25  BUN 43*   < > 66*  CREATININE 1.95*   < >  1.70*  CALCIUM 9.3   < > 9.1  PROT 8.3*  --   --   BILITOT 1.4*  --   --   ALKPHOS 61  --   --   ALT 17  --   --   AST 28  --   --   GLUCOSE 151*   < > 124*   < > = values in this interval not displayed.    No results found for: "CKTOTAL", "CKMB", "CKMBINDEX", "TROPONINI"  Lab Results  Component Value Date   CHOL 171 08/01/2021   Lab Results  Component Value Date   HDL 46 08/01/2021   Lab Results  Component Value Date   LDLCALC 111 (H) 08/01/2021   Lab Results  Component Value Date   TRIG 68 08/01/2021   Lab Results  Component Value Date   CHOLHDL 3.7 08/01/2021   No results found for: "LDLDIRECT"    Radiology: ECHOCARDIOGRAM LIMITED  Result Date: 08/02/2021    ECHOCARDIOGRAM LIMITED REPORT   Patient Name:   Zoe Boyle Date of Exam: 08/02/2021 Medical Rec #:  [id number]  Height:       60.0 in Accession #:    [id number] Weight:       130.1 lb Date of Birth:  August 20, 1940  BSA:          1.555 m Patient Age:    61 years   BP:           88/73 mmHg Patient Gender: F          HR:           103 bpm. Exam Location:  [facility] Procedure: Limited Echo, Limited Color Doppler and Cardiac Doppler Indications:     I50.9 Congestive Heart Failure  History:         Patient has prior history of Echocardiogram examinations, most                  recent 07/10/2021. CHF, CKD; Risk Factors:Hypertension and                  Dyslipidemia.  Sonographer:     Charmayne Sheer Referring Phys:  [id number] Thurmond Butts MATTHEW ORGEL Diagnosing Phys: Donnelly Angelica IMPRESSIONS  1. Left ventricular ejection fraction, by estimation, is 25 to 30%. The left ventricle has severely decreased function. The left ventricle demonstrates global hypokinesis. The left ventricular internal cavity size was moderately to severely dilated.  2. Left atrial size was severely dilated.  3. A small pericardial effusion is present. There is no evidence of cardiac tamponade.  4. The mitral valve is normal in  structure. Severe mitral valve regurgitation.  5. Tricuspid  valve regurgitation is severe.  6. The aortic valve is normal in structure.  7. There is moderately elevated pulmonary artery systolic pressure.  8. The inferior vena cava is dilated in size with <50% respiratory variability, suggesting right atrial pressure of 15 mmHg. Conclusion(s)/Recommendation(s): Limited study. No change in pericardial effusion. Ef remains poor. FINDINGS  Left Ventricle: Left ventricular ejection fraction, by estimation, is 25 to 30%. The left ventricle has severely decreased function. The left ventricle demonstrates global hypokinesis. The left ventricular internal cavity size was moderately to severely  dilated. There is no left ventricular hypertrophy. Right Ventricle: There is moderately elevated pulmonary artery systolic pressure. The tricuspid regurgitant velocity is 3.04 m/s, and with an assumed right atrial pressure of 15 mmHg, the estimated right ventricular systolic pressure is 52.0 mmHg. Left Atrium: Left atrial size was severely dilated. Pericardium: A small pericardial effusion is present. There is no evidence of cardiac tamponade. Mitral Valve: The mitral valve is normal in structure. Severe mitral valve regurgitation, with centrally-directed jet. Tricuspid Valve: The tricuspid valve is normal in structure. Tricuspid valve regurgitation is severe. Aortic Valve: The aortic valve is normal in structure. Pulmonic Valve: The pulmonic valve was normal in structure. Venous: The inferior vena cava is dilated in size with less than 50% respiratory variability, suggesting right atrial pressure of 15 mmHg. IAS/Shunts: No atrial level shunt detected by color flow Doppler. LEFT VENTRICLE PLAX 2D LVIDd:         6.62 cm LVIDs:         5.83 cm LV PW:         1.06 cm LV IVS:        0.74 cm  LV Volumes (MOD) LV vol d, MOD A2C: 191.0 ml LV vol d, MOD A4C: 205.0 ml LV vol s, MOD A2C: 134.0 ml LV vol s, MOD A4C: 149.0 ml LV SV MOD A2C:     57.0 ml LV SV MOD A4C:     205.0 ml LV SV MOD BP:      58.3  ml LEFT ATRIUM         Index LA diam:    5.90 cm 3.79 cm/m  TRICUSPID VALVE TR Peak grad:   37.0 mmHg TR Vmax:        304.00 cm/s Sena Slate Electronically signed by Sena Slate Signature Date/Time: 08/02/2021/1:07:50 PM    Final    DG Chest Portable 1 View  Result Date: 08/01/2021 CLINICAL DATA:  81 year old female with shortness of breath. EXAM: PORTABLE CHEST 1 VIEW COMPARISON:  Chest radiograph 08/20/2021 and earlier. FINDINGS: Portable AP upright view at 0637 hours. Severe cardiomegaly ( CT Abdomen and Pelvis 07/09/2021). Stable cardiac size and mediastinal contours. Slightly lower lung volumes. Stable pulmonary vascular congestion. No pneumothorax, pleural effusion or consolidation. Visualized tracheal air column is within normal limits. No acute osseous abnormality identified. Negative visible bowel gas. IMPRESSION: 1. Severe cardiomegaly with stable pulmonary vascular congestion, mild if any interstitial edema. 2. No new cardiopulmonary abnormality. Electronically Signed   By: Odessa Fleming M.D.   On: 08/01/2021 07:35   DG Chest 2 View  Result Date: 07/31/2021 CLINICAL DATA:  Shortness of breath EXAM: CHEST - 2 VIEW COMPARISON:  07/09/2021 FINDINGS: Cardiac shadow is enlarged but stable. Lungs are well aerated bilaterally. No focal infiltrate or sizable effusion is seen. No bony abnormality is noted. IMPRESSION: Stable cardiomegaly.  No acute abnormality seen. Electronically Signed   By: Alcide Clever  M.D.   On: 07/31/2021 23:58   ECHOCARDIOGRAM COMPLETE  Result Date: 07/10/2021    ECHOCARDIOGRAM REPORT   Patient Name:   Zoe Boyle Date of Exam: 07/10/2021 Medical Rec #:  [id number]  Height:       60.0 in Accession #:    [id number] Weight:       128.7 lb Date of Birth:  06/29/1940  BSA:          1.548 m Patient Age:    37 years   BP:           100/71 mmHg Patient Gender: F          HR:           76 bpm. Exam Location:  [facility] Procedure: 2D Echo, Color Doppler and Cardiac Doppler Indications:     I50.9  Congestive heart failure  History:         Patient has prior history of Echocardiogram examinations. CHF;                  Risk Factors:Hypertension and Former Smoker.  Sonographer:     Rosalia Hammers Referring Phys:  [id number] Emeterio Reeve Diagnosing Phys: Yolonda Kida MD IMPRESSIONS  1. Left ventricular ejection fraction, by estimation, is 25 to 30%. The left ventricle has severely decreased function. The left ventricle demonstrates global hypokinesis. The left ventricular internal cavity size was moderately to severely dilated. There is mild left ventricular hypertrophy. Left ventricular diastolic parameters are consistent with Grade I diastolic dysfunction (impaired relaxation).  2. Right ventricular systolic function is low normal. The right ventricular size is mildly enlarged.  3. Left atrial size was mild to moderately dilated.  4. Right atrial size was mild to moderately dilated.  5. The mitral valve is grossly normal. 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  6. 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  7. The aortic valve is grossly normal. Aortic valve regurgitation is mild. FINDINGS  Left Ventricle: Left ventricular ejection fraction, by estimation, is 25 to 30%. The left ventricle has severely decreased function. The left ventricle demonstrates global hypokinesis. The left ventricular internal cavity size was moderately to severely  dilated. There is mild left ventricular hypertrophy. Abnormal (paradoxical) septal motion, consistent with left bundle branch block. Left ventricular diastolic parameters are consistent with Grade I diastolic dysfunction (impaired relaxation). Right Ventricle: The right ventricular size is mildly enlarged. No increase in right ventricular wall thickness. Right ventricular systolic function is low normal. Left Atrium: Left atrial size was mild to moderately dilated. Right Atrium: Right atrial size was mild to moderately dilated. Pericardium: There is  no evidence of pericardial effusion. Mitral Valve: The mitral valve is grossly normal. 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 MV peak gradient, 5.2 mmHg. The mean mitral valve gradient is 3.0 mmHg. Tricuspid Valve: The tricuspid valve is grossly normal. 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 Aortic Valve: The aortic valve is grossly normal. Aortic valve regurgitation is mild. Aortic valve mean gradient measures 12.0 mmHg. Aortic valve peak gradient measures 23.0 mmHg. Aortic valve area, by VTI measures 1.33 cm. Pulmonic Valve: The pulmonic valve was normal in structure. Pulmonic valve regurgitation is trivial. Aorta: The ascending aorta was not well visualized. IAS/Shunts: No atrial level shunt detected by color flow Doppler.  LEFT VENTRICLE PLAX 2D LVIDd:         5.67 cm      Diastology LVIDs:         4.88 cm  LV e' medial:    4.38 cm/s LV PW:         1.51 cm      LV E/e' medial:  25.6 LV IVS:        1.44 cm      LV e' lateral:   11.60 cm/s LVOT diam:     1.70 cm      LV E/e' lateral: 9.7 LV SV:         48 LV SV Index:   31 LVOT Area:     2.27 cm  LV Volumes (MOD) LV vol d, MOD A2C: 202.0 ml LV vol d, MOD A4C: 161.0 ml LV vol s, MOD A2C: 132.0 ml LV vol s, MOD A4C: 113.0 ml LV SV MOD A2C:     70.0 ml LV SV MOD A4C:     161.0 ml LV SV MOD BP:      54.3 ml RIGHT VENTRICLE RV Basal diam:  4.46 cm RV S prime:     11.70 cm/s LEFT ATRIUM              Index         RIGHT ATRIUM           Index LA diam:        5.40 cm  3.49 cm/m    RA Area:     28.40 cm LA Vol (A2C):   180.0 ml 116.30 ml/m  RA Volume:   88.90 ml  57.44 ml/m LA Vol (A4C):   163.0 ml 105.32 ml/m LA Biplane Vol: 173.0 ml 111.78 ml/m  AORTIC VALVE                     PULMONIC VALVE AV Area (Vmax):    1.22 cm      PV Vmax:          0.84 m/s AV Area (Vmean):   1.31 cm      PV Vmean:         58.600 cm/s AV Area (VTI):     1.33 cm      PV VTI:           0.125 m AV Vmax:           240.00 cm/s   PV Peak grad:     2.8 mmHg AV  Vmean:          158.000 cm/s  PV Mean grad:     2.0 mmHg AV VTI:            0.362 m       PR End Diast Vel: 6.05 msec AV Peak Grad:      23.0 mmHg AV Mean Grad:      12.0 mmHg LVOT Vmax:         129.00 cm/s LVOT Vmean:        91.400 cm/s LVOT VTI:          0.212 m LVOT/AV VTI ratio: 0.59  AORTA Ao Root diam: 2.90 cm MITRAL VALVE                TRICUSPID VALVE MV Area (PHT): 3.24 cm     TR Peak grad:   23.0 mmHg MV Area VTI:   0.91 cm     TR Vmax:        240.00 cm/s MV Peak grad:  5.2 mmHg MV Mean grad:  3.0 mmHg     SHUNTS MV Vmax:  1.14 m/s     Systemic VTI:  0.21 m MV Vmean:      83.3 cm/s    Systemic Diam: 1.70 cm MV Decel Time: 234 msec MV E velocity: 112.00 cm/s MV A velocity: 123.00 cm/s MV E/A ratio:  0.91 Dwayne D Callwood MD Electronically signed by Yolonda Kida MD Signature Date/Time: 07/10/2021/5:36:48 PM    Final    CT ABDOMEN PELVIS WO CONTRAST  Result Date: 07/09/2021 CLINICAL DATA:  Diarrhea dairrhea, fever, sepsis of unknown origin EXAM: CT ABDOMEN AND PELVIS WITHOUT CONTRAST TECHNIQUE: Multidetector CT imaging of the abdomen and pelvis was performed following the standard protocol without IV contrast. RADIATION DOSE REDUCTION: This exam was performed according to the departmental dose-optimization program which includes automated exposure control, adjustment of the mA and/or kV according to patient size and/or use of iterative reconstruction technique. COMPARISON:  None Available. FINDINGS: Lower chest: Trace bilateral pleural effusions. Marked cardiomegaly. Small pericardial effusion. Hypoattenuation of the cardiac blood pool is in keeping with at least moderate anemia. Hepatobiliary: Cholelithiasis without pericholecystic inflammatory change. Liver unremarkable. No definite intra or extrahepatic biliary ductal dilation on this noncontrast examination. Pancreas: Unremarkable Spleen: Unremarkable Adrenals/Urinary Tract: Adrenal glands are unremarkable. Kidneys are normal, without  renal calculi, focal lesion, or hydronephrosis. Bladder is unremarkable. Stomach/Bowel: The stomach, small bowel, and large bowel are unremarkable on this noncontrast examination. The appendix is not clearly identified, however, no secondary signs of appendicitis are seen within the right lower quadrant of the abdomen. Trace ascites. No free intraperitoneal gas. Vascular/Lymphatic: Aortic atherosclerosis. No enlarged abdominal or pelvic lymph nodes. 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 Pelvic organs are otherwise unremarkable. Other: No abdominal wall hernia. Musculoskeletal: No acute bone abnormality. Degenerative changes are seen within the lumbar spine. IMPRESSION: No acute intra-abdominal pathology identified. No definite radiographic explanation for the patient's reported symptoms. Marked cardiomegaly. Moderate anemia. Small pericardial effusion, trace effusions, and trace ascites may reflect changes of cardiogenic failure and/or anasarca. Cholelithiasis Aortic Atherosclerosis (ICD10-I70.0). Electronically Signed   By: Fidela Salisbury M.D.   On: 07/09/2021 22:18   DG Chest Port 1 View  Result Date: 07/09/2021 CLINICAL DATA:  Question sepsis EXAM: PORTABLE CHEST 1 VIEW COMPARISON:  07/29/2020 FINDINGS: Cardiac silhouette is markedly enlarged. No change from comparison exam. Central venous congestion is mild. No pleural fluid. No pneumothorax. IMPRESSION: Marked cardiomegaly not changed from comparison exam. No acute findings. Electronically Signed   By: Suzy Bouchard M.D.   On: 07/09/2021 20:59    ECHO 07/10/2021  1. Left ventricular ejection fraction, by estimation, is 25 to 30%. The  left ventricle has severely decreased function. The left ventricle  demonstrates global hypokinesis. The left ventricular internal cavity size  was moderately to severely dilated.  There is mild left ventricular hypertrophy. Left ventricular diastolic  parameters are  consistent with Grade I diastolic dysfunction (impaired  relaxation).   2. Right ventricular systolic function is low normal. The right  ventricular size is mildly enlarged.   3. Left atrial size was mild to moderately dilated.   4. Right atrial size was mild to moderately dilated.   5. The mitral valve is grossly normal. Moderate to severe mitral valve  regurgitation.   6. 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   7. The aortic valve is grossly normal. Aortic valve regurgitation is  mild.   TELEMETRY reviewed by me: sinus arrhythmia with periods of sinus tach into the low 100s, breaking into sinus rhythm in the  70s with occasional PVCs and dropped sinus beats.   EKG reviewed by me: Sinus tachycardia rate 110, left axis deviation, LBBB  ASSESSMENT AND PLAN:  Khrystyne Henegar is a 9yoF with a PMH of HFrEF (LVEF 25-30% with global hypo, g1DD, mod-severe MR), hypertension hyperlipidemia who presented to [facility] ED 08/01/2021 with generalized weakness and shortness of breath over the past 3 days to a week.  Cardiology is consulted for assistance with her heart failure.  #acute on chronic HFrEF (LVEF 25-30%, g1dd, global hypo, mod-severe MR, small pericardial effusion)  She presents with a couple day history of worsening shortness of breath, generalized weakness, and poor appetite - drinking mostly water and juice per her report. Her CXR shows vascular congestion, BNP is markedly elevated at >4500 on admission, also requiring 2L of oxygen.  Her weight has increased 10 pounds since her most recent visit with her PCP on 6/22.  Currently 130 pounds. Lexiscan Myoview in 2021 that showed 40% LVEF without evidence of significant defects on perfusion scan, for which medical therapy was recommended. -s/p 40mg  IV lasix x3 doses with minimal UOP, s/p 80 mg IV Lasix x1 last night at 10pm with 535mL UOP.  -give another 80mg  IV lasix x1 and monitor response, will likely redose more this afternoon -Continue  GDMT at reduced doses: Coreg 3.125 mg twice daily and hydralazine 10 mg 3 times daily, Imdur 15mg  daily.  Hold spironolactone for current renal function but plan to add back pending clinical course. (Home doses are Coreg 25 twice daily, hydralazine 50mg  3 times daily, imdur 30mg , spironolactone 12.5 once daily) -consider addition of jardiance for GDMT as renal function improves -defer ACEi/ARB with history of tongue swelling with lisinopril  -Repeat echocardiogram complete showed similar poor EF, stable small pericardial effusion -Strict I's/O, daily weights, salt and fluid restriction  #AKI Was discharged on 6/15 with normal renal function with Cr. 0.8 and GFR >60 On admission Cr 1.95-2.09 and GFR 26-24, improving with diuresis overnight with current Cr 1.7 and GFR 30  #Hypertension #Hyperlipidemia Continue aspirin 81 mg daily and pravastatin 40 mg  This patient's plan of care was discussed and created with Dr. Donnelly Angelica and he is in agreement.  Signed: Tristan Schroeder , PA-C 08/03/2021, 8:29 AM [facility]

## 2021-08-03 NOTE — Progress Notes (Signed)
[facility]  - [place] at [facility]   PATIENT NAME: Zoe Boyle    MR#:  [id number]  DATE OF BIRTH:  1940-04-14  SUBJECTIVE:  patient seen earlier. She reports feeling better. Sats hundred percent on 2 L nasal cannula oxygen. No respiratory distress. No family at bedside. Denies any chest pain    VITALS:  Blood pressure 97/84, pulse 100, temperature 97.7 F (36.5 C), resp. rate 19, height 5' (1.524 m), weight 58.2 kg, SpO2 100 %.  PHYSICAL EXAMINATION:   GENERAL:  81 y.o.-year-old patient lying in the bed with no acute distress.  LUNGS decreased breath sounds bilaterally, no wheezing, rales, rhonchi.  CARDIOVASCULAR: S1, S2 normal. No murmurs, rubs, or gallops.  ABDOMEN: Soft, nontender, nondistended. Bowel sounds present.  EXTREMITIES: No  edema b/l.    NEUROLOGIC: nonfocal  patient is alert and awake   LABORATORY PANEL:  CBC Recent Labs  Lab 08/03/21 0512  WBC 5.3  HGB 9.5*  HCT 29.4*  PLT 144*    Chemistries  Recent Labs  Lab 07/31/21 2341 08/02/21 0407 08/03/21 0512  NA 143   < > 143  K 3.9   < > 3.6  CL 110   < > 108  CO2 18*   < > 25  GLUCOSE 151*   < > 124*  BUN 43*   < > 66*  CREATININE 1.95*   < > 1.70*  CALCIUM 9.3   < > 9.1  AST 28  --   --   ALT 17  --   --   ALKPHOS 61  --   --   BILITOT 1.4*  --   --    < > = values in this interval not displayed.   Cardiac Enzymes No results for input(s): "TROPONINI" in the last 168 hours. RADIOLOGY:  ECHOCARDIOGRAM LIMITED  Result Date: 08/02/2021    ECHOCARDIOGRAM LIMITED REPORT   Patient Name:   Zoe Boyle Date of Exam: 08/02/2021 Medical Rec #:  [id number]  Height:       60.0 in Accession #:    [id number] Weight:       130.1 lb Date of Birth:  Jun 04, 1940  BSA:          1.555 m Patient Age:    56 years   BP:           88/73 mmHg Patient Gender: F          HR:           103 bpm. Exam Location:  [facility] Procedure: Limited Echo, Limited Color Doppler and Cardiac Doppler Indications:     I50.9  Congestive Heart Failure  History:         Patient has prior history of Echocardiogram examinations, most                  recent 07/10/2021. CHF, CKD; Risk Factors:Hypertension and                  Dyslipidemia.  Sonographer:     Humphrey Rolls Referring Phys:  [id number] Alycia Rossetti MATTHEW ORGEL Diagnosing Phys: Sena Slate IMPRESSIONS  1. Left ventricular ejection fraction, by estimation, is 25 to 30%. The left ventricle has severely decreased function. The left ventricle demonstrates global hypokinesis. The left ventricular internal cavity size was moderately to severely dilated.  2. Left atrial size was severely dilated.  3. A small pericardial effusion is present. There is no evidence of cardiac tamponade.  4. The mitral valve  is normal in structure. Severe mitral valve regurgitation.  5. Tricuspid valve regurgitation is severe.  6. The aortic valve is normal in structure.  7. There is moderately elevated pulmonary artery systolic pressure.  8. The inferior vena cava is dilated in size with <50% respiratory variability, suggesting right atrial pressure of 15 mmHg. Conclusion(s)/Recommendation(s): Limited study. No change in pericardial effusion. Ef remains poor. FINDINGS  Left Ventricle: Left ventricular ejection fraction, by estimation, is 25 to 30%. The left ventricle has severely decreased function. The left ventricle demonstrates global hypokinesis. The left ventricular internal cavity size was moderately to severely  dilated. There is no left ventricular hypertrophy. Right Ventricle: There is moderately elevated pulmonary artery systolic pressure. The tricuspid regurgitant velocity is 3.04 m/s, and with an assumed right atrial pressure of 15 mmHg, the estimated right ventricular systolic pressure is 52.0 mmHg. Left Atrium: Left atrial size was severely dilated. Pericardium: A small pericardial effusion is present. There is no evidence of cardiac tamponade. Mitral Valve: The mitral valve is normal in structure. Severe  mitral valve regurgitation, with centrally-directed jet. Tricuspid Valve: The tricuspid valve is normal in structure. Tricuspid valve regurgitation is severe. Aortic Valve: The aortic valve is normal in structure. Pulmonic Valve: The pulmonic valve was normal in structure. Venous: The inferior vena cava is dilated in size with less than 50% respiratory variability, suggesting right atrial pressure of 15 mmHg. IAS/Shunts: No atrial level shunt detected by color flow Doppler. LEFT VENTRICLE PLAX 2D LVIDd:         6.62 cm LVIDs:         5.83 cm LV PW:         1.06 cm LV IVS:        0.74 cm  LV Volumes (MOD) LV vol d, MOD A2C: 191.0 ml LV vol d, MOD A4C: 205.0 ml LV vol s, MOD A2C: 134.0 ml LV vol s, MOD A4C: 149.0 ml LV SV MOD A2C:     57.0 ml LV SV MOD A4C:     205.0 ml LV SV MOD BP:      58.3 ml LEFT ATRIUM         Index LA diam:    5.90 cm 3.79 cm/m  TRICUSPID VALVE TR Peak grad:   37.0 mmHg TR Vmax:        304.00 cm/s Sena Slate Electronically signed by Sena Slate Signature Date/Time: 08/02/2021/1:07:50 PM    Final     Assessment and Plan Zoe Boyle is a 81 y.o. female with medical history significant of CHF with EF of 25 to 30%, hypertension, hyperlipidemia, CKD-3A, anemia, who presents with shortness of breath.  Patient was recently hospitalized from 6/11 - 6/15 due to group A streptococcus bacteremia.  Patient was treated with IV antibiotics and discharged on amoxicillin.  Patient finished a course of amoxicillin.  Chest x-ray showed vascular congestion and cardiomegaly  Acute on chronic combined systolic and diastolic congestive heart failure (HCC) --2D echo on 07/10/2021 showed EF of 25-30% with grade 1 diastolic dysfunction.   --Patient has trace leg edema, positive JVD, BNP> 4500, vascular congestion by chest x-ray, clinically consistent with CHF exacerbation. --Minimum urinary output despite getting IV Lasix. --Borderline blood pressure. --Cardiology was consulted--increased dose of lasix--follow  their recs.  -strict I/O's -Low salt diet -Fluid restriction   Elevated troponin suspect demand ischemia --Myocardial injury due to CHF exacerbation. Troponin level 87 --> 90>>109, denies chest pain --A1c of 5.2 -Trend troponin and lipid panel with LDL of  111. -Aspirin and Pravastatin   Acute renal failure superimposed on stage 3a chronic kidney disease ([place]) Baseline creatinine 0.83 on 07/13/2021.  Her creatinine is 1.95>>2.09, BUN 63, likely due to cardiorenal syndrome.   --uop ~600 cc -Patient is on Lasix -Avoid using renal toxic medications -Follow-up renal function closely by BMP --holding spironolactone   Hypertension - IV hydralazine as needed -Patient is on IV Lasix -Continue home oral hydralazine and Coreg.   Anemia of chronic disease Hemoglobin stable 10.2 (9.1 07/13/2021)   Protein-calorie malnutrition, moderate (HCC) Body weight 59 kg, BMI 25.40 -Start Ensure -Dietitian consult   HLD (hyperlipidemia) - Pravastatin   Depression -Continue remeron  Procedures: Family communication :dter Crissie Reese Consults :[place] cards CODE STATUS: FULL DVT Prophylaxis :enoxaparin Level of care: Progressive Status is: Inpatient Remains inpatient appropriate because: ongoing rx of CHF     TOTAL TIME TAKING CARE OF THIS PATIENT: 35 minutes.  >50% time spent on counselling and coordination of care  Note: This dictation was prepared with Dragon dictation along with smaller phrase technology. Any transcriptional errors that result from this process are unintentional.  Fritzi Mandes M.D    [facility]   CC: Primary care physician; [place]

## 2021-08-03 NOTE — Evaluation (Signed)
Occupational Therapy Evaluation Patient Details Name: Zoe Boyle MRN: [id number] DOB: 01/18/1941 Today's Date: 08/03/2021   History of Present Illness Pt is a 81 y.o. female with medical history significant of CHF with EF of 25 to 30%, hypertension, hyperlipidemia, CKD-3A, anemia, who presents with shortness of breath. MD assessment includes  Acute on chronic combined systolic and diastolic congestive heart failure and myocardial injury due to CHF exacerbation   Clinical Impression   Chart reviewed, pt greeted in chair agreeable to OT evaluation. Pt reports feeling generally weak from hospitalizations, acute illness however performs all ADL/ ADL mobility with supervision, intermittent vcs for EC techniques during ADL completion. Pt reports daughter grocery shops and cleans, pt cooks and performs all ADL with MOD I-I. Pt with fair-good carry over of EC strategies. Would benefit from acute OT to facilitate carry over of energy conservation techniques. No further OT recommended following discharge. Pt is left as received, NAD, all needs met. OT will follow acutely.     Recommendations for follow up therapy are one component of a multi-disciplinary discharge planning process, led by the attending physician.  Recommendations may be updated based on patient status, additional functional criteria and insurance authorization.   Follow Up Recommendations    No OT follow up   Assistance Recommended at Discharge Intermittent Supervision/Assistance  Patient can return home with the following Assist for transportation;Assistance with cooking/housework    Functional Status Assessment  Patient has had a recent decline in their functional status and demonstrates the ability to make significant improvements in function in a reasonable and predictable amount of time.  Equipment Recommendations  None recommended by OT    Recommendations for Other Services       Precautions / Restrictions  Precautions Precautions: Fall Restrictions Weight Bearing Restrictions: No      Mobility Bed Mobility               General bed mobility comments: NT pt in recliner pre/post session    Transfers Overall transfer level: Needs assistance Equipment used: None Transfers: Sit to/from Stand Sit to Stand: Supervision                  Balance Overall balance assessment: Needs assistance Sitting-balance support: Feet supported Sitting balance-Leahy Scale: Good     Standing balance support: No upper extremity supported, During functional activity Standing balance-Leahy Scale: Fair                             ADL either performed or assessed with clinical judgement   ADL Overall ADL's : Needs assistance/impaired     Grooming: Wash/dry hands;Wash/dry face;Sitting;Standing Grooming Details (indicate cue type and reason): sink level with rest breaks Upper Body Bathing: Set up;Sitting   Lower Body Bathing: Supervison/ safety;Sit to/from stand   Upper Body Dressing : Set up;Sitting       Toilet Transfer: Copy Details (indicate cue type and reason): ambulatory Toileting- Clothing Manipulation and Hygiene: Supervision/safety;Sit to/from stand       Functional mobility during ADLs: Supervision/safety       Vision Patient Visual Report: No change from baseline       Perception     Praxis      Pertinent Vitals/Pain Pain Assessment Pain Assessment: No/denies pain     Hand Dominance     Extremity/Trunk Assessment Upper Extremity Assessment Upper Extremity Assessment: Overall WFL for tasks assessed   Lower Extremity Assessment Lower Extremity Assessment:  Generalized weakness   Cervical / Trunk Assessment Cervical / Trunk Assessment: Normal   Communication Communication Communication: No difficulties   Cognition Arousal/Alertness: Awake/alert Behavior During Therapy: WFL for tasks  assessed/performed Overall Cognitive Status: Within Functional Limits for tasks assessed                                       General Comments  BP 107/75 HR 80 at start of session    Exercises     Shoulder Instructions      Home Living Family/patient expects to be discharged to:: Private residence Living Arrangements: Children Available Help at Discharge: Family;Available 24 hours/day Type of Home: Apartment Home Access: Ramped entrance     Home Layout: One level     Bathroom Shower/Tub: Occupational psychologist: Standard     Home Equipment: None          Prior Functioning/Environment Prior Level of Function : Independent/Modified Independent             Mobility Comments: MOD I-I community amb with no AD; no fall history ADLs Comments: MOD I-I with ADL        OT Problem List: Decreased strength;Decreased activity tolerance      OT Treatment/Interventions: Self-care/ADL training;Therapeutic exercise;Patient/family education;Energy conservation;Therapeutic activities;DME and/or AE instruction    OT Goals(Current goals can be found in the care plan section) Acute Rehab OT Goals Patient Stated Goal: get stronger OT Goal Formulation: With patient Time For Goal Achievement: 08/17/21 Potential to Achieve Goals: Good ADL Goals Pt Will Transfer to Toilet: with modified independence;ambulating Pt Will Perform Toileting - Clothing Manipulation and hygiene: with modified independence Pt/caregiver will Perform Home Exercise Program: Increased strength;Both right and left upper extremity;With theraband  OT Frequency: Min 2X/week    Co-evaluation              AM-PAC OT "6 Clicks" Daily Activity     Outcome Measure Help from another person eating meals?: None Help from another person taking care of personal grooming?: None Help from another person toileting, which includes using toliet, bedpan, or urinal?: None Help from another  person bathing (including washing, rinsing, drying)?: A Little Help from another person to put on and taking off regular upper body clothing?: None Help from another person to put on and taking off regular lower body clothing?: None 6 Click Score: 23   End of Session Equipment Utilized During Treatment: Gait belt  Activity Tolerance: Patient tolerated treatment well Patient left: in chair;with family/visitor present;with chair alarm set  OT Visit Diagnosis: Muscle weakness (generalized) (M62.81)                Time: 0141-5973 OT Time Calculation (min): 21 min Charges:  OT General Charges $OT Visit: 1 Visit OT Evaluation $OT Eval Low Complexity: 1 Low Shanon Payor, OTD OTR/L  08/03/21, 1:07 PM

## 2021-08-03 NOTE — Evaluation (Signed)
Physical Therapy Evaluation Patient Details Name: Zoe Boyle MRN: [id number] DOB: Jan 31, 1940 Today's Date: 08/03/2021  History of Present Illness  Pt is a 81 y.o. female with medical history significant of CHF with EF of 25 to 30%, hypertension, hyperlipidemia, CKD-3A, anemia, who presents with shortness of breath. MD assessment includes  Acute on chronic combined systolic and diastolic congestive heart failure and myocardial injury due to CHF exacerbation  Clinical Impression  Pt was pleasant and motivated to participate during the session and put forth good effort throughout. Pt is able to complete bed mobility and transfers with supervision w/ no safety concerns. Pt ambulated 152ft using no AD w/ slow steady gait with occasional sway in direction but able to self correct with no LOB. HR in low 90s at rest and in low 120s after activity but able to quickly improve to mid-high 90s after rest. SpO2 on 98% on 2L and was able to maintain in high 90s on room air after activity w/ no adverse symptoms observed/reported; RN notified and given OK to leave on room air. No PT follow up recommended at this time as pt is performing all mobility tasks at her baseline function.      Recommendations for follow up therapy are one component of a multi-disciplinary discharge planning process, led by the attending physician.  Recommendations may be updated based on patient status, additional functional criteria and insurance authorization.  Follow Up Recommendations No PT follow up      Assistance Recommended at Discharge Intermittent Supervision/Assistance  Patient can return home with the following  Assistance with cooking/housework;Help with stairs or ramp for entrance;Assist for transportation    Equipment Recommendations None recommended by PT  Recommendations for Other Services       Functional Status Assessment Patient has not had a recent decline in their functional status     Precautions /  Restrictions Precautions Precautions: Fall Restrictions Weight Bearing Restrictions: No      Mobility  Bed Mobility Overal bed mobility: Needs Assistance Bed Mobility: Supine to Sit     Supine to sit: Supervision          Transfers Overall transfer level: Needs assistance Equipment used: None Transfers: Sit to/from Stand Sit to Stand: Supervision                Ambulation/Gait Ambulation/Gait assistance: Supervision Gait Distance (Feet): 120 Feet Assistive device: None Gait Pattern/deviations: Step-through pattern, Decreased step length - right, Decreased step length - left Gait velocity: decreased     General Gait Details: slow steady gait w/ no LOB; ocassional sway in direction but able to self correct  Stairs            Wheelchair Mobility    Modified Rankin (Stroke Patients Only)       Balance Overall balance assessment: Needs assistance Sitting-balance support: Feet supported Sitting balance-Leahy Scale: Good     Standing balance support: No upper extremity supported, During functional activity Standing balance-Leahy Scale: Fair                               Pertinent Vitals/Pain Pain Assessment Pain Assessment: No/denies pain    Home Living Family/patient expects to be discharged to:: Private residence Living Arrangements: Children Available Help at Discharge: Family;Available 24 hours/day Type of Home: Apartment Home Access: Ramped entrance       Home Layout: One level Home Equipment: None      Prior Function Prior  Level of Function : Independent/Modified Independent             Mobility Comments: Ind community ambulator using no AD; no hx of falls ADLs Comments: Ind w/ ADLs     Hand Dominance        Extremity/Trunk Assessment   Upper Extremity Assessment Upper Extremity Assessment: Overall WFL for tasks assessed    Lower Extremity Assessment Lower Extremity Assessment: Generalized weakness        Communication   Communication: No difficulties  Cognition Arousal/Alertness: Awake/alert Behavior During Therapy: WFL for tasks assessed/performed Overall Cognitive Status: Within Functional Limits for tasks assessed                                          General Comments      Exercises General Exercises - Lower Extremity Ankle Circles/Pumps: Strengthening, Both, 10 reps Quad Sets: Strengthening, Both, 10 reps Gluteal Sets: Strengthening, Both, 10 reps Long Arc Quad: Strengthening, Both, 10 reps   Assessment/Plan    PT Assessment Patient does not need any further PT services  PT Problem List Decreased strength;Decreased activity tolerance;Decreased balance       PT Treatment Interventions DME instruction;Therapeutic exercise;Gait training;Balance training;Stair training;Functional mobility training;Therapeutic activities;Patient/family education    PT Goals (Current goals can be found in the Care Plan section)  Acute Rehab PT Goals Patient Stated Goal: pt would like to get back to normal daily living PT Goal Formulation: With patient Time For Goal Achievement: 08/16/21 Potential to Achieve Goals: Good    Frequency       Co-evaluation               AM-PAC PT "6 Clicks" Mobility  Outcome Measure Help needed turning from your back to your side while in a flat bed without using bedrails?: None Help needed moving from lying on your back to sitting on the side of a flat bed without using bedrails?: None Help needed moving to and from a bed to a chair (including a wheelchair)?: None Help needed standing up from a chair using your arms (e.g., wheelchair or bedside chair)?: None Help needed to walk in hospital room?: None Help needed climbing 3-5 steps with a railing? : A Little 6 Click Score: 23    End of Session Equipment Utilized During Treatment: Gait belt Activity Tolerance: Patient tolerated treatment well Patient left: in chair;with  chair alarm set;with call bell/phone within reach Nurse Communication: Mobility status PT Visit Diagnosis: Muscle weakness (generalized) (M62.81);Difficulty in walking, not elsewhere classified (R26.2)    Time: 0998-3382 PT Time Calculation (min) (ACUTE ONLY): 24 min   Charges:   PT Evaluation $PT Eval Low Complexity: 1 Low PT Treatments $Therapeutic Exercise: 8-22 mins        Marica Otter, SPT  08/03/2021, 11:39 AM

## 2021-08-04 DIAGNOSIS — I5043 Acute on chronic combined systolic (congestive) and diastolic (congestive) heart failure: Secondary | ICD-10-CM | POA: Diagnosis not present

## 2021-08-04 LAB — BASIC METABOLIC PANEL
Anion gap: 6 (ref 5–15)
BUN: 57 mg/dL — ABNORMAL HIGH (ref 8–23)
CO2: 27 mmol/L (ref 22–32)
Calcium: 9.3 mg/dL (ref 8.9–10.3)
Chloride: 111 mmol/L (ref 98–111)
Creatinine, Ser: 1.45 mg/dL — ABNORMAL HIGH (ref 0.44–1.00)
GFR, Estimated: 37 mL/min — ABNORMAL LOW (ref >60)
Glucose, Bld: 119 mg/dL — ABNORMAL HIGH (ref 70–99)
Potassium: 4.5 mmol/L (ref 3.5–5.1)
Sodium: 144 mmol/L (ref 135–145)

## 2021-08-04 MED ORDER — ENSURE ENLIVE PO LIQD: 237.0000 mL | Freq: Three times a day (TID) | ORAL | 12 refills | Status: AC

## 2021-08-04 MED ORDER — TORSEMIDE 20 MG PO TABS: 40.0000 mg | ORAL_TABLET | Freq: Two times a day (BID) | ORAL | Status: DC

## 2021-08-04 MED ORDER — ADULT MULTIVITAMIN W/MINERALS CH: 1.0000 | ORAL_TABLET | Freq: Every day | ORAL | 0 refills | Status: DC

## 2021-08-04 MED ORDER — CARVEDILOL 6.25 MG PO TABS: 6.2500 mg | ORAL_TABLET | Freq: Two times a day (BID) | ORAL | 1 refills | Status: DC

## 2021-08-04 MED ORDER — FUROSEMIDE 10 MG/ML IJ SOLN
80.0000 mg | Freq: Two times a day (BID) | INTRAMUSCULAR | Status: DC
  Administered 2021-08-04: 80 mg via INTRAVENOUS
  Filled 2021-08-04: qty 8

## 2021-08-04 MED ORDER — CARVEDILOL 6.25 MG PO TABS: 6.2500 mg | ORAL_TABLET | Freq: Two times a day (BID) | ORAL | Status: DC

## 2021-08-04 MED ORDER — TORSEMIDE 40 MG PO TABS: 40.0000 mg | ORAL_TABLET | Freq: Two times a day (BID) | ORAL | 1 refills | Status: DC

## 2021-08-04 MED ORDER — TORSEMIDE 20 MG PO TABS: 40.0000 mg | ORAL_TABLET | Freq: Every day | ORAL | Status: DC

## 2021-08-04 NOTE — TOC Transition Note (Signed)
Transition of Care [facility]) - CM/SW Discharge Note   Patient Details  Name: Zoe Boyle MRN: [id number] Date of Birth: 01/08/1941  [facility] CM/SW Contact:  Margarito Liner, LCSW Phone Number: 08/04/2021, 12:50 PM   Clinical Narrative:   Patient has orders home today. MD recommending a home health nurse for CHF management but patient declined. Encouraged her to notify her PCP if she changes her mind later on. No further concerns. CSW signing off.  Final next level of care: Home/Self Care Barriers to Discharge: No Barriers Identified   Patient Goals and CMS Choice        Discharge Placement                    Patient and family notified of of transfer: 08/04/21  Discharge Plan and Services                                     Social Determinants of Health (SDOH) Interventions     Readmission Risk Interventions     No data to display

## 2021-08-04 NOTE — Consult Note (Signed)
   Heart Failure Nurse Navigator Note  HFrEF 25-30%.  Left ventricular internal cavity is moderately to severely dilated.  Severely dilated left atria.Severe mitral regurgitation.  Severe tricuspid regurgitation.  Moderately elevated pulmonary artery systolic pressures.  She presented to the emergency room with complaints of shortness of breath.  She also noted a dry cough.  BNP was greater than 4500.  Chest x-ray revealed vascular congestion.  Comorbidities:  Hyperlipidemia Hypertension Normocytic anemia Chronic kidney disease stage III  Medications:  Aspirin 81 mg daily Furosemide 80 mg IV 2 times a day Pravastatin 20 mg daily Coreg 6.25 mg 2 times a day  Labs:  Sodium 144, potassium 4.5, chloride 111, CO2 27, BUN 57, creatinine 1.54, estimated GFR 37 Weight is 47 kg Blood pressure 100/79 Intake 360 mL Output 1300 mL  Initial meeting with patient, she was lying on her side in bed, in no acute distress.  There were no family members at the bedside.  She states she lives with her daughter.  She does not weigh herself on a daily basis. Discussed the importance of daily weights and what to report.  Went over fluid intake, with what she listed that she drinks -she does not exceed 64 ounces daily.  They do not cook with salt nor does she add at the table.  She felt she did not eat processed foods or foods with higher sodium content.  She felt she was compliant with taking her medications as ordered.  She has follow up in the outpatient heart failure clinic on  July 21 at 2 PM.  She had no further questions.  She was given the heart failure teaching booklet, zone magnet, weight chart and info on low sodium and heart failure.  Tresa Endo EN CHFN

## 2021-08-04 NOTE — Care Management Important Message (Signed)
Important Message  Patient Details  Name: Zoe Boyle MRN: [id number] Date of Birth: 05-03-1940   Medicare Important Message Given:  Yes     [name] 08/04/2021, 11:01 AM

## 2021-08-04 NOTE — Discharge Summary (Signed)
Physician Discharge Summary   Patient: Zoe Boyle MRN: [id number] DOB: Mar 17, 1940  Admit date:     08/01/2021  Discharge date: 08/04/21  Discharge Physician: Enedina Finner   PCP: Enid Baas, MD   Recommendations at discharge:    F/u Dr Juliann Pares in 1-2 weeks F/u PCP in 1-2 weeks  Discharge Diagnoses: Principal Problem:   Acute on chronic combined systolic and diastolic congestive heart failure (HCC) Active Problems:   Myocardial injury   Acute renal failure superimposed on stage 3a chronic kidney disease (HCC)   Hypertension   Anemia of chronic disease   Protein-calorie malnutrition, moderate (HCC)   HLD (hyperlipidemia)   Depression   Hospital Course:   Zoe Boyle is a 81 y.o. female with medical history significant of CHF with EF of 25 to 30%, hypertension, hyperlipidemia, CKD-3A, anemia, who presents with shortness of breath.  Patient was recently hospitalized from 6/11 - 6/15 due to group A streptococcus bacteremia.  Patient was treated with IV antibiotics and discharged on amoxicillin.  Patient finished a course of amoxicillin.   Chest x-ray showed vascular congestion and cardiomegaly   Acute on chronic combined systolic and diastolic congestive heart failure (HCC) --2D echo on 07/10/2021 showed EF of 25-30% with grade 1 diastolic dysfunction.   --Patient has trace leg edema, positive JVD, BNP> 4500, vascular congestion by chest x-ray, clinically consistent with CHF exacerbation. --Borderline blood pressure. --Cardiology was consulted--increased dose of lasix and low dose coreg  --UOP ~2700 cc -Low salt diet -pt overall feels better. Ambulating by herself   Elevated troponin suspect demand ischemia --Myocardial injury due to CHF exacerbation. Troponin level 87 --> 90>>109, denies chest pain --A1c of 5.2 -Trend troponin and lipid panel with LDL of 111. -Aspirin and Pravastatin   Acute renal failure superimposed on stage 3a chronic kidney disease (HCC) Baseline  creatinine 0.83 on 07/13/2021.  Her creatinine is 1.95>>2.09, BUN 63, likely due to cardiorenal syndrome.>1.4 -Patient is on IV Lasix--change to po torsemide --resume spironolactone at d/c    Hypertension - IV hydralazine as needed -Continue  Coreg. --bp soft--d/ced hydralazine   Anemia of chronic disease Hemoglobin stable 10.2 (9.1 07/13/2021)   Protein-calorie malnutrition, moderate (HCC) Body weight 59 kg, BMI 25.40 -Start Ensure -Dietitian consult noted   HLD (hyperlipidemia) - Pravastatin   Depression -Continue remeron  Overall improving HHRN for CHF D/c home   Procedures: Family communication :dter Howell Pringle in the room Consults :[facility] cards CODE STATUS: FULL DVT Prophylaxis :enoxaparin     Disposition: Home health Diet recommendation:  Discharge Diet Orders (From admission, onward)     Start     Ordered   08/04/21 0000  Diet - low sodium heart healthy        08/04/21 1216           Cardiac diet DISCHARGE MEDICATION: Allergies as of 08/04/2021       Reactions   Lisinopril Swelling   TONGUE SWELLED        Medication List     STOP taking these medications    amoxicillin 875 MG tablet Commonly known as: AMOXIL   furosemide 40 MG tablet Commonly known as: LASIX   hydrALAZINE 50 MG tablet Commonly known as: APRESOLINE       TAKE these medications    aspirin EC 81 MG tablet Take 81 mg by mouth daily.   carvedilol 6.25 MG tablet Commonly known as: COREG Take 1 tablet (6.25 mg total) by mouth 2 (two) times daily with a meal.  What changed:  medication strength how much to take when to take this   feeding supplement Liqd Take 237 mLs by mouth 3 (three) times daily between meals.   mirtazapine 15 MG tablet Commonly known as: REMERON Take 15 mg by mouth at bedtime.   multivitamin with minerals Tabs tablet Take 1 tablet by mouth daily. Start taking on: August 05, 2021   pravastatin 20 MG tablet Commonly known as: PRAVACHOL Take 20  mg by mouth daily.   spironolactone 25 MG tablet Commonly known as: ALDACTONE Take 12.5 mg by mouth daily.   Torsemide 40 MG Tabs Take 40 mg by mouth 2 (two) times daily. Take 40 mg twice a day for 5 days and then change to 40 mg po daily Start taking on: August 05, 2021        Follow-up Information     Lujean Amel D, MD. Go in 1 week(s).   Specialties: Cardiology, Internal Medicine Contact information: [place] [address] [phone]         Gladstone Lighter, MD. Schedule an appointment as soon as possible for a visit.   Specialty: Internal Medicine Why: chf f/u Contact information: [place] [address] [phone]                Discharge Exam: Danley Danker Weights   07/31/21 2344 08/03/21 0500 08/04/21 0446  Weight: 59 kg 58.2 kg 47 kg     Condition at discharge: fair  The results of significant diagnostics from this hospitalization (including imaging, microbiology, ancillary and laboratory) are listed below for reference.   Imaging Studies: ECHOCARDIOGRAM LIMITED  Result Date: 08/02/2021    ECHOCARDIOGRAM LIMITED REPORT   Patient Name:   Zoe Boyle Date of Exam: 08/02/2021 Medical Rec #:  [id number]  Height:       60.0 in Accession #:    [id number] Weight:       130.1 lb Date of Birth:  May 02, 1940  BSA:          1.555 m Patient Age:    81 years   BP:           88/73 mmHg Patient Gender: F          HR:           103 bpm. Exam Location:  [facility] Procedure: Limited Echo, Limited Color Doppler and Cardiac Doppler Indications:     I50.9 Congestive Heart Failure  History:         Patient has prior history of Echocardiogram examinations, most                  recent 07/10/2021. CHF, CKD; Risk Factors:Hypertension and                  Dyslipidemia.  Sonographer:     Charmayne Sheer Referring Phys:  [id number] Thurmond Butts MATTHEW ORGEL Diagnosing Phys: Donnelly Angelica IMPRESSIONS  1. Left ventricular ejection fraction, by estimation, is 25 to 30%.  The left ventricle has severely decreased function. The left ventricle demonstrates global hypokinesis. The left ventricular internal cavity size was moderately to severely dilated.  2. Left atrial size was severely dilated.  3. A small pericardial effusion is present. There is no evidence of cardiac tamponade.  4. The mitral valve is normal in structure. Severe mitral valve regurgitation.  5. Tricuspid valve regurgitation is severe.  6. The aortic valve is normal in structure.  7. There is moderately elevated pulmonary artery systolic pressure.  8. The inferior vena cava is dilated in size with <50% respiratory variability, suggesting right atrial pressure of 15 mmHg. Conclusion(s)/Recommendation(s): Limited study. No change in pericardial effusion. Ef remains poor. FINDINGS  Left Ventricle: Left ventricular ejection fraction, by estimation, is 25 to 30%. The left ventricle has severely decreased function. The left ventricle demonstrates global hypokinesis. The left ventricular internal cavity size was moderately to severely  dilated. There is no left ventricular hypertrophy. Right Ventricle: There is moderately elevated pulmonary artery systolic pressure. The tricuspid regurgitant velocity is 3.04 m/s, and with an assumed right atrial pressure of 15 mmHg, the estimated right ventricular systolic pressure is [id number] mmHg. Left Atrium: Left atrial size was severely dilated. Pericardium: A small pericardial effusion is present. There is no evidence of cardiac tamponade. Mitral Valve: The mitral valve is normal in structure. Severe mitral valve regurgitation, with centrally-directed jet. Tricuspid Valve: The tricuspid valve is normal in structure. Tricuspid valve regurgitation is severe. Aortic Valve: The aortic valve is normal in structure. Pulmonic Valve: The pulmonic valve was normal in structure. Venous: The inferior vena cava is dilated in size with less than 50% respiratory variability, suggesting right atrial  pressure of 15 mmHg. IAS/Shunts: No atrial level shunt detected by color flow Doppler. LEFT VENTRICLE PLAX 2D LVIDd:         6.62 cm LVIDs:         5.83 cm LV PW:         1.06 cm LV IVS:        0.74 cm  LV Volumes (MOD) LV vol d, MOD A2C: 191.0 ml LV vol d, MOD A4C: 205.0 ml LV vol s, MOD A2C: 134.0 ml LV vol s, MOD A4C: 149.0 ml LV SV MOD A2C:     57.0 ml LV SV MOD A4C:     205.0 ml LV SV MOD BP:      58.3 ml LEFT ATRIUM         Index LA diam:    5.90 cm 3.79 cm/m  TRICUSPID VALVE TR Peak grad:   37.0 mmHg TR Vmax:        304.00 cm/s Donnelly Angelica Electronically signed by Donnelly Angelica Signature Date/Time: 08/02/2021/1:07:50 PM    Final    DG Chest Portable 1 View  Result Date: 08/01/2021 CLINICAL DATA:  81 year old female with shortness of breath. EXAM: PORTABLE CHEST 1 VIEW COMPARISON:  Chest radiograph 08/20/2021 and earlier. FINDINGS: Portable AP upright view at 0637 hours. Severe cardiomegaly ( CT Abdomen and Pelvis 07/09/2021). Stable cardiac size and mediastinal contours. Slightly lower lung volumes. Stable pulmonary vascular congestion. No pneumothorax, pleural effusion or consolidation. Visualized tracheal air column is within normal limits. No acute osseous abnormality identified. Negative visible bowel gas. IMPRESSION: 1. Severe cardiomegaly with stable pulmonary vascular congestion, mild if any interstitial edema. 2. No new cardiopulmonary abnormality. Electronically Signed   By: Genevie Ann M.D.   On: 08/01/2021 07:35   DG Chest 2 View  Result Date: 07/31/2021 CLINICAL DATA:  Shortness of breath EXAM: CHEST - 2 VIEW COMPARISON:  07/09/2021 FINDINGS: Cardiac shadow is enlarged but stable. Lungs are well aerated bilaterally. No focal infiltrate or sizable effusion is seen. No bony abnormality is noted. IMPRESSION: Stable cardiomegaly.  No acute abnormality seen. Electronically Signed   By: Inez Catalina M.D.   On: 07/31/2021 23:58   ECHOCARDIOGRAM COMPLETE  Result Date: 07/10/2021    ECHOCARDIOGRAM REPORT    Patient Name:   Zoe Boyle Date of Exam: 07/10/2021 Medical  Rec #:  [id number]  Height:       60.0 in Accession #:    [id number] Weight:       128.7 lb Date of Birth:  Dec 11, 1940  BSA:          1.548 m Patient Age:    3 years   BP:           100/71 mmHg Patient Gender: F          HR:           76 bpm. Exam Location:  [facility] Procedure: 2D Echo, Color Doppler and Cardiac Doppler Indications:     I50.9 Congestive heart failure  History:         Patient has prior history of Echocardiogram examinations. CHF;                  Risk Factors:Hypertension and Former Smoker.  Sonographer:     Rosalia Hammers Referring Phys:  [id number] Emeterio Reeve Diagnosing Phys: Yolonda Kida MD IMPRESSIONS  1. Left ventricular ejection fraction, by estimation, is 25 to 30%. The left ventricle has severely decreased function. The left ventricle demonstrates global hypokinesis. The left ventricular internal cavity size was moderately to severely dilated. There is mild left ventricular hypertrophy. Left ventricular diastolic parameters are consistent with Grade I diastolic dysfunction (impaired relaxation).  2. Right ventricular systolic function is low normal. The right ventricular size is mildly enlarged.  3. Left atrial size was mild to moderately dilated.  4. Right atrial size was mild to moderately dilated.  5. The mitral valve is grossly normal. 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  6. 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  7. The aortic valve is grossly normal. Aortic valve regurgitation is mild. FINDINGS  Left Ventricle: Left ventricular ejection fraction, by estimation, is 25 to 30%. The left ventricle has severely decreased function. The left ventricle demonstrates global hypokinesis. The left ventricular internal cavity size was moderately to severely  dilated. There is mild left ventricular hypertrophy. Abnormal (paradoxical) septal motion, consistent with left bundle branch block. Left ventricular  diastolic parameters are consistent with Grade I diastolic dysfunction (impaired relaxation). Right Ventricle: The right ventricular size is mildly enlarged. No increase in right ventricular wall thickness. Right ventricular systolic function is low normal. Left Atrium: Left atrial size was mild to moderately dilated. Right Atrium: Right atrial size was mild to moderately dilated. Pericardium: There is no evidence of pericardial effusion. Mitral Valve: The mitral valve is grossly normal. 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 MV peak gradient, 5.2 mmHg. The mean mitral valve gradient is 3.0 mmHg. Tricuspid Valve: The tricuspid valve is grossly normal. 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 Aortic Valve: The aortic valve is grossly normal. Aortic valve regurgitation is mild. Aortic valve mean gradient measures 12.0 mmHg. Aortic valve peak gradient measures 23.0 mmHg. Aortic valve area, by VTI measures 1.33 cm. Pulmonic Valve: The pulmonic valve was normal in structure. Pulmonic valve regurgitation is trivial. Aorta: The ascending aorta was not well visualized. IAS/Shunts: No atrial level shunt detected by color flow Doppler.  LEFT VENTRICLE PLAX 2D LVIDd:         5.67 cm      Diastology LVIDs:         4.88 cm      LV e' medial:    4.38 cm/s LV PW:         1.51 cm      LV E/e' medial:  25.6  LV IVS:        1.44 cm      LV e' lateral:   11.60 cm/s LVOT diam:     1.70 cm      LV E/e' lateral: 9.7 LV SV:         48 LV SV Index:   31 LVOT Area:     2.27 cm  LV Volumes (MOD) LV vol d, MOD A2C: 202.0 ml LV vol d, MOD A4C: 161.0 ml LV vol s, MOD A2C: 132.0 ml LV vol s, MOD A4C: 113.0 ml LV SV MOD A2C:     70.0 ml LV SV MOD A4C:     161.0 ml LV SV MOD BP:      54.3 ml RIGHT VENTRICLE RV Basal diam:  4.46 cm RV S prime:     11.70 cm/s LEFT ATRIUM              Index         RIGHT ATRIUM           Index LA diam:        5.40 cm  3.49 cm/m    RA Area:     28.40 cm LA Vol (A2C):   180.0 ml 116.30 ml/m  RA  Volume:   88.90 ml  57.44 ml/m LA Vol (A4C):   163.0 ml 105.32 ml/m LA Biplane Vol: 173.0 ml 111.78 ml/m  AORTIC VALVE                     PULMONIC VALVE AV Area (Vmax):    1.22 cm      PV Vmax:          0.84 m/s AV Area (Vmean):   1.31 cm      PV Vmean:         58.600 cm/s AV Area (VTI):     1.33 cm      PV VTI:           0.125 m AV Vmax:           240.00 cm/s   PV Peak grad:     2.8 mmHg AV Vmean:          158.000 cm/s  PV Mean grad:     2.0 mmHg AV VTI:            0.362 m       PR End Diast Vel: 6.05 msec AV Peak Grad:      23.0 mmHg AV Mean Grad:      12.0 mmHg LVOT Vmax:         129.00 cm/s LVOT Vmean:        91.400 cm/s LVOT VTI:          0.212 m LVOT/AV VTI ratio: 0.59  AORTA Ao Root diam: 2.90 cm MITRAL VALVE                TRICUSPID VALVE MV Area (PHT): 3.24 cm     TR Peak grad:   23.0 mmHg MV Area VTI:   0.91 cm     TR Vmax:        240.00 cm/s MV Peak grad:  5.2 mmHg MV Mean grad:  3.0 mmHg     SHUNTS MV Vmax:       1.14 m/s     Systemic VTI:  0.21 m MV Vmean:      83.3 cm/s    Systemic Diam: 1.70 cm MV Decel Time:  234 msec MV E velocity: 112.00 cm/s MV A velocity: 123.00 cm/s MV E/A ratio:  0.91 Dwayne Prince Rome MD Electronically signed by Yolonda Kida MD Signature Date/Time: 07/10/2021/5:36:48 PM    Final    CT ABDOMEN PELVIS WO CONTRAST  Result Date: 07/09/2021 CLINICAL DATA:  Diarrhea dairrhea, fever, sepsis of unknown origin EXAM: CT ABDOMEN AND PELVIS WITHOUT CONTRAST TECHNIQUE: Multidetector CT imaging of the abdomen and pelvis was performed following the standard protocol without IV contrast. RADIATION DOSE REDUCTION: This exam was performed according to the departmental dose-optimization program which includes automated exposure control, adjustment of the mA and/or kV according to patient size and/or use of iterative reconstruction technique. COMPARISON:  None Available. FINDINGS: Lower chest: Trace bilateral pleural effusions. Marked cardiomegaly. Small pericardial effusion.  Hypoattenuation of the cardiac blood pool is in keeping with at least moderate anemia. Hepatobiliary: Cholelithiasis without pericholecystic inflammatory change. Liver unremarkable. No definite intra or extrahepatic biliary ductal dilation on this noncontrast examination. Pancreas: Unremarkable Spleen: Unremarkable Adrenals/Urinary Tract: Adrenal glands are unremarkable. Kidneys are normal, without renal calculi, focal lesion, or hydronephrosis. Bladder is unremarkable. Stomach/Bowel: The stomach, small bowel, and large bowel are unremarkable on this noncontrast examination. The appendix is not clearly identified, however, no secondary signs of appendicitis are seen within the right lower quadrant of the abdomen. Trace ascites. No free intraperitoneal gas. Vascular/Lymphatic: Aortic atherosclerosis. No enlarged abdominal or pelvic lymph nodes. 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 Pelvic organs are otherwise unremarkable. Other: No abdominal wall hernia. Musculoskeletal: No acute bone abnormality. Degenerative changes are seen within the lumbar spine. IMPRESSION: No acute intra-abdominal pathology identified. No definite radiographic explanation for the patient's reported symptoms. Marked cardiomegaly. Moderate anemia. Small pericardial effusion, trace effusions, and trace ascites may reflect changes of cardiogenic failure and/or anasarca. Cholelithiasis Aortic Atherosclerosis (ICD10-I70.0). Electronically Signed   By: Fidela Salisbury M.D.   On: 07/09/2021 22:18   DG Chest Port 1 View  Result Date: 07/09/2021 CLINICAL DATA:  Question sepsis EXAM: PORTABLE CHEST 1 VIEW COMPARISON:  07/29/2020 FINDINGS: Cardiac silhouette is markedly enlarged. No change from comparison exam. Central venous congestion is mild. No pleural fluid. No pneumothorax. IMPRESSION: Marked cardiomegaly not changed from comparison exam. No acute findings. Electronically Signed   By: Suzy Bouchard  M.D.   On: 07/09/2021 20:59    Microbiology: Results for orders placed or performed during the hospital encounter of 08/01/21  SARS Coronavirus 2 by RT PCR (hospital order, performed in [facility] hospital lab) *cepheid single result test* Anterior Nasal Swab     Status: None   Collection Time: 08/01/21  6:13 AM   Specimen: Anterior Nasal Swab  Result Value Ref Range Status   SARS Coronavirus 2 by RT PCR NEGATIVE NEGATIVE Final    Comment: (NOTE) SARS-CoV-2 target nucleic acids are NOT DETECTED.  The SARS-CoV-2 RNA is generally detectable in upper and lower respiratory specimens during the acute phase of infection. The lowest concentration of SARS-CoV-2 viral copies this assay can detect is 250 copies / mL. A negative result does not preclude SARS-CoV-2 infection and should not be used as the sole basis for treatment or other patient management decisions.  A negative result may occur with improper specimen collection / handling, submission of specimen other than nasopharyngeal swab, presence of viral mutation(s) within the areas targeted by this assay, and inadequate number of viral copies (<250 copies / mL). A negative result must be combined with clinical observations, patient history, and epidemiological information.  Fact Sheet for Patients:   https://www.Charlei Ramsaran.info/  Fact Sheet for Healthcare Providers: https://hall.com/  This test is not yet approved or  cleared by the [place] FDA and has been authorized for detection and/or diagnosis of SARS-CoV-2 by FDA under an Emergency Use Authorization (EUA).  This EUA will remain in effect (meaning this test can be used) for the duration of the COVID-19 declaration under Section 564(b)(1) of the Act, 21 U.S.C. section 360bbb-3(b)(1), unless the authorization is terminated or revoked sooner.  Performed at [facility], [address] [place], [place] [address]      Labs: CBC: Recent Labs  Lab 07/31/21 2341 08/03/21 0512  WBC 5.7 5.3  NEUTROABS 2.8  --   HGB 10.2* 9.5*  HCT 31.8* 29.4*  MCV 86.4 85.2  PLT 176 [id number]*   Basic Metabolic Panel: Recent Labs  Lab 07/31/21 2341 08/02/21 0407 08/03/21 0512 08/03/21 1738 08/04/21 0545  NA 143 144 143 141 144  K 3.9 4.8 3.6 4.1 4.5  CL 110 110 108 106 111  CO2 18* 22 25 25 27   GLUCOSE 151* 112* 124* 132* 119*  BUN 43* 63* 66* 62* 57*  CREATININE 1.95* 2.09* 1.70* 1.73* 1.45*  CALCIUM 9.3 8.7* 9.1 9.2 9.3  MG  --   --   --  2.4  --    Liver Function Tests: Recent Labs  Lab 07/31/21 2341  AST 28  ALT 17  ALKPHOS 61  BILITOT 1.4*  PROT 8.3*  ALBUMIN 4.2   CBG: No results for input(s): "GLUCAP" in the last 168 hours.  Discharge time spent: greater than 30 minutes.  Signed: Fritzi Mandes, MD [facility] 08/04/2021

## 2021-08-04 NOTE — Progress Notes (Signed)
Zoe Boyle       Patient ID: Zoe Boyle MRN: [id number] DOB/AGE: 07-14-1940 81 y.o.  Admit date: 08/01/2021 Referring Physician Dr. Lorella Nimrod Primary Physician Dr. Tressia Miners  Primary Cardiologist Dr. Clayborn Bigness Reason for Consultation acute on chronic HFrEF  HPI: Zoe Boyle is a 81yoF with a PMH of HFrEF (LVEF 25-30% with global hypo, g1DD, mod-severe MR), hypertension hyperlipidemia who presented to [facility] ED 08/01/2021 with generalized weakness and shortness of breath over the past 3 days to a week.  Cardiology is consulted for assistance with her heart failure.  Interval History: -diuresed well overnight (net neg 1.4L), remained hemodynamically stable -renal function continues to improve, weaned to room air  -multiple periods of NSVT overnight -feeling better and wants to go home   Review of systems complete and found to be negative unless listed above     Past Medical History:  Diagnosis Date   Chronic combined systolic and diastolic CHF (congestive heart failure) (HCC)    CKD (chronic kidney disease), stage III (HCC)    Congestive heart failure (CHF) (HCC)    HLD (hyperlipidemia)    Hypertension    Normocytic anemia    Sepsis ([place]) 07/09/2021    History reviewed. No pertinent surgical history.  Medications Prior to Admission  Medication Sig Dispense Refill Last Dose   aspirin 81 MG EC tablet Take 81 mg by mouth daily.   07/31/2021   carvedilol (COREG) 25 MG tablet Take 25 mg by mouth in the morning and at bedtime.   07/31/2021   furosemide (LASIX) 40 MG tablet Take 40 mg by mouth daily.    07/31/2021   hydrALAZINE (APRESOLINE) 50 MG tablet Take 50 mg by mouth 3 (three) times daily.   07/31/2021   mirtazapine (REMERON) 15 MG tablet Take 15 mg by mouth at bedtime.   07/31/2021   pravastatin (PRAVACHOL) 20 MG tablet Take 20 mg by mouth daily.   07/31/2021   spironolactone (ALDACTONE) 25 MG tablet Take 12.5 mg by mouth daily.   07/31/2021   amoxicillin (AMOXIL)  875 MG tablet Take 1 tablet (875 mg total) by mouth 2 (two) times daily. (Patient not taking: Reported on 08/01/2021) 10 tablet 0 Completed Course    Social History   Socioeconomic History   Marital status: Divorced    Spouse name: Not on file   Number of children: Not on file   Years of education: Not on file   Highest education level: Not on file  Occupational History   Not on file  Tobacco Use   Smoking status: Former   Smokeless tobacco: Never  Substance and Sexual Activity   Alcohol use: Not Currently   Drug use: Not Currently   Sexual activity: Not on file  Other Topics Concern   Not on file  Social History Narrative   Not on file   Social Determinants of Health   Financial Resource Strain: Not on file  Food Insecurity: Not on file  Transportation Needs: Not on file  Physical Activity: Not on file  Stress: Not on file  Social Connections: Not on file  Intimate Partner Violence: Not on file    Family History  Family history unknown: Yes      PHYSICAL EXAM General: Elderly and frail appearing black female,  in no acute distress.  Sitting upright in PCU bed eating breakfast HEENT:  Normocephalic and atraumatic. Neck:  No JVD.  Lungs: Normal respiratory effort on room air, clear to ascultation bilaterally Heart: HRRR .  Normal S1 and S2 without gallops or murmurs. Radial & DP pulses 2+ bilaterally. Abdomen: Non-distended appearing.  Msk: Normal strength and tone for age. Extremities: Warm and well perfused. No clubbing, cyanosis. No peripheral edema.  Neuro: Alert and oriented X 3. Psych:  Answers questions appropriately  Labs:   Lab Results  Component Value Date   WBC 5.3 08/03/2021   HGB 9.5 (L) 08/03/2021   HCT 29.4 (L) 08/03/2021   MCV 85.2 08/03/2021   PLT 144 (L) 08/03/2021    Recent Labs  Lab 07/31/21 2341 08/02/21 0407 08/04/21 0545  NA 143   < > 144  K 3.9   < > 4.5  CL 110   < > 111  CO2 18*   < > 27  BUN 43*   < > 57*  CREATININE 1.95*    < > 1.45*  CALCIUM 9.3   < > 9.3  PROT 8.3*  --   --   BILITOT 1.4*  --   --   ALKPHOS 61  --   --   ALT 17  --   --   AST 28  --   --   GLUCOSE 151*   < > 119*   < > = values in this interval not displayed.    No results found for: "CKTOTAL", "CKMB", "CKMBINDEX", "TROPONINI"  Lab Results  Component Value Date   CHOL 171 08/01/2021   Lab Results  Component Value Date   HDL 46 08/01/2021   Lab Results  Component Value Date   LDLCALC 111 (H) 08/01/2021   Lab Results  Component Value Date   TRIG 68 08/01/2021   Lab Results  Component Value Date   CHOLHDL 3.7 08/01/2021   No results found for: "LDLDIRECT"    Radiology: ECHOCARDIOGRAM LIMITED  Result Date: 08/02/2021    ECHOCARDIOGRAM LIMITED REPORT   Patient Name:   Zoe Boyle Date of Exam: 08/02/2021 Medical Rec #:  [id number]  Height:       60.0 in Accession #:    [id number] Weight:       130.1 lb Date of Birth:  May 12, 1940  BSA:          1.555 m Patient Age:    81 years   BP:           88/73 mmHg Patient Gender: F          HR:           103 bpm. Exam Location:  [facility] Procedure: Limited Echo, Limited Color Doppler and Cardiac Doppler Indications:     I50.9 Congestive Heart Failure  History:         Patient has prior history of Echocardiogram examinations, most                  recent 07/10/2021. CHF, CKD; Risk Factors:Hypertension and                  Dyslipidemia.  Sonographer:     Charmayne Sheer Referring Phys:  [id number] Thurmond Butts MATTHEW ORGEL Diagnosing Phys: Donnelly Angelica IMPRESSIONS  1. Left ventricular ejection fraction, by estimation, is 25 to 30%. The left ventricle has severely decreased function. The left ventricle demonstrates global hypokinesis. The left ventricular internal cavity size was moderately to severely dilated.  2. Left atrial size was severely dilated.  3. A small pericardial effusion is present. There is no evidence of cardiac tamponade.  4. The mitral valve is normal in structure. Severe mitral valve regurgitation.  5.  Tricuspid valve regurgitation is severe.  6. The aortic valve is normal in structure.  7. There is moderately elevated pulmonary artery systolic pressure.  8. The inferior vena cava is dilated in size with <50% respiratory variability, suggesting right atrial pressure of 15 mmHg. Conclusion(s)/Recommendation(s): Limited study. No change in pericardial effusion. Ef remains poor. FINDINGS  Left Ventricle: Left ventricular ejection fraction, by estimation, is 25 to 30%. The left ventricle has severely decreased function. The left ventricle demonstrates global hypokinesis. The left ventricular internal cavity size was moderately to severely  dilated. There is no left ventricular hypertrophy. Right Ventricle: There is moderately elevated pulmonary artery systolic pressure. The tricuspid regurgitant velocity is 3.04 m/s, and with an assumed right atrial pressure of 15 mmHg, the estimated right ventricular systolic pressure is [id number] mmHg. Left Atrium: Left atrial size was severely dilated. Pericardium: A small pericardial effusion is present. There is no evidence of cardiac tamponade. Mitral Valve: The mitral valve is normal in structure. Severe mitral valve regurgitation, with centrally-directed jet. Tricuspid Valve: The tricuspid valve is normal in structure. Tricuspid valve regurgitation is severe. Aortic Valve: The aortic valve is normal in structure. Pulmonic Valve: The pulmonic valve was normal in structure. Venous: The inferior vena cava is dilated in size with less than 50% respiratory variability, suggesting right atrial pressure of 15 mmHg. IAS/Shunts: No atrial level shunt detected by color flow Doppler. LEFT VENTRICLE PLAX 2D LVIDd:         6.62 cm LVIDs:         5.83 cm LV PW:         1.06 cm LV IVS:        0.74 cm  LV Volumes (MOD) LV vol d, MOD A2C: 191.0 ml LV vol d, MOD A4C: 205.0 ml LV vol s, MOD A2C: 134.0 ml LV vol s, MOD A4C: 149.0 ml LV SV MOD A2C:     57.0 ml LV SV MOD A4C:     205.0 ml LV SV MOD BP:       58.3 ml LEFT ATRIUM         Index LA diam:    5.90 cm 3.79 cm/m  TRICUSPID VALVE TR Peak grad:   37.0 mmHg TR Vmax:        304.00 cm/s Donnelly Angelica Electronically signed by Donnelly Angelica Signature Date/Time: 08/02/2021/1:07:50 PM    Final    DG Chest Portable 1 View  Result Date: 08/01/2021 CLINICAL DATA:  81 year old female with shortness of breath. EXAM: PORTABLE CHEST 1 VIEW COMPARISON:  Chest radiograph 08/20/2021 and earlier. FINDINGS: Portable AP upright view at 0637 hours. Severe cardiomegaly ( CT Abdomen and Pelvis 07/09/2021). Stable cardiac size and mediastinal contours. Slightly lower lung volumes. Stable pulmonary vascular congestion. No pneumothorax, pleural effusion or consolidation. Visualized tracheal air column is within normal limits. No acute osseous abnormality identified. Negative visible bowel gas. IMPRESSION: 1. Severe cardiomegaly with stable pulmonary vascular congestion, mild if any interstitial edema. 2. No new cardiopulmonary abnormality. Electronically Signed   By: Genevie Ann M.D.   On: 08/01/2021 07:35   DG Chest 2 View  Result Date: 07/31/2021 CLINICAL DATA:  Shortness of breath EXAM: CHEST - 2 VIEW COMPARISON:  07/09/2021 FINDINGS: Cardiac shadow is enlarged but stable. Lungs are well aerated bilaterally. No focal infiltrate or sizable effusion is seen. No bony abnormality is noted. IMPRESSION: Stable cardiomegaly.  No acute abnormality seen. Electronically Signed   By: Inez Catalina M.D.   On: 07/31/2021 23:58  ECHOCARDIOGRAM COMPLETE  Result Date: 07/10/2021    ECHOCARDIOGRAM REPORT   Patient Name:   Zoe Boyle Date of Exam: 07/10/2021 Medical Rec #:  [id number]  Height:       60.0 in Accession #:    [id number] Weight:       128.7 lb Date of Birth:  [birth year]  BSA:          1.548 m Patient Age:    81 years   BP:           100/71 mmHg Patient Gender: F          HR:           76 bpm. Exam Location:  [facility] Procedure: 2D Echo, Color Doppler and Cardiac Doppler Indications:     I50.9  Congestive heart failure  History:         Patient has prior history of Echocardiogram examinations. CHF;                  Risk Factors:Hypertension and Former Smoker.  Sonographer:     Ceasar Mons Referring Phys:  [id number] Sunnie Nielsen Diagnosing Phys: Alwyn Pea MD IMPRESSIONS  1. Left ventricular ejection fraction, by estimation, is 25 to 30%. The left ventricle has severely decreased function. The left ventricle demonstrates global hypokinesis. The left ventricular internal cavity size was moderately to severely dilated. There is mild left ventricular hypertrophy. Left ventricular diastolic parameters are consistent with Grade I diastolic dysfunction (impaired relaxation).  2. Right ventricular systolic function is low normal. The right ventricular size is mildly enlarged.  3. Left atrial size was mild to moderately dilated.  4. Right atrial size was mild to moderately dilated.  5. The mitral valve is grossly normal. 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  6. 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  7. The aortic valve is grossly normal. Aortic valve regurgitation is mild. FINDINGS  Left Ventricle: Left ventricular ejection fraction, by estimation, is 25 to 30%. The left ventricle has severely decreased function. The left ventricle demonstrates global hypokinesis. The left ventricular internal cavity size was moderately to severely  dilated. There is mild left ventricular hypertrophy. Abnormal (paradoxical) septal motion, consistent with left bundle branch block. Left ventricular diastolic parameters are consistent with Grade I diastolic dysfunction (impaired relaxation). Right Ventricle: The right ventricular size is mildly enlarged. No increase in right ventricular wall thickness. Right ventricular systolic function is low normal. Left Atrium: Left atrial size was mild to moderately dilated. Right Atrium: Right atrial size was mild to moderately dilated. Pericardium: There is  no evidence of pericardial effusion. Mitral Valve: The mitral valve is grossly normal. 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 MV peak gradient, 5.2 mmHg. The mean mitral valve gradient is 3.0 mmHg. Tricuspid Valve: The tricuspid valve is grossly normal. 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 Aortic Valve: The aortic valve is grossly normal. Aortic valve regurgitation is mild. Aortic valve mean gradient measures 12.0 mmHg. Aortic valve peak gradient measures 23.0 mmHg. Aortic valve area, by VTI measures 1.33 cm. Pulmonic Valve: The pulmonic valve was normal in structure. Pulmonic valve regurgitation is trivial. Aorta: The ascending aorta was not well visualized. IAS/Shunts: No atrial level shunt detected by color flow Doppler.  LEFT VENTRICLE PLAX 2D LVIDd:         5.67 cm      Diastology LVIDs:         4.88 cm      LV e' medial:  4.38 cm/s LV PW:         1.51 cm      LV E/e' medial:  25.6 LV IVS:        1.44 cm      LV e' lateral:   11.60 cm/s LVOT diam:     1.70 cm      LV E/e' lateral: 9.7 LV SV:         48 LV SV Index:   31 LVOT Area:     2.27 cm  LV Volumes (MOD) LV vol d, MOD A2C: 202.0 ml LV vol d, MOD A4C: 161.0 ml LV vol s, MOD A2C: 132.0 ml LV vol s, MOD A4C: 113.0 ml LV SV MOD A2C:     70.0 ml LV SV MOD A4C:     161.0 ml LV SV MOD BP:      54.3 ml RIGHT VENTRICLE RV Basal diam:  4.46 cm RV S prime:     11.70 cm/s LEFT ATRIUM              Index         RIGHT ATRIUM           Index LA diam:        5.40 cm  3.49 cm/m    RA Area:     28.40 cm LA Vol (A2C):   180.0 ml 116.30 ml/m  RA Volume:   88.90 ml  57.44 ml/m LA Vol (A4C):   163.0 ml 105.32 ml/m LA Biplane Vol: 173.0 ml 111.78 ml/m  AORTIC VALVE                     PULMONIC VALVE AV Area (Vmax):    1.22 cm      PV Vmax:          0.84 m/s AV Area (Vmean):   1.31 cm      PV Vmean:         58.600 cm/s AV Area (VTI):     1.33 cm      PV VTI:           0.125 m AV Vmax:           240.00 cm/s   PV Peak grad:     2.8 mmHg AV  Vmean:          158.000 cm/s  PV Mean grad:     2.0 mmHg AV VTI:            0.362 m       PR End Diast Vel: 6.05 msec AV Peak Grad:      23.0 mmHg AV Mean Grad:      12.0 mmHg LVOT Vmax:         129.00 cm/s LVOT Vmean:        91.400 cm/s LVOT VTI:          0.212 m LVOT/AV VTI ratio: 0.59  AORTA Ao Root diam: 2.90 cm MITRAL VALVE                TRICUSPID VALVE MV Area (PHT): 3.24 cm     TR Peak grad:   23.0 mmHg MV Area VTI:   0.91 cm     TR Vmax:        240.00 cm/s MV Peak grad:  5.2 mmHg MV Mean grad:  3.0 mmHg     SHUNTS MV Vmax:       1.14 m/s  Systemic VTI:  0.21 m MV Vmean:      83.3 cm/s    Systemic Diam: 1.70 cm MV Decel Time: 234 msec MV E velocity: 112.00 cm/s MV A velocity: 123.00 cm/s MV E/A ratio:  0.91 Dwayne D Callwood MD Electronically signed by Alwyn Pea MD Signature Date/Time: 07/10/2021/5:36:48 PM    Final    CT ABDOMEN PELVIS WO CONTRAST  Result Date: 07/09/2021 CLINICAL DATA:  Diarrhea dairrhea, fever, sepsis of unknown origin EXAM: CT ABDOMEN AND PELVIS WITHOUT CONTRAST TECHNIQUE: Multidetector CT imaging of the abdomen and pelvis was performed following the standard protocol without IV contrast. RADIATION DOSE REDUCTION: This exam was performed according to the departmental dose-optimization program which includes automated exposure control, adjustment of the mA and/or kV according to patient size and/or use of iterative reconstruction technique. COMPARISON:  None Available. FINDINGS: Lower chest: Trace bilateral pleural effusions. Marked cardiomegaly. Small pericardial effusion. Hypoattenuation of the cardiac blood pool is in keeping with at least moderate anemia. Hepatobiliary: Cholelithiasis without pericholecystic inflammatory change. Liver unremarkable. No definite intra or extrahepatic biliary ductal dilation on this noncontrast examination. Pancreas: Unremarkable Spleen: Unremarkable Adrenals/Urinary Tract: Adrenal glands are unremarkable. Kidneys are normal, without  renal calculi, focal lesion, or hydronephrosis. Bladder is unremarkable. Stomach/Bowel: The stomach, small bowel, and large bowel are unremarkable on this noncontrast examination. The appendix is not clearly identified, however, no secondary signs of appendicitis are seen within the right lower quadrant of the abdomen. Trace ascites. No free intraperitoneal gas. Vascular/Lymphatic: Aortic atherosclerosis. No enlarged abdominal or pelvic lymph nodes. 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 Pelvic organs are otherwise unremarkable. Other: No abdominal wall hernia. Musculoskeletal: No acute bone abnormality. Degenerative changes are seen within the lumbar spine. IMPRESSION: No acute intra-abdominal pathology identified. No definite radiographic explanation for the patient's reported symptoms. Marked cardiomegaly. Moderate anemia. Small pericardial effusion, trace effusions, and trace ascites may reflect changes of cardiogenic failure and/or anasarca. Cholelithiasis Aortic Atherosclerosis (ICD10-I70.0). Electronically Signed   By: Helyn Numbers M.D.   On: 07/09/2021 22:18   DG Chest Port 1 View  Result Date: 07/09/2021 CLINICAL DATA:  Question sepsis EXAM: PORTABLE CHEST 1 VIEW COMPARISON:  07/29/2020 FINDINGS: Cardiac silhouette is markedly enlarged. No change from comparison exam. Central venous congestion is mild. No pleural fluid. No pneumothorax. IMPRESSION: Marked cardiomegaly not changed from comparison exam. No acute findings. Electronically Signed   By: Genevive Bi M.D.   On: 07/09/2021 20:59    ECHO 07/10/2021  1. Left ventricular ejection fraction, by estimation, is 25 to 30%. The  left ventricle has severely decreased function. The left ventricle  demonstrates global hypokinesis. The left ventricular internal cavity size  was moderately to severely dilated.  There is mild left ventricular hypertrophy. Left ventricular diastolic  parameters are  consistent with Grade I diastolic dysfunction (impaired  relaxation).   2. Right ventricular systolic function is low normal. The right  ventricular size is mildly enlarged.   3. Left atrial size was mild to moderately dilated.   4. Right atrial size was mild to moderately dilated.   5. The mitral valve is grossly normal. Moderate to severe mitral valve  regurgitation.   6. 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   7. The aortic valve is grossly normal. Aortic valve regurgitation is  mild.   TELEMETRY reviewed by me: sinus arrhythmia with periods of sinus tach into the low 100s, breaking into sinus rhythm in the 70s with occasional PVCs and dropped  sinus beats.   EKG reviewed by me: Sinus tachycardia rate 110, left axis deviation, LBBB  ASSESSMENT AND PLAN:  Arnesia Vincelette is a 81yoF with a PMH of HFrEF (LVEF 25-30% with global hypo, g1DD, mod-severe MR), hypertension hyperlipidemia who presented to [facility] ED 08/01/2021 with generalized weakness and shortness of breath over the past 3 days to a week.  Cardiology is consulted for assistance with her heart failure.  #acute on chronic HFrEF (LVEF 25-30%, g1dd, global hypo, mod-severe MR, small pericardial effusion)  She presents with a couple day history of worsening shortness of breath, generalized weakness, and poor appetite - drinking mostly water and juice per her report. Her CXR shows vascular congestion, BNP is markedly elevated at >4500 on admission, also requiring 2L of oxygen.  Her weight has increased 10 pounds since her most recent visit with her PCP on 6/22.  130 pounds on admisison. Lexiscan Myoview in 2021 that showed 40% LVEF without evidence of significant defects on perfusion scan, for which medical therapy was recommended. -s/p 40mg  IV lasix x3 doses with minimal UOP, s/p 80 mg IV Lasix x4 doses with good diuresis.    - will discharge on torsemide 40mg  BID x 5 days, then take 40mg  daily thereafter.  -Continue GDMT :  increase Coreg from 3.125 mg to 6.25mg  twice daily and hydralazine 10 mg 3 times daily, Imdur 15mg  daily.  Resume spironolactone 12.5mg  daily at discharge. (Home doses are Coreg 25 twice daily, hydralazine 50mg  3 times daily, imdur 30mg , spironolactone 12.5 once daily) -consider addition of jardiance for GDMT on an outpatient basis -defer ACEi/ARB with history of tongue swelling with lisinopril  -Repeat echocardiogram complete showed similar poor EF, stable small pericardial effusion -Strict I's/O, daily weights, salt and fluid restriction -recommend close follow up with Dr. and referral to EP for consideration of defibrillator for secondary prevention. This was recommended at her last visit in August 2022 but it looks like she was lost to follow up unfortunately.   #AKI Was discharged on 6/15 with normal renal function with Cr. 0.8 and GFR >60 On admission Cr 1.95-2.09 and GFR 26-24, improving with diuresis overnight with current Cr 1.45 and GFR 37 -recheck BMP in 1 week.   #Hypertension #Hyperlipidemia Continue aspirin 81 mg daily and pravastatin 40 mg  Ok for discharge today from a cardiac standpoint, needs close follow up in 1 week with Dr. and a recheck of BMP.   This patient's plan of care was discussed and created with Dr. and he is in agreement.  Signed: Juliann Pares , PA-C 08/04/2021, 8:46 AM [facility] Card[facility]

## 2021-08-18 ENCOUNTER — Ambulatory Visit (HOSPITAL_BASED_OUTPATIENT_CLINIC_OR_DEPARTMENT_OTHER): Payer: Medicare Other | Admitting: Family

## 2021-08-18 ENCOUNTER — Encounter: Payer: Self-pay | Admitting: Family

## 2021-08-18 ENCOUNTER — Other Ambulatory Visit
Admission: RE | Admit: 2021-08-18 | Discharge: 2021-08-18 | Disposition: A | Discharge status: Home / Self Care | Payer: Medicare Other | Source: Ambulatory Visit | Attending: Family | Admitting: Family

## 2021-08-18 VITALS — BP 134/83 | HR 114 | Resp 16 | Ht 60.0 in | Wt 108.0 lb

## 2021-08-18 DIAGNOSIS — I5022 Chronic systolic (congestive) heart failure: Secondary | ICD-10-CM | POA: Diagnosis present

## 2021-08-18 DIAGNOSIS — D631 Anemia in chronic kidney disease: Secondary | ICD-10-CM | POA: Insufficient documentation

## 2021-08-18 DIAGNOSIS — I5042 Chronic combined systolic (congestive) and diastolic (congestive) heart failure: Secondary | ICD-10-CM | POA: Insufficient documentation

## 2021-08-18 DIAGNOSIS — G479 Sleep disorder, unspecified: Secondary | ICD-10-CM | POA: Insufficient documentation

## 2021-08-18 DIAGNOSIS — D638 Anemia in other chronic diseases classified elsewhere: Secondary | ICD-10-CM | POA: Diagnosis not present

## 2021-08-18 DIAGNOSIS — N183 Chronic kidney disease, stage 3 unspecified: Secondary | ICD-10-CM | POA: Insufficient documentation

## 2021-08-18 DIAGNOSIS — I10 Essential (primary) hypertension: Secondary | ICD-10-CM | POA: Diagnosis not present

## 2021-08-18 DIAGNOSIS — I130 Hypertensive heart and chronic kidney disease with heart failure and stage 1 through stage 4 chronic kidney disease, or unspecified chronic kidney disease: Secondary | ICD-10-CM | POA: Insufficient documentation

## 2021-08-18 DIAGNOSIS — E785 Hyperlipidemia, unspecified: Secondary | ICD-10-CM | POA: Insufficient documentation

## 2021-08-18 LAB — BASIC METABOLIC PANEL
Anion gap: 12 (ref 5–15)
BUN: 51 mg/dL — ABNORMAL HIGH (ref 8–23)
CO2: 21 mmol/L — ABNORMAL LOW (ref 22–32)
Calcium: 9.4 mg/dL (ref 8.9–10.3)
Chloride: 105 mmol/L (ref 98–111)
Creatinine, Ser: 1.7 mg/dL — ABNORMAL HIGH (ref 0.44–1.00)
GFR, Estimated: 30 mL/min — ABNORMAL LOW (ref >60)
Glucose, Bld: 166 mg/dL — ABNORMAL HIGH (ref 70–99)
Potassium: 4.1 mmol/L (ref 3.5–5.1)
Sodium: 138 mmol/L (ref 135–145)

## 2021-08-18 MED ORDER — PRAVASTATIN SODIUM 20 MG PO TABS: 20.0000 mg | ORAL_TABLET | Freq: Every day | ORAL | 5 refills | Status: DC

## 2021-08-18 MED ORDER — ASPIRIN 81 MG PO TBEC
81.0000 mg | DELAYED_RELEASE_TABLET | Freq: Every day | ORAL | 5 refills | Status: DC
Start: 2021-08-18 — End: 2021-10-20

## 2021-08-18 MED ORDER — SPIRONOLACTONE 25 MG PO TABS
12.5000 mg | ORAL_TABLET | Freq: Every day | ORAL | 5 refills | Status: DC
Start: 2021-08-18 — End: 2021-10-20

## 2021-08-18 MED ORDER — TORSEMIDE 40 MG PO TABS: 40.0000 mg | ORAL_TABLET | Freq: Every day | ORAL | 5 refills | Status: AC

## 2021-08-18 MED ORDER — CARVEDILOL 6.25 MG PO TABS
6.2500 mg | ORAL_TABLET | Freq: Two times a day (BID) | ORAL | 5 refills | Status: DC
Start: 2021-08-18 — End: 2021-10-20

## 2021-08-18 NOTE — Progress Notes (Signed)
Patient ID: Zoe Boyle, female    DOB: [birth year], 81 y.o.   MRN: [id number]  HPI  Ms Goodnow is a 81 y/o female with a history of hyperlipidemia, HTN, CKD, anemia and chronic heart failure.   Echo report from 08/02/21 reviewed and showed an EF of 25-30% along with severe LAE, moderately elevated PA pressure and severe MR/TR.   Admitted 08/01/21 due to shortness of breath due to acute on chronic heart failure. Initially given IV lasix with transition to oral diuretics. Cardiology and palliative care consults obtained. Elevated troponin thought to be due to demand ischemia. Discharged after 3 days.   She presents today for her initial visit with a chief complaint of with moderate fatigue with minimal exertion. She describes this as chronic in nature. She has associated shortness of breath and chronic difficulty sleeping along with this. She denies any dizziness, cough, chest pain, pedal edema, palpitations, abdominal distention or weight gain.   She says that she's not drinking much fluids. She says that she's been out of all her medications for about a week.   Past Medical History:  Diagnosis Date   Chronic combined systolic and diastolic CHF (congestive heart failure) (HCC)    CKD (chronic kidney disease), stage III (HCC)    Congestive heart failure (CHF) (HCC)    HLD (hyperlipidemia)    Hypertension    Normocytic anemia    Sepsis (HCC) 07/09/2021   History reviewed. No pertinent surgical history. Family History  Family history unknown: Yes   Social History   Tobacco Use   Smoking status: Former   Smokeless tobacco: Never  Substance Use Topics   Alcohol use: Not Currently   Allergies  Allergen Reactions   Lisinopril Swelling    TONGUE SWELLED   Prior to Admission medications   Medication Sig Start Date End Date Taking? Authorizing Provider  aspirin EC 81 MG tablet Take 1 tablet (81 mg total) by mouth daily. 08/18/21   Delma Freeze, FNP  carvedilol (COREG) 6.25 MG tablet Take  1 tablet (6.25 mg total) by mouth 2 (two) times daily with a meal. 08/18/21   Delma Freeze, FNP  feeding supplement (ENSURE ENLIVE / ENSURE PLUS) LIQD Take 237 mLs by mouth 3 (three) times daily between meals. Patient not taking: Reported on 08/18/2021 08/04/21   Enedina Finner, MD  mirtazapine (REMERON) 15 MG tablet Take 15 mg by mouth at bedtime. Patient not taking: Reported on 08/18/2021 07/21/21   [provider]  Multiple Vitamin (MULTIVITAMIN WITH MINERALS) TABS tablet Take 1 tablet by mouth daily. Patient not taking: Reported on 08/18/2021 08/05/21   Enedina Finner, MD  pravastatin (PRAVACHOL) 20 MG tablet Take 1 tablet (20 mg total) by mouth daily. 08/18/21   Delma Freeze, FNP  spironolactone (ALDACTONE) 25 MG tablet Take 0.5 tablets (12.5 mg total) by mouth daily. 08/18/21   Clarisa Kindred A, FNP  Torsemide 40 MG TABS Take 40 mg by mouth daily. 08/18/21   Delma Freeze, FNP   Review of Systems  Constitutional:  Positive for fatigue (easily). Negative for appetite change.  HENT:  Negative for congestion, postnasal drip and sore throat.   Eyes: Negative.   Respiratory:  Positive for shortness of breath (easily). Negative for cough and chest tightness.   Cardiovascular:  Negative for chest pain, palpitations and leg swelling.  Gastrointestinal:  Negative for abdominal distention and abdominal pain.  Endocrine: Negative.   Genitourinary: Negative.   Musculoskeletal:  Negative for back pain and  neck pain.  Skin: Negative.   Allergic/Immunologic: Negative.   Neurological:  Negative for dizziness and light-headedness.  Hematological:  Negative for adenopathy. Does not bruise/bleed easily.  Psychiatric/Behavioral:  Positive for sleep disturbance (sleeping on 2 pillows). Negative for dysphoric mood. The patient is not nervous/anxious.    Vitals:   08/18/21 1421  BP: 134/83  Pulse: (!) 114  Resp: 16  SpO2: 100%  Weight: 108 lb (49 kg)  Height: 5' (1.524 m)   Wt Readings from Last 3  Encounters:  08/18/21 108 lb (49 kg)  08/04/21 103 lb 9.9 oz (47 kg)  07/09/21 128 lb 11.2 oz (58.4 kg)   Lab Results  Component Value Date   CREATININE 1.70 (H) 08/18/2021   CREATININE 1.45 (H) 08/04/2021   CREATININE 1.73 (H) 08/03/2021   Physical Exam Vitals and nursing note reviewed. Exam conducted with a chaperone present (granddaughter).  Constitutional:      Appearance: Normal appearance.  HENT:     Head: Normocephalic and atraumatic.  Cardiovascular:     Rate and Rhythm: Regular rhythm. Tachycardia present.  Pulmonary:     Effort: Pulmonary effort is normal. No respiratory distress.     Breath sounds: No wheezing or rales.  Abdominal:     General: There is no distension.     Palpations: Abdomen is soft.  Musculoskeletal:        General: No tenderness.     Cervical back: Normal range of motion and neck supple.     Right lower leg: No edema.     Left lower leg: No edema.  Skin:    General: Skin is warm and dry.  Neurological:     General: No focal deficit present.     Mental Status: She is alert and oriented to person, place, and time.  Psychiatric:        Mood and Affect: Mood normal.        Behavior: Behavior normal.        Thought Content: Thought content normal.    Assessment & Plan:  1: Chronic heart failure with reduced ejection fraction- - NYHA class III - euvolemic today - has been out of all medications for about a week; these were refilled today - had tongue swelling with lisinopril - saw cardiology Juliann Pares) 09/19/20 - explained that she needed to drink about 60 ounces of fluid daily - BNP 07/31/21 was >4500  2: HTN with CKD- - BP mildly elevated (134/83) - saw PCP (Fields) 07/20/21 - BMP 08/04/21 reviewed and showed sodium 144, potassium 4.5, creatinine 1.45 & GFR 37 - will check BMP today  3: Anemia- - Hg 08/03/21 was 9.5   Patient did not bring her medications nor a list. Each medication was verbally reviewed with the patient and she was  encouraged to bring the bottles to every visit to confirm accuracy of list.   Return in 6 weeks, sooner if needed.

## 2021-08-18 NOTE — Patient Instructions (Addendum)
Begin weighing daily and call for an overnight weight gain of 3 pounds or more or a weekly weight gain of more than 5 pounds.   If you have voicemail, please make sure your mailbox is cleaned out so that we may leave a message and please make sure to listen to any voicemails.    Drink 60-64 ounces of fluid daily

## 2021-09-12 ENCOUNTER — Emergency Department
Admission: EM | Admit: 2021-09-12 | Discharge: 2021-09-12 | Disposition: A | Discharge status: Home / Self Care | Payer: Medicare Other | Attending: Emergency Medicine | Admitting: Emergency Medicine

## 2021-09-12 ENCOUNTER — Other Ambulatory Visit: Payer: Self-pay

## 2021-09-12 ENCOUNTER — Emergency Department: Payer: Medicare Other

## 2021-09-12 DIAGNOSIS — R112 Nausea with vomiting, unspecified: Secondary | ICD-10-CM | POA: Diagnosis present

## 2021-09-12 DIAGNOSIS — I509 Heart failure, unspecified: Secondary | ICD-10-CM | POA: Diagnosis not present

## 2021-09-12 DIAGNOSIS — B349 Viral infection, unspecified: Secondary | ICD-10-CM | POA: Insufficient documentation

## 2021-09-12 DIAGNOSIS — Z20822 Contact with and (suspected) exposure to covid-19: Secondary | ICD-10-CM | POA: Insufficient documentation

## 2021-09-12 DIAGNOSIS — N189 Chronic kidney disease, unspecified: Secondary | ICD-10-CM | POA: Diagnosis not present

## 2021-09-12 DIAGNOSIS — I130 Hypertensive heart and chronic kidney disease with heart failure and stage 1 through stage 4 chronic kidney disease, or unspecified chronic kidney disease: Secondary | ICD-10-CM | POA: Diagnosis not present

## 2021-09-12 LAB — COMPREHENSIVE METABOLIC PANEL
ALT: 24 U/L (ref 0–44)
AST: 46 U/L — ABNORMAL HIGH (ref 15–41)
Albumin: 3.9 g/dL (ref 3.5–5.0)
Alkaline Phosphatase: 47 U/L (ref 38–126)
Anion gap: 13 (ref 5–15)
BUN: 50 mg/dL — ABNORMAL HIGH (ref 8–23)
CO2: 24 mmol/L (ref 22–32)
Calcium: 9.6 mg/dL (ref 8.9–10.3)
Chloride: 105 mmol/L (ref 98–111)
Creatinine, Ser: 2.06 mg/dL — ABNORMAL HIGH (ref 0.44–1.00)
GFR, Estimated: 24 mL/min — ABNORMAL LOW (ref >60)
Glucose, Bld: 179 mg/dL — ABNORMAL HIGH (ref 70–99)
Potassium: 4.4 mmol/L (ref 3.5–5.1)
Sodium: 142 mmol/L (ref 135–145)
Total Bilirubin: 1.1 mg/dL (ref 0.3–1.2)
Total Protein: 7.7 g/dL (ref 6.5–8.1)

## 2021-09-12 LAB — TROPONIN I (HIGH SENSITIVITY): Troponin I (High Sensitivity): 49 ng/L — ABNORMAL HIGH (ref <18)

## 2021-09-12 LAB — URINALYSIS, ROUTINE W REFLEX MICROSCOPIC
Bilirubin Urine: NEGATIVE
Glucose, UA: NEGATIVE mg/dL
Ketones, ur: NEGATIVE mg/dL
Leukocytes,Ua: NEGATIVE
Nitrite: NEGATIVE
Protein, ur: >=300 mg/dL — AB
Specific Gravity, Urine: 1.016 (ref 1.005–1.030)
pH: 5 (ref 5.0–8.0)

## 2021-09-12 LAB — CBC
HCT: 38.3 % (ref 36.0–46.0)
Hemoglobin: 12 g/dL (ref 12.0–15.0)
MCH: 26.9 pg (ref 26.0–34.0)
MCHC: 31.3 g/dL (ref 30.0–36.0)
MCV: 85.9 fL (ref 80.0–100.0)
Platelets: 142 10*3/uL — ABNORMAL LOW (ref 150–400)
RBC: 4.46 MIL/uL (ref 3.87–5.11)
RDW: 16.4 % — ABNORMAL HIGH (ref 11.5–15.5)
WBC: 6.3 10*3/uL (ref 4.0–10.5)
nRBC: 1 % — ABNORMAL HIGH (ref 0.0–0.2)

## 2021-09-12 LAB — LIPASE, BLOOD: Lipase: 42 U/L (ref 11–51)

## 2021-09-12 LAB — BRAIN NATRIURETIC PEPTIDE: B Natriuretic Peptide: >4500 pg/mL — ABNORMAL HIGH (ref 0.0–100.0)

## 2021-09-12 LAB — SARS CORONAVIRUS 2 BY RT PCR: SARS Coronavirus 2 by RT PCR: NEGATIVE

## 2021-09-12 MED ORDER — ONDANSETRON 4 MG PO TBDP
8.0000 mg | ORAL_TABLET | Freq: Once | ORAL | Status: AC
  Administered 2021-09-12: 8 mg via ORAL
  Filled 2021-09-12: qty 2

## 2021-09-12 MED ORDER — AZITHROMYCIN 250 MG PO TABS: ORAL_TABLET | ORAL | 0 refills | Status: DC

## 2021-09-12 MED ORDER — ONDANSETRON 4 MG PO TBDP: 4.0000 mg | ORAL_TABLET | Freq: Three times a day (TID) | ORAL | 0 refills | Status: AC | PRN

## 2021-09-12 NOTE — ED Provider Notes (Signed)
[facility] Provider Note    Event Date/Time   First MD Initiated Contact with Patient 09/12/21 [id number]     (approximate)   History   Chief Complaint: Emesis and Diarrhea   HPI  Zoe Boyle is a 81 y.o. female with a history of CHF, CKD, hypertension who comes to the ED complaining of abdominal discomfort, nausea vomiting diarrhea for the past 3 days.  Reports that multiple household family members have been sick with similar symptoms recently and she thinks she caught what her daughter has.  She endorses nonproductive cough.  No dyspnea on exertion or orthopnea.  No leg swelling.  No chest pain.  No fever.     Physical Exam   Triage Vital Signs: ED Triage Vitals [09/12/21 0513]  Enc Vitals Group     BP 100/89     Pulse Rate 92     Resp 20     Temp 97.8 F (36.6 C)     Temp Source Oral     SpO2 99 %     Weight 110 lb (49.9 kg)     Height 5' (1.524 m)     Head Circumference      Peak Flow      Pain Score 0     Pain Loc      Pain Edu?      Excl. in GC?     Most recent vital signs: Vitals:   09/12/21 0513 09/12/21 0817  BP: 100/89 106/88  Pulse: 92 79  Resp: 20 16  Temp: 97.8 F (36.6 C) 98 F (36.7 C)  SpO2: 99% 100%    General: Awake, no distress.  CV:  Good peripheral perfusion.  Loud systolic murmur. Resp:  Normal effort.  Clear to auscultation bilaterally without wheezing or crackles Abd:  No distention.  Soft and nontender Other:  Moist oral mucosa.  No lower extremity edema.  No rash.   ED Results / Procedures / Treatments   Labs (all labs ordered are listed, but only abnormal results are displayed) Labs Reviewed  CBC - Abnormal; Notable for the following components:      Result Value   RDW 16.4 (*)    Platelets 142 (*)    nRBC 1.0 (*)    All other components within normal limits  COMPREHENSIVE METABOLIC PANEL - Abnormal; Notable for the following components:   Glucose, Bld 179 (*)    BUN 50 (*)    Creatinine, Ser  2.06 (*)    AST 46 (*)    GFR, Estimated 24 (*)    All other components within normal limits  URINALYSIS, ROUTINE W REFLEX MICROSCOPIC - Abnormal; Notable for the following components:   Color, Urine AMBER (*)    APPearance CLOUDY (*)    Hgb urine dipstick SMALL (*)    Protein, ur >=300 (*)    Bacteria, UA RARE (*)    All other components within normal limits  BRAIN NATRIURETIC PEPTIDE - Abnormal; Notable for the following components:   B Natriuretic Peptide >4,500.0 (*)    All other components within normal limits  TROPONIN I (HIGH SENSITIVITY) - Abnormal; Notable for the following components:   Troponin I (High Sensitivity) 49 (*)    All other components within normal limits  SARS CORONAVIRUS 2 BY RT PCR  LIPASE, BLOOD     EKG Interpreted by me Sinus rhythm, rate of 84.  First-degree AV block.  Normal axis, left bundle branch block.  Normal ST segments  and T waves, no ischemic changes.   RADIOLOGY Chest x-ray interpreted by me, appears normal.  Radiology report reviewed   PROCEDURES:  Procedures   MEDICATIONS ORDERED IN ED: Medications  ondansetron (ZOFRAN-ODT) disintegrating tablet 8 mg (8 mg Oral Given 09/12/21 0818)     IMPRESSION / MDM / ASSESSMENT AND PLAN / ED COURSE  I reviewed the triage vital signs and the nursing notes.                              Differential diagnosis includes, but is not limited to, pneumonia, viral illness, pulmonary edema, pleural effusion.  Doubt ACS PE dissection pericardial effusion or CHF.  Not septic  Patient's presentation is most consistent with acute presentation with potential threat to life or bodily function.  Patient presents with cough congestion nausea vomiting diarrhea with sick household contacts consistent with a viral illness.  No other worrisome symptoms.  Vital signs are normal, on exam she is nontoxic and alert and pleasantly interactive and lungs are clear.  I will give Zofran to help with her nausea.  She is  not significantly dehydrated, and can continue her medications with symptom control.  I will prescribe a course of azithromycin to in case she is on the early side of developing pneumonia.       FINAL CLINICAL IMPRESSION(S) / ED DIAGNOSES   Final diagnoses:  Viral syndrome     Rx / DC Orders   ED Discharge Orders          Ordered    azithromycin (ZITHROMAX Z-PAK) 250 MG tablet        09/12/21 0813    ondansetron (ZOFRAN-ODT) 4 MG disintegrating tablet  Every 8 hours PRN        09/12/21 0813             Note:  This document was prepared using Dragon voice recognition software and may include unintentional dictation errors.   Sharman Cheek, MD 09/12/21 [phone]

## 2021-09-12 NOTE — ED Notes (Signed)
Pt given PO fluids to sip, reports relief form med. Will monitor.

## 2021-09-12 NOTE — ED Notes (Signed)
Po challenge good. MD advised pt feels safe for DC. Family at bedside.

## 2021-09-12 NOTE — Discharge Instructions (Addendum)
Your lab tests and chest x-ray today are all okay.  COVID test is negative.  Be sure to eat and drink fluids regularly, continue taking your medications, and follow-up with your primary care doctor this week.

## 2021-09-12 NOTE — ED Triage Notes (Signed)
Pt presents to ER with c/o n/v/d since Sunday.  Pt also states she has been feeling some congestion and has had a cough.  Pt states she has family members who have had similar symptoms that she has been around.  Pt is A&O x4 at this time in NAD in triage.

## 2021-09-25 ENCOUNTER — Encounter: Payer: Self-pay | Admitting: Family

## 2021-09-25 ENCOUNTER — Ambulatory Visit: Payer: Medicare Other | Attending: Family | Admitting: Family

## 2021-09-25 VITALS — BP 91/76 | HR 85 | Resp 16 | Ht 60.0 in | Wt 111.1 lb

## 2021-09-25 DIAGNOSIS — N189 Chronic kidney disease, unspecified: Secondary | ICD-10-CM | POA: Diagnosis not present

## 2021-09-25 DIAGNOSIS — I10 Essential (primary) hypertension: Secondary | ICD-10-CM | POA: Diagnosis not present

## 2021-09-25 DIAGNOSIS — D649 Anemia, unspecified: Secondary | ICD-10-CM | POA: Insufficient documentation

## 2021-09-25 DIAGNOSIS — I5022 Chronic systolic (congestive) heart failure: Secondary | ICD-10-CM | POA: Diagnosis present

## 2021-09-25 DIAGNOSIS — D638 Anemia in other chronic diseases classified elsewhere: Secondary | ICD-10-CM | POA: Diagnosis not present

## 2021-09-25 DIAGNOSIS — E785 Hyperlipidemia, unspecified: Secondary | ICD-10-CM | POA: Diagnosis not present

## 2021-09-25 DIAGNOSIS — I130 Hypertensive heart and chronic kidney disease with heart failure and stage 1 through stage 4 chronic kidney disease, or unspecified chronic kidney disease: Secondary | ICD-10-CM | POA: Diagnosis not present

## 2021-09-25 NOTE — Progress Notes (Signed)
Patient ID: Zoe Boyle, female    DOB: 10-Nov-1940, 82 y.o.   MRN: [id number]  HPI  Zoe Boyle is a 81 y/o female with a history of hyperlipidemia, HTN, CKD, anemia and chronic heart failure.   Echo report from 08/02/21 reviewed and showed an EF of 25-30% along with severe LAE, moderately elevated PA pressure and severe MR/TR.   Was in the ED 09/12/21 due to n/v/d due to viral illness where he was treated and released. Admitted 08/01/21 due to shortness of breath due to acute on chronic heart failure. Initially given IV lasix with transition to oral diuretics. Cardiology and palliative care consults obtained. Elevated troponin thought to be due to demand ischemia. Discharged after 3 days.   Zoe Boyle presents today for a follow-up visit with a chief complaint of moderate fatigue with minimal exertion. Describes this as chronic in nature. Zoe Boyle has associated shortness of breath, pedal edema and difficulty sleeping along with this. Zoe Boyle denies any abdominal distention, palpitations, chest pain, cough or dizziness.   Torsemide currently on hold because when Zoe Boyle was in the ED, Zoe Boyle was dehydrated and had worsening renal function. Labs have been checked at recent PCP office which showed improvement of renal function. Zoe Boyle has notice swelling in Zoe Boyle lower legs since Zoe Boyle diuretic has been held.   Hasn't been weighing herself as Zoe Boyle says that Zoe Boyle scales are broke.   Would like something to help increase Zoe Boyle appetite. Instructed that Zoe Boyle call Zoe Boyle PCP regarding this.   Past Medical History:  Diagnosis Date   Chronic combined systolic and diastolic CHF (congestive heart failure) (HCC)    CKD (chronic kidney disease), stage III (HCC)    Congestive heart failure (CHF) (HCC)    HLD (hyperlipidemia)    Hypertension    Normocytic anemia    Sepsis (HCC) 07/09/2021   No past surgical history on file. Family History  Family history unknown: Yes   Social History   Tobacco Use   Smoking status: Former   Smokeless  tobacco: Never  Substance Use Topics   Alcohol use: Not Currently   Allergies  Allergen Reactions   Lisinopril Swelling    TONGUE SWELLED   Prior to Admission medications   Medication Sig Start Date End Date Taking? Authorizing Provider  aspirin EC 81 MG tablet Take 1 tablet (81 mg total) by mouth daily. 08/18/21  Yes Jalen Daluz, Inetta Fermo A, FNP  azithromycin (ZITHROMAX Z-PAK) 250 MG tablet Take 2 tablets (500 mg) on  Day 1,  followed by 1 tablet (250 mg) once daily on Days 2 through 5. 09/12/21  Yes Sharman Cheek, MD  carvedilol (COREG) 6.25 MG tablet Take 1 tablet (6.25 mg total) by mouth 2 (two) times daily with a meal. 08/18/21  Yes Clarisa Kindred A, FNP  feeding supplement (ENSURE ENLIVE / ENSURE PLUS) LIQD Take 237 mLs by mouth 3 (three) times daily between meals. 08/04/21  Yes Enedina Finner, MD  mirtazapine (REMERON) 15 MG tablet Take 15 mg by mouth at bedtime. 07/21/21  Yes [provider]  Multiple Vitamin (MULTIVITAMIN WITH MINERALS) TABS tablet Take 1 tablet by mouth daily. 08/05/21  Yes Enedina Finner, MD  ondansetron (ZOFRAN-ODT) 4 MG disintegrating tablet Take 1 tablet (4 mg total) by mouth every 8 (eight) hours as needed for nausea or vomiting. 09/12/21  Yes Sharman Cheek, MD  pravastatin (PRAVACHOL) 20 MG tablet Take 1 tablet (20 mg total) by mouth daily. 08/18/21  Yes Clarisa Kindred A, FNP  spironolactone (ALDACTONE) 25 MG tablet  Take 0.5 tablets (12.5 mg total) by mouth daily. 08/18/21  Yes Arabel Barcenas A, FNP  Torsemide 40 MG TABS Take 40 mg by mouth daily. Patient not taking: Reported on 09/25/2021 08/18/21   Alisa Graff, FNP   Review of Systems  Constitutional:  Positive for fatigue (easily). Negative for appetite change.  HENT:  Negative for congestion, postnasal drip and sore throat.   Eyes: Negative.   Respiratory:  Positive for shortness of breath (easily). Negative for cough and chest tightness.   Cardiovascular:  Positive for leg swelling. Negative for chest pain and  palpitations.  Gastrointestinal:  Negative for abdominal distention and abdominal pain.  Endocrine: Negative.   Genitourinary: Negative.   Musculoskeletal:  Negative for back pain and neck pain.  Skin: Negative.   Allergic/Immunologic: Negative.   Neurological:  Negative for dizziness and light-headedness.  Hematological:  Negative for adenopathy. Does not bruise/bleed easily.  Psychiatric/Behavioral:  Positive for sleep disturbance (sleeping on 2 pillows). Negative for dysphoric mood. The patient is not nervous/anxious.    Vitals:   09/25/21 1300  BP: 91/76  Pulse: 85  Resp: 16  SpO2: 100%  Weight: 111 lb 2 oz (50.4 kg)  Height: 5' (1.524 m)   Wt Readings from Last 3 Encounters:  09/25/21 111 lb 2 oz (50.4 kg)  09/12/21 110 lb (49.9 kg)  08/18/21 108 lb (49 kg)   Lab Results  Component Value Date   CREATININE 2.06 (H) 09/12/2021   CREATININE 1.70 (H) 08/18/2021   CREATININE 1.45 (H) 08/04/2021   Physical Exam Vitals and nursing note reviewed. Exam conducted with a chaperone present (granddaughter).  Constitutional:      Appearance: Normal appearance.  HENT:     Head: Normocephalic and atraumatic.  Cardiovascular:     Rate and Rhythm: Normal rate and regular rhythm.  Pulmonary:     Effort: Pulmonary effort is normal. No respiratory distress.     Breath sounds: No wheezing or rales.  Abdominal:     General: There is no distension.     Palpations: Abdomen is soft.  Musculoskeletal:        General: No tenderness.     Cervical back: Normal range of motion and neck supple.     Right lower leg: Edema (trace pitting) present.     Left lower leg: Edema (trace pitting) present.  Skin:    General: Skin is warm and dry.  Neurological:     General: No focal deficit present.     Mental Status: Zoe Boyle is alert and oriented to person, place, and time.  Psychiatric:        Mood and Affect: Mood normal.        Behavior: Behavior normal.        Thought Content: Thought content  normal.    Assessment & Plan:  1: Chronic heart failure with reduced ejection fraction- - NYHA class III - euvolemic today - not weighing daily as Zoe Boyle scales are broke; set of scales provided today and Zoe Boyle was reminded to call for an overnight weight gain of > 2 pounds or a weekly weight gain of > 5 pounds - weight up 3 pounds from last visit here 5 weeks ago - on GDMT of carvedilol and spironolactone; consider adding SGLT2 - had tongue swelling with lisinopril - saw cardiology Clayborn Bigness) 09/19/20 - gift card provided today so that patient can go get compression socks and put them on every morning with removal at bedtime - since renal function has improved,  will resume torsemide at half dose which would be 20mg  daily - will recheck labs at next visit - BNP 09/12/21 was >4500  2: HTN with CKD- - BP low at 91/76 but Zoe Boyle's without dizziness - saw PCP (Tumey) 09/15/21 - BMP 09/15/21 reviewed and showed sodium 140, potassium 4.4, creatinine 1.9 & GFR 31  3: Anemia- - Hg 09/12/21 was 12.0   Medication list reviewed. Emphasized bring medication bottles to every visit.   Return in 1 month, sooner if needed.

## 2021-09-25 NOTE — Patient Instructions (Addendum)
Begin weighing daily and call for an overnight weight gain of 3 pounds or more or a weekly weight gain of more than 5 pounds.   If you have voicemail, please make sure your mailbox is cleaned out so that we may leave a message and please make sure to listen to any voicemails.    Use the gift card to get compression socks and put them on every morning with removal at bedtime.    Resume your fluid pill at 1/2 tablet every day.

## 2021-10-11 ENCOUNTER — Other Ambulatory Visit: Payer: Self-pay

## 2021-10-11 ENCOUNTER — Inpatient Hospital Stay
Admission: EM | Admit: 2021-10-11 | Discharge: 2021-10-20 | DRG: 291 | Disposition: A | Discharge status: Hospice | Payer: Medicare Other | Attending: Internal Medicine | Admitting: Internal Medicine

## 2021-10-11 ENCOUNTER — Emergency Department: Payer: Medicare Other

## 2021-10-11 DIAGNOSIS — I248 Other forms of acute ischemic heart disease: Secondary | ICD-10-CM | POA: Diagnosis present

## 2021-10-11 DIAGNOSIS — Z789 Other specified health status: Secondary | ICD-10-CM | POA: Diagnosis not present

## 2021-10-11 DIAGNOSIS — I440 Atrioventricular block, first degree: Secondary | ICD-10-CM | POA: Diagnosis not present

## 2021-10-11 DIAGNOSIS — I959 Hypotension, unspecified: Secondary | ICD-10-CM | POA: Diagnosis present

## 2021-10-11 DIAGNOSIS — E43 Unspecified severe protein-calorie malnutrition: Secondary | ICD-10-CM | POA: Diagnosis present

## 2021-10-11 DIAGNOSIS — N179 Acute kidney failure, unspecified: Secondary | ICD-10-CM | POA: Diagnosis present

## 2021-10-11 DIAGNOSIS — D631 Anemia in chronic kidney disease: Secondary | ICD-10-CM | POA: Diagnosis present

## 2021-10-11 DIAGNOSIS — I130 Hypertensive heart and chronic kidney disease with heart failure and stage 1 through stage 4 chronic kidney disease, or unspecified chronic kidney disease: Principal | ICD-10-CM | POA: Diagnosis present

## 2021-10-11 DIAGNOSIS — R54 Age-related physical debility: Secondary | ICD-10-CM | POA: Diagnosis present

## 2021-10-11 DIAGNOSIS — M109 Gout, unspecified: Secondary | ICD-10-CM | POA: Diagnosis present

## 2021-10-11 DIAGNOSIS — Z7189 Other specified counseling: Secondary | ICD-10-CM | POA: Diagnosis not present

## 2021-10-11 DIAGNOSIS — D72819 Decreased white blood cell count, unspecified: Secondary | ICD-10-CM | POA: Diagnosis present

## 2021-10-11 DIAGNOSIS — I10 Essential (primary) hypertension: Secondary | ICD-10-CM | POA: Diagnosis present

## 2021-10-11 DIAGNOSIS — I5043 Acute on chronic combined systolic (congestive) and diastolic (congestive) heart failure: Secondary | ICD-10-CM | POA: Diagnosis not present

## 2021-10-11 DIAGNOSIS — E875 Hyperkalemia: Secondary | ICD-10-CM | POA: Diagnosis not present

## 2021-10-11 DIAGNOSIS — Z20822 Contact with and (suspected) exposure to covid-19: Secondary | ICD-10-CM | POA: Diagnosis present

## 2021-10-11 DIAGNOSIS — K921 Melena: Secondary | ICD-10-CM | POA: Diagnosis not present

## 2021-10-11 DIAGNOSIS — T502X5A Adverse effect of carbonic-anhydrase inhibitors, benzothiadiazides and other diuretics, initial encounter: Secondary | ICD-10-CM | POA: Diagnosis present

## 2021-10-11 DIAGNOSIS — I447 Left bundle-branch block, unspecified: Secondary | ICD-10-CM | POA: Diagnosis present

## 2021-10-11 DIAGNOSIS — E872 Acidosis, unspecified: Secondary | ICD-10-CM | POA: Diagnosis present

## 2021-10-11 DIAGNOSIS — Z515 Encounter for palliative care: Secondary | ICD-10-CM

## 2021-10-11 DIAGNOSIS — R001 Bradycardia, unspecified: Secondary | ICD-10-CM | POA: Diagnosis not present

## 2021-10-11 DIAGNOSIS — Z87891 Personal history of nicotine dependence: Secondary | ICD-10-CM

## 2021-10-11 DIAGNOSIS — I5023 Acute on chronic systolic (congestive) heart failure: Secondary | ICD-10-CM | POA: Diagnosis present

## 2021-10-11 DIAGNOSIS — Z888 Allergy status to other drugs, medicaments and biological substances status: Secondary | ICD-10-CM

## 2021-10-11 DIAGNOSIS — J9601 Acute respiratory failure with hypoxia: Secondary | ICD-10-CM | POA: Diagnosis not present

## 2021-10-11 DIAGNOSIS — M25462 Effusion, left knee: Secondary | ICD-10-CM | POA: Diagnosis not present

## 2021-10-11 DIAGNOSIS — D696 Thrombocytopenia, unspecified: Secondary | ICD-10-CM | POA: Diagnosis present

## 2021-10-11 DIAGNOSIS — I471 Supraventricular tachycardia: Secondary | ICD-10-CM | POA: Diagnosis not present

## 2021-10-11 DIAGNOSIS — I081 Rheumatic disorders of both mitral and tricuspid valves: Secondary | ICD-10-CM | POA: Diagnosis present

## 2021-10-11 DIAGNOSIS — N1832 Chronic kidney disease, stage 3b: Secondary | ICD-10-CM | POA: Diagnosis present

## 2021-10-11 DIAGNOSIS — Z7982 Long term (current) use of aspirin: Secondary | ICD-10-CM

## 2021-10-11 DIAGNOSIS — J9811 Atelectasis: Secondary | ICD-10-CM | POA: Diagnosis present

## 2021-10-11 DIAGNOSIS — R778 Other specified abnormalities of plasma proteins: Secondary | ICD-10-CM | POA: Insufficient documentation

## 2021-10-11 DIAGNOSIS — I509 Heart failure, unspecified: Principal | ICD-10-CM

## 2021-10-11 DIAGNOSIS — Z66 Do not resuscitate: Secondary | ICD-10-CM | POA: Diagnosis not present

## 2021-10-11 DIAGNOSIS — E876 Hypokalemia: Secondary | ICD-10-CM | POA: Diagnosis present

## 2021-10-11 DIAGNOSIS — F32A Depression, unspecified: Secondary | ICD-10-CM | POA: Diagnosis present

## 2021-10-11 DIAGNOSIS — M25562 Pain in left knee: Secondary | ICD-10-CM | POA: Diagnosis not present

## 2021-10-11 DIAGNOSIS — E785 Hyperlipidemia, unspecified: Secondary | ICD-10-CM | POA: Diagnosis present

## 2021-10-11 DIAGNOSIS — I420 Dilated cardiomyopathy: Secondary | ICD-10-CM | POA: Diagnosis present

## 2021-10-11 DIAGNOSIS — N1831 Chronic kidney disease, stage 3a: Secondary | ICD-10-CM | POA: Diagnosis not present

## 2021-10-11 DIAGNOSIS — Z79899 Other long term (current) drug therapy: Secondary | ICD-10-CM

## 2021-10-11 LAB — CBC
HCT: 36.4 % (ref 36.0–46.0)
Hemoglobin: 12 g/dL (ref 12.0–15.0)
MCH: 26.5 pg (ref 26.0–34.0)
MCHC: 33 g/dL (ref 30.0–36.0)
MCV: 80.5 fL (ref 80.0–100.0)
Platelets: 86 10*3/uL — ABNORMAL LOW (ref 150–400)
RBC: 4.52 MIL/uL (ref 3.87–5.11)
RDW: 17 % — ABNORMAL HIGH (ref 11.5–15.5)
WBC: 4.7 10*3/uL (ref 4.0–10.5)
nRBC: 1.5 % — ABNORMAL HIGH (ref 0.0–0.2)

## 2021-10-11 LAB — BASIC METABOLIC PANEL
Anion gap: 13 (ref 5–15)
BUN: 53 mg/dL — ABNORMAL HIGH (ref 8–23)
CO2: 18 mmol/L — ABNORMAL LOW (ref 22–32)
Calcium: 8.9 mg/dL (ref 8.9–10.3)
Chloride: 106 mmol/L (ref 98–111)
Creatinine, Ser: 1.72 mg/dL — ABNORMAL HIGH (ref 0.44–1.00)
GFR, Estimated: 30 mL/min — ABNORMAL LOW (ref >60)
Glucose, Bld: 160 mg/dL — ABNORMAL HIGH (ref 70–99)
Potassium: 3.4 mmol/L — ABNORMAL LOW (ref 3.5–5.1)
Sodium: 137 mmol/L (ref 135–145)

## 2021-10-11 LAB — TROPONIN I (HIGH SENSITIVITY)
Troponin I (High Sensitivity): 46 ng/L — ABNORMAL HIGH (ref <18)
Troponin I (High Sensitivity): 40 ng/L — ABNORMAL HIGH (ref <18)

## 2021-10-11 MED ORDER — FUROSEMIDE 10 MG/ML IJ SOLN
40.0000 mg | Freq: Once | INTRAMUSCULAR | Status: AC
  Administered 2021-10-11: 40 mg via INTRAVENOUS
  Filled 2021-10-11: qty 4

## 2021-10-11 NOTE — ED Notes (Signed)
Leona Carry (pts daughter) called and given update on pts status and pending admission.

## 2021-10-11 NOTE — Hospital Course (Signed)
CHF with EF of 25 to 30%, hypertension, hyperlipidemia, CKD-3A, anemia  Zoe Boyle is a 81 y.o. female with medical history significant of CHF with EF of 25 to 30%, hypertension, hyperlipidemia, CKD-3A, anemia, who presents with shortness of breath.  Patient was recently hospitalized from 6/11 - 6/15 due to group A streptococcus bacteremia.  Patient was treated with IV antibiotics and discharged on amoxicillin.  Patient finished a course of amoxicillin.   Chest x-ray showed vascular congestion and cardiomegaly   Acute on chronic combined systolic and diastolic congestive heart failure (HCC) --2D echo on 07/10/2021 showed EF of 25-30% with grade 1 diastolic dysfunction.   --Patient has trace leg edema, positive JVD, BNP> 4500, vascular congestion by chest x-ray, clinically consistent with CHF exacerbation. --Borderline blood pressure. --Cardiology was consulted--increased dose of lasix and low dose coreg  --UOP ~2700 cc -Low salt diet -pt overall feels better. Ambulating by herself   Elevated troponin suspect demand ischemia --Myocardial injury due to CHF exacerbation. Troponin level 87 --> 90>>109, denies chest pain --A1c of 5.2 -Trend troponin and lipid panel with LDL of 111. -Aspirin and Pravastatin   Acute renal failure superimposed on stage 3a chronic kidney disease (HCC) Baseline creatinine 0.83 on 07/13/2021.  Her creatinine is 1.95>>2.09, BUN 63, likely due to cardiorenal syndrome.>1.4 -Patient is on IV Lasix--change to po torsemide --resume spironolactone at d/c    Hypertension - IV hydralazine as needed -Continue  Coreg. --bp soft--d/ced hydralazine   Anemia of chronic disease Hemoglobin stable 10.2 (9.1 07/13/2021)   Protein-calorie malnutrition, moderate (HCC) Body weight 59 kg, BMI 25.40 -Start Ensure -Dietitian consult noted   HLD (hyperlipidemia) - Pravastatin   Depression -Continue remeron   Overall improving HHRN for CHF D/c home

## 2021-10-11 NOTE — ED Triage Notes (Signed)
Pt to ED via POV from home. Family member states pt has been having increased SOB and bilateral leg swelling. Pt denies CP.

## 2021-10-11 NOTE — Assessment & Plan Note (Addendum)
Metabolic Acidosis - due to renal failure. Hyperkalemia - resolved, recurrent 9/21 K 5.4. --renal function improved on Lasix drip --Nephrology consulted

## 2021-10-11 NOTE — ED Notes (Signed)
Leona Carry (pt's daughter) would like to be notified with any changes.  [phone]

## 2021-10-11 NOTE — ED Notes (Signed)
Patient ambulatory to restroom with stand by assist.

## 2021-10-11 NOTE — Assessment & Plan Note (Addendum)
Hold antihypertensive medications to prevent hypotension.  

## 2021-10-11 NOTE — Assessment & Plan Note (Addendum)
Leukopenia.  Follow up hgb is 13, with plt at 120. Wbc is 3,8 Hold on aspirin.   Patient had a bloody bowel movement this am, she has history of hemorrhoids Hold aspirin for now and follow up on cell count this pm.

## 2021-10-11 NOTE — Assessment & Plan Note (Signed)
-   Continue mirtazapine 

## 2021-10-11 NOTE — ED Notes (Signed)
EKG reading "suspect arm lead reversal" this RN checking leads in real time and leads are no reversed. MD made aware.

## 2021-10-11 NOTE — Assessment & Plan Note (Addendum)
Troponin of 40 likely secondary to demand ischemia Continue to trend Continue and Pravastatin

## 2021-10-11 NOTE — ED Provider Notes (Signed)
[facility] Provider Note    Event Date/Time   First MD Initiated Contact with Patient 10/11/21 1809     (approximate)   History   Chief Complaint Shortness of Breath   HPI  Zoe Boyle is a 81 y.o. female with past medical history of hypertension, hyperlipidemia, CKD, and CHF who presents to the ED complaining of shortness of breath.  Patient reports that she has been feeling increasingly short of breath over the past 3 to 4 days with associated swelling in her legs.  She denies any fevers, cough, or pain in her chest.  She states she has been taking her diuretic medication as prescribed, denies any recent changes in her diuretic regimen.     Physical Exam   Triage Vital Signs: ED Triage Vitals  Enc Vitals Group     BP 10/11/21 1607 99/77     Pulse Rate 10/11/21 1607 65     Resp 10/11/21 1607 18     Temp 10/11/21 1606 98.6 F (37 C)     Temp Source 10/11/21 1606 Oral     SpO2 10/11/21 1607 100 %     Weight 10/11/21 1607 111 lb 1.8 oz (50.4 kg)     Height 10/11/21 1607 5' (1.524 m)     Head Circumference --      Peak Flow --      Pain Score --      Pain Loc --      Pain Edu? --      Excl. in GC? --     Most recent vital signs: Vitals:   10/11/21 2316 10/11/21 2317  BP:  115/88  Pulse:  80  Resp:  (!) 28  Temp: 98 F (36.7 C)   SpO2:  96%    Constitutional: Alert and oriented. Eyes: Conjunctivae are normal. Head: Atraumatic. Nose: No congestion/rhinnorhea. Mouth/Throat: Mucous membranes are moist.  Cardiovascular: Normal rate, regular rhythm. Grossly normal heart sounds.  2+ radial pulses bilaterally. Respiratory: Normal respiratory effort.  No retractions. Lungs CTAB. Gastrointestinal: Soft and nontender. No distention. Musculoskeletal: No lower extremity tenderness, 2+ pitting edema to knees bilaterally. Neurologic:  Normal speech and language. No gross focal neurologic deficits are appreciated.    ED Results / Procedures /  Treatments   Labs (all labs ordered are listed, but only abnormal results are displayed) Labs Reviewed  BASIC METABOLIC PANEL - Abnormal; Notable for the following components:      Result Value   Potassium 3.4 (*)    CO2 18 (*)    Glucose, Bld 160 (*)    BUN 53 (*)    Creatinine, Ser 1.72 (*)    GFR, Estimated 30 (*)    All other components within normal limits  CBC - Abnormal; Notable for the following components:   RDW 17.0 (*)    Platelets 86 (*)    nRBC 1.5 (*)    All other components within normal limits  TROPONIN I (HIGH SENSITIVITY) - Abnormal; Notable for the following components:   Troponin I (High Sensitivity) 46 (*)    All other components within normal limits  TROPONIN I (HIGH SENSITIVITY) - Abnormal; Notable for the following components:   Troponin I (High Sensitivity) 40 (*)    All other components within normal limits     EKG  ED ECG REPORT I, Chesley Noon, the attending physician, personally viewed and interpreted this ECG.   Date: 10/11/2021  EKG Time: 16:14  Rate: 112  Rhythm: normal  sinus rhythm, occasional PVC noted, unifocal  Axis: Normal  Intervals:left bundle branch block  ST&T Change: None  RADIOLOGY Chest x-ray reviewed and interpreted by me with significant cardiomegaly and mild edema noted, no focal infiltrate.  PROCEDURES:  Critical Care performed: No  Procedures   MEDICATIONS ORDERED IN ED: Medications  furosemide (LASIX) injection 40 mg (40 mg Intravenous Given 10/11/21 1938)     IMPRESSION / MDM / ASSESSMENT AND PLAN / ED COURSE  I reviewed the triage vital signs and the nursing notes.                              81 y.o. female with past medical history of hypertension, hyperlipidemia, CKD, and CHF presents to the ED with 3 to 4 days of increasing difficulty breathing associated with leg swelling.  Patient's presentation is most consistent with acute presentation with potential threat to life or bodily  function.  Differential diagnosis includes, but is not limited to, ACS, PE, pneumonia, CHF, COPD, AKI, electrolyte abnormality.  Patient nontoxic-appearing and in no acute distress, vital signs remarkable for tachypnea however she is maintaining oxygen saturations on room air.  Patient does appear fluid overloaded with lower extremity edema, chest x-ray shows marked cardiomegaly with associated edema, no focal infiltrate noted.  EKG shows no ischemic changes and labs are reassuring with troponin mildly elevated but stable on recheck, elevation likely secondary to her stable chronic kidney disease.  CBC with no significant anemia or leukocytosis.  Given marked cardiomegaly, bedside echocardiogram was performed and shows depressed EF but no significant pericardial effusion.  Symptoms appear due to CHF exacerbation and patient was diuresed with IV Lasix.  She continues to feel short of breath, maintain oxygen saturations with ambulation but became significantly tachypneic to greater than 30.  Case discussed with hospitalist for admission.      FINAL CLINICAL IMPRESSION(S) / ED DIAGNOSES   Final diagnoses:  Acute on chronic congestive heart failure, unspecified heart failure type (HCC)     Rx / DC Orders   ED Discharge Orders     None        Note:  This document was prepared using Dragon voice recognition software and may include unintentional dictation errors.   Chesley Noon, MD 10/12/21 [phone]

## 2021-10-11 NOTE — ED Notes (Signed)
Pt ambulated to restroom at this time from hallway bed with standby assistance as needed.  Pt's O2 sats remained stable, with lowest O2 sat found to be 95 on RA.  Pt is noted to be tachypneic

## 2021-10-11 NOTE — Assessment & Plan Note (Deleted)
Presents with dyspnea on exertion, lower extremity edema, BNP pending 2D echo on 07/10/2021 showed EF of 25-30% with grade 1 diastolic dysfunction.   Patient has trace leg edema, positive JVD, vascular congestion and cardiomegaly on chest x-ray, clinically consistent with CHF exacerbation. Borderline blood pressure. IV Lasix.  Continue Coreg.  Hold spironolactone due to soft blood pressures Daily weights with intake and output monitoring

## 2021-10-12 ENCOUNTER — Other Ambulatory Visit: Payer: Self-pay

## 2021-10-12 DIAGNOSIS — I5043 Acute on chronic combined systolic (congestive) and diastolic (congestive) heart failure: Secondary | ICD-10-CM

## 2021-10-12 LAB — CBC
HCT: 35.9 % — ABNORMAL LOW (ref 36.0–46.0)
Hemoglobin: 12.2 g/dL (ref 12.0–15.0)
MCH: 26.9 pg (ref 26.0–34.0)
MCHC: 34 g/dL (ref 30.0–36.0)
MCV: 79.2 fL — ABNORMAL LOW (ref 80.0–100.0)
Platelets: 80 10*3/uL — ABNORMAL LOW (ref 150–400)
RBC: 4.53 MIL/uL (ref 3.87–5.11)
RDW: 16.8 % — ABNORMAL HIGH (ref 11.5–15.5)
WBC: 4.7 10*3/uL (ref 4.0–10.5)
nRBC: 0.8 % — ABNORMAL HIGH (ref 0.0–0.2)

## 2021-10-12 LAB — CREATININE, SERUM
Creatinine, Ser: 1.62 mg/dL — ABNORMAL HIGH (ref 0.44–1.00)
GFR, Estimated: 32 mL/min — ABNORMAL LOW (ref >60)

## 2021-10-12 LAB — POTASSIUM: Potassium: 3 mmol/L — ABNORMAL LOW (ref 3.5–5.1)

## 2021-10-12 MED ORDER — FUROSEMIDE 10 MG/ML IJ SOLN
40.0000 mg | Freq: Two times a day (BID) | INTRAMUSCULAR | Status: DC
  Administered 2021-10-12 (×2): 40 mg via INTRAVENOUS
  Filled 2021-10-12 (×2): qty 4

## 2021-10-12 MED ORDER — PRAVASTATIN SODIUM 20 MG PO TABS
20.0000 mg | ORAL_TABLET | Freq: Every day | ORAL | Status: DC
  Administered 2021-10-12 – 2021-10-20 (×9): 20 mg via ORAL
  Filled 2021-10-12 (×9): qty 1

## 2021-10-12 MED ORDER — ONDANSETRON HCL 4 MG PO TABS: 4.0000 mg | ORAL_TABLET | Freq: Four times a day (QID) | ORAL | Status: DC | PRN

## 2021-10-12 MED ORDER — ONDANSETRON HCL 4 MG/2ML IJ SOLN
4.0000 mg | Freq: Four times a day (QID) | INTRAMUSCULAR | Status: DC | PRN
  Administered 2021-10-14: 4 mg via INTRAVENOUS
  Filled 2021-10-12: qty 2

## 2021-10-12 MED ORDER — ACETAMINOPHEN 325 MG PO TABS: 650.0000 mg | ORAL_TABLET | Freq: Four times a day (QID) | ORAL | Status: DC | PRN

## 2021-10-12 MED ORDER — POTASSIUM CHLORIDE CRYS ER 20 MEQ PO TBCR
40.0000 meq | EXTENDED_RELEASE_TABLET | ORAL | Status: AC
  Administered 2021-10-12 (×2): 40 meq via ORAL
  Filled 2021-10-12 (×2): qty 2

## 2021-10-12 MED ORDER — CARVEDILOL 6.25 MG PO TABS
6.2500 mg | ORAL_TABLET | Freq: Two times a day (BID) | ORAL | Status: DC
  Administered 2021-10-12 – 2021-10-13 (×3): 6.25 mg via ORAL
  Filled 2021-10-12 (×3): qty 1

## 2021-10-12 MED ORDER — ENOXAPARIN SODIUM 30 MG/0.3ML IJ SOSY
30.0000 mg | PREFILLED_SYRINGE | INTRAMUSCULAR | Status: DC
  Administered 2021-10-12 – 2021-10-16 (×5): 30 mg via SUBCUTANEOUS
  Filled 2021-10-12 (×5): qty 0.3

## 2021-10-12 MED ORDER — MIRTAZAPINE 15 MG PO TABS
15.0000 mg | ORAL_TABLET | Freq: Every day | ORAL | Status: DC
  Administered 2021-10-12 – 2021-10-19 (×8): 15 mg via ORAL
  Filled 2021-10-12 (×8): qty 1

## 2021-10-12 MED ORDER — ASPIRIN 81 MG PO TBEC
81.0000 mg | DELAYED_RELEASE_TABLET | Freq: Every day | ORAL | Status: DC
Start: 2021-10-12 — End: 2021-10-16
  Administered 2021-10-12 – 2021-10-15 (×4): 81 mg via ORAL
  Filled 2021-10-12 (×4): qty 1

## 2021-10-12 MED ORDER — ADULT MULTIVITAMIN W/MINERALS CH
1.0000 | ORAL_TABLET | Freq: Every day | ORAL | Status: DC
  Administered 2021-10-12 – 2021-10-20 (×9): 1 via ORAL
  Filled 2021-10-12 (×9): qty 1

## 2021-10-12 MED ORDER — ENSURE ENLIVE PO LIQD
237.0000 mL | Freq: Three times a day (TID) | ORAL | Status: DC
  Administered 2021-10-12 – 2021-10-16 (×11): 237 mL via ORAL

## 2021-10-12 MED ORDER — ACETAMINOPHEN 650 MG RE SUPP: 650.0000 mg | Freq: Four times a day (QID) | RECTAL | Status: DC | PRN

## 2021-10-12 NOTE — Progress Notes (Signed)
Visited patient to review heart failure education booklet, including information about daily weights, low sodium diet, and strict monitoring of fluid intake.  Patient already known to heart failure clinic. Patient states she does not use any salt and doesn't believe she drinks too much, although she's not sure how much she is currently drinking. Patient given verbal and written information about post hospital follow up appointment with [facility] on 9/26. Patient denies any barriers to making this appointment.  Suanne Marker, RN

## 2021-10-12 NOTE — Discharge Instructions (Signed)
Heart Failure Nutrition Therapy For The Undernourished  This nutrition therapy will help you eat more calories and protein in addition to helping your heart. These suggestions can help you if you can't eat enough, have lost weight, or need extra calories and protein in your diet. This plan focuses on: Eating more calories.  Eating more calories can give you more energy, help you gain weight, and promote wound healing. Eating more protein. Protein can help you heal, give you energy, and build and repair muscle. Ask your registered dietitian nutritionist (RDN) how much protein is the right amount for you. Eating a low-sodium diet while increasing your calorie and protein intake. This will help control buildup of fluids around your heart, stomach, lungs, and legs. Too much sodium may make your blood pressure too high and put stress on your heart. You can achieve these goals by: Eating more calories and protein. Eating less than 2,000 milligrams of sodium per day. Reading food labels to keep track of how much sodium, protein, and calories are in the foods you eat. Limiting fluid intake if your doctor has asked you to follow a fluid restriction. Reading the Food Label: How Much Sodium Is too Much? The nutrition plan for heart failure usually limits the sodium that you get from food and beverages to 2,000 milligrams per day. Salt is the main source of sodium. Read the nutrition label to find out how much sodium is in 1 serving. Foods with more than 300 milligrams of sodium per serving may not fit into a reduced-sodium meal plan. Check serving sizes on the label. If you eat more than 1 serving, you will get more sodium than the amount listed. You can find protein and calories on the Nutrition Facts label too. Eating More Protein and Calories Eat at least 6 small meals throughout the day. If you become full quickly after you begin eating, you may benefit from small, frequent meals instead of 3 large  meals. Keep high-calorie and high-protein snacks available in your car or bag for when you get hungry. Add extra calories and protein when cooking meals. Cook with unsalted butter, margarine, or oils instead of calorie-free cooking spray. Use reduced-fat (2%) milk instead of fat-free (skim) milk. Ask your RDN if whole milk is recommended for you. Add milk powder to protein shakes, cereal, or casseroles. Sprinkle unsalted nuts and seeds into your cereal, stir-fry, or salad. Add 1 tablespoon mayonnaise, sugar, or honey to foods (adds 50 to 100 calories). Add calories and protein to your fruits and vegetables. Fruits are low in calories. Add peanut butter, yogurt, or cottage cheese to add calories and protein. Buy fruit cups canned in syrup instead of water or juice. Vegetables are also low in calories. Add butter, sour cream, margarine, or oil for extra calories. Potatoes, corn, and peas are higher in calories than nonstarchy vegetables. Avocados are rich in calories and healthy fat. Eat them alone or use them as a topping or spread. Limit foods that have little to no nutrition. Eat fewer calorie-free and low-calorie foods such as applesauce, Jell-O, and diet soft drinks. These foods will take up space in your stomach and may leave little room for higher-calorie foods and beverages.  When shopping, avoid products that say "low calorie" or "low fat." Drink high-calorie beverages. There are several liquid nutrition supplements available that also have less than 300 milligrams of sodium per serving. Drink them between meals as snacks.  Make your own supplement at home with milk or ice   cream.  Ask your RDN if protein powder would also be a good addition. Milk and juice are high in calories. Limit plain tea, coffee, and water. These have no calories.  Foods Recommended Food Group Foods Recommended  Grains Whole wheat bread with less than 80 milligrams of sodium per slice  Many cold cereals,  especially shredded wheat and granola Oats or cream of wheat made with milk (add butter or margarine, sugar, dried fruit, and nuts for more calories and fat) Quinoa Wheat germ (sprinkle on soups, baked goods, or cereal)  Vegetables Fresh and frozen vegetables without added sauces, salt, or sodium Homemade soups (salt free or low sodium) with dry milk powder or cream Potatoes, corn, and peas  Fruits Canned fruits (canned in heavy syrup) Dried fruits, such as raisins, cranberries, and prunes Avocados  Dairy (Milk and Milk Products) Milk or milk powder Soy milk Yogurt, including Greek yogurt Small amounts of natural, blocked cheeses or reduced-sodium cheese (Swiss, ricotta, and fresh mozzarella are lower in sodium than others) Regular or soft cream cheese and low-sodium cottage cheese  Protein Foods (Meat, Poultry, Fish, and Beans) Fresh meat and fish Turkey bacon (check the Nutrition Facts label to make sure its not packaged in a sodium solution) Tuna canned or packed in oil Dried beans, peas, and legumes; edamame (fresh soybeans) Eggs Unsalted nuts or peanut butter  Desserts and Snacks Apple (with peanut butter); baked apple with sugar and butter             Granola bars Pudding made with reduced-fat (2%) milk or milk powder Smoothies  Custard Chocolate syrup added to milk or ice cream  Fats Tub or liquid margarine (trans fat-free) Butter (check with your doctor or RDN first) Unsaturated fat oils (canola, olive, corn, sunflower, safflower, peanut, vegetable, and soybean) Flaxseed (oil or ground)  Sodium-Free Condiments Fresh or dried herbs; pepper; vinegar; lemon juice or lime juice; salt-free seasoning mixes and marinades such as Mrs. Dash or McCormick's salt-free blend; simple salad dressings such as vinegar and oil; low-sodium ketchup. You can purchase salt-free barbecue sauce and many others on the Internet.  Ask your RDN.     Foods Not Recommended Food Group Foods Not  Recommended  Grains Breads or crackers topped with salt Cereals (hot/cold) with more than 300 milligrams sodium per serving Biscuits, cornbread, and other "quick" breads prepared with baking soda Prepackaged bread crumbs Self-rising flours  Vegetables Canned vegetables (unless they are salt free or low sodium) Frozen vegetables with high-sodium seasoning and sauces Sauerkraut and pickled vegetables Canned or dried soups (unless they are salt free or low  sodium) French fries and onion rings Broth-based soups  Fruits Dried fruits preserved with sodium-containing additives  Dairy (Milk and Milk Products) Buttermilk Processed cheeses such as Cheese Wiz, Velveeta, and Queso Feta cheese Shredded cheese (has more sodium than blocked cheeses) "Singles" cheese slices and string cheese  Protein Foods (Meat, Poultry, Fish, and Beans) Cured meats (bacon, ham, sausage, pepperoni, and hot dogs)  Canned meats (chili, Vienna sausage, sardines, and Spam) Smoked fish and meats Frozen meals with more than 600 milligrams of sodium Egg Beaters   Fats Salted butter or margarine  Condiments Salt, sea salt, kosher salt, onion salt, and garlic salt Seasoning mixes containing salt such as Lemon Pepper or Lawry's Bouillon cubes Catsup or ketchup Barbecue sauce and Worcestershire sauce Soy sauce Salsa, pickles, olives, relish Salad dressings: ranch, blue cheese, Italian, and French  Alcohol Check with your doctor.   Heart Failure (  Undernourished) Sample 1-Day Menu View Nutrient Info Breakfast 1 cup oatmeal made with milk 1 tablespoon wheat germ (sprinkle on oatmeal) 1 cup (240 milliliters) reduced-fat (2%) milk 2 tablespoons chocolate syrup (add to milk) 1 banana 2 tablespoons peanut butter (for banana)  Morning Snack 1/4 cup raw almonds Cranberries (add to almonds) 1 cup (240 milliliters) oral nutritional supplement  Lunch 3 ounces grilled chicken breast 1 small potato 2 ounces cheese (to add to  potato) 2 tablespoons margarine (to add to potato) Croutons (add to salad) Walnuts (add to salad) Vinegar and oil dressing (add to salad) 1 cup (240 milliliters) juice  Afternoon Snack 10 tortilla chips Guacamole (avocado, lime juice, onion, and tomatoes, and pepper) 1 cup (240 milliliters) water or juice  Evening Meal 3 ounces herb-baked fish 1 cup homemade mashed potatoes with margarine (trans fat-free) and milk 1/2 cup creamed spinach 1 cup (240 milliliters) water or juice  Evening Snack 1 cup (240 milliliters) homemade milkshake 1 slice low-sodium turkey breast 1 slice whole wheat bread 1 slice cheese Mayonnaise  Daily Sum Nutrient Unit Value  Macronutrients  Energy kcal 3461  Energy kJ 14480  Protein g 151  Total lipid (fat) g 158  Carbohydrate, by difference g 381  Fiber, total dietary g 36  Sugars, total g 163  Minerals  Calcium, Ca mg 2598  Iron, Fe mg 32  Sodium, Na mg 2530  Vitamins  Vitamin C, total ascorbic acid mg 180  Vitamin A, IU IU 9009  Vitamin D IU 575  Lipids  Fatty acids, total saturated g 43  Fatty acids, total monounsaturated g 56  Fatty acids, total polyunsaturated g 48  Cholesterol mg 252     Heart Failure (Undernourished) Vegan Sample 1-Day Menu View Nutrient Info Breakfast 1 cup oatmeal made with: 1 cup soymilk fortified with calcium, vitamin B12, and vitamin D 2 tablespoons walnuts, unsalted 1 banana 1 cup orange juice with added calcium and vitamin D  Morning Snack  cup almond butter, unsalted 1 apple  cup granola  cup soy yogurt  Lunch 1 black bean burger, low sodium 1 hamburger bun 1 cup lettuce for salad with:  cup cucumbers, sliced  cup carrots, shredded 1 tablespoon cashews, unsalted 1 tablespoon sesame seed dressing, low sodium  cup pineapple  Afternoon Snack 1 pita, whole wheat  cup hummus  cup grapes  Evening Meal 1 cup cooked rice 1 cup red beans, cooked 1 cup corn, cooked  cup cucumber slices  cup  tomato, diced  Evening Snack Smoothie made with: 1 cup soymilk fortified with calcium, vitamin B12, and vitamin D 1 scoop soy protein powder, for smoothie 1 cup strawberries, for smoothie  Daily Sum Nutrient Unit Value  Macronutrients  Energy kcal 2831  Energy kJ 11850  Protein g 121  Total lipid (fat) g 85  Carbohydrate, by difference g 428  Fiber, total dietary g 71  Sugars, total g 154  Minerals  Calcium, Ca mg 2162  Iron, Fe mg 45  Sodium, Na mg 2054  Vitamins  Vitamin C, total ascorbic acid mg 296  Vitamin A, IU IU 17869  Vitamin D IU 318  Lipids  Fatty acids, total saturated g 9  Fatty acids, total monounsaturated g 36  Fatty acids, total polyunsaturated g 30  Cholesterol mg 0     Heart Failure (Undernourished) Vegetarian (Lacto-Ovo) Sample 1-Day Menu View Nutrient Info Breakfast 1 cup oatmeal made with: 1 cup 2% milk 1 slice toast, whole wheat topped with:  cup   avocado 1 cup orange juice with added calcium and vitamin D  Morning Snack  cup almonds, unsalted 2 tablespoons dried cranberries (add to almonds) 1 cup Greek yogurt, plain  Lunch Sandwich made with: 2 slices bread, whole wheat 2 tablespoons peanut butter, unsalted 1 tablespoon jam 4 carrot sticks with: 2 tablespoons hummus 1 banana  Afternoon Snack  cup granola  cup peanuts, unsalted 1 apple  Evening Meal 1 cup black bean soup, low sodium  cup tofu, cooked  cup cooked brown rice  cup broccoli, cooked  cup carrots, cooked  cup onions, cooked 2 tablespoons peanut oil  Evening Snack 5 crackers, whole wheat, low sodium 3 dried figs 1 cup 2% milk  Daily Sum Nutrient Unit Value  Macronutrients  Energy kcal 3020  Energy kJ 12636  Protein g 117  Total lipid (fat) g 133  Carbohydrate, by difference g 375  Fiber, total dietary g 58  Sugars, total g 148  Minerals  Calcium, Ca mg 2197  Iron, Fe mg 29  Sodium, Na mg 1434  Vitamins  Vitamin C, total ascorbic acid mg 174  Vitamin A, IU  IU 20295  Vitamin D IU 317  Lipids  Fatty acids, total saturated g 27  Fatty acids, total monounsaturated g 62  Fatty acids, total polyunsaturated g 34  Cholesterol mg 63    Copyright 2020  [employer]. All rights reserved  

## 2021-10-12 NOTE — Plan of Care (Signed)
  Problem: Education: Goal: Ability to demonstrate management of disease process will improve Outcome: Progressing Goal: Ability to verbalize understanding of medication therapies will improve Outcome: Progressing   Problem: Activity: Goal: Capacity to carry out activities will improve Outcome: Progressing   Problem: Cardiac: Goal: Ability to achieve and maintain adequate cardiopulmonary perfusion will improve Outcome: Progressing   Problem: Education: Goal: Ability to demonstrate management of disease process will improve Outcome: Progressing Goal: Ability to verbalize understanding of medication therapies will improve Outcome: Progressing Goal: Individualized Educational Video(s) Outcome: Adequate for Discharge   Problem: Activity: Goal: Capacity to carry out activities will improve Outcome: Progressing   Problem: Cardiac: Goal: Ability to achieve and maintain adequate cardiopulmonary perfusion will improve Outcome: Progressing

## 2021-10-12 NOTE — Progress Notes (Signed)
Anticoagulation monitoring(Lovenox):  81 yo female ordered Lovenox 40 mg Q24h    Filed Weights   10/11/21 1607  Weight: 50.4 kg (111 lb 1.8 oz)   BMI 21.7   Lab Results  Component Value Date   CREATININE 1.72 (H) 10/11/2021   CREATININE 2.06 (H) 09/12/2021   CREATININE 1.70 (H) 08/18/2021   Estimated Creatinine Clearance: 19.1 mL/min (A) (by C-G formula based on SCr of 1.72 mg/dL (H)). Hemoglobin & Hematocrit     Component Value Date/Time   HGB 12.0 10/11/2021 1712   HCT 36.4 10/11/2021 1712     Per Protocol for Patient with estCrcl < 30 ml/min and BMI < 40, will transition to Lovenox 30 mg Q24h.

## 2021-10-12 NOTE — H&P (Signed)
History and Physical    Patient: Zoe Boyle [id number] DOB: 1940/09/11 DOA: 10/11/2021 DOS: the patient was seen and examined on 10/12/2021 PCP: Gladstone Lighter, MD  Patient coming from: Home  Chief Complaint:  Chief Complaint  Patient presents with   Shortness of Breath   HPI: Zoe Boyle is a 81 y.o. female with medical history significant of Systolic CHF (EF 25 to [id number] June 2023), HTN, HLD, CKD 3A, anemia, depression, hospitalized back in June from 6/11 to 6/15 with group A strep bacteremia and then in July with CHF exacerbation who presents to the ED for evaluation of a 3 to 4-day history of shortness of breath associated with lower extremity edema in spite of compliance with her home diuretic regimen.  She also reports adherence to her diet.  She denies chest pain, cough, fever or chills. ED course and data review: On arrival BP was low at 99/77 and she was tachypneic from 24-32 with normal O2 sat on room air. Labs with troponin of 40, no BNP ordered.  Platelets noted to be low at 86,000 down from a baseline of 140,000.  Creatinine at baseline at 1.72 but with mild metabolic acidosis with bicarb of 18.  EKG, personally viewed and interpreted showing undetermined rhythm at 112.  Chest x-ray showed the following IMPRESSION: Marked cardiac enlargement which may be due to cardiomyopathy or pericardial effusion Mild bibasilar atelectasis/infiltrate Mild vascular congestion without edema  Patient given a dose of Lasix.  Hospitalist consulted for admission.   Review of Systems: As mentioned in the history of present illness. All other systems reviewed and are negative. Past Medical History:  Diagnosis Date   Chronic combined systolic and diastolic CHF (congestive heart failure) (HCC)    CKD (chronic kidney disease), stage III (HCC)    Congestive heart failure (CHF) (HCC)    HLD (hyperlipidemia)    Hypertension    Normocytic anemia    Sepsis ([place]) 07/09/2021   History reviewed. No  pertinent surgical history. Social History:  reports that she has quit smoking. She has never used smokeless tobacco. She reports that she does not currently use alcohol. She reports that she does not currently use drugs.  Allergies  Allergen Reactions   Lisinopril Swelling    TONGUE SWELLED    Family History  Family history unknown: Yes    Prior to Admission medications   Medication Sig Start Date End Date Taking? Authorizing Provider  aspirin EC 81 MG tablet Take 1 tablet (81 mg total) by mouth daily. 08/18/21  Yes Darylene Price A, FNP  carvedilol (COREG) 6.25 MG tablet Take 1 tablet (6.25 mg total) by mouth 2 (two) times daily with a meal. 08/18/21  Yes Hackney, Otila Kluver A, FNP  feeding supplement (ENSURE ENLIVE / ENSURE PLUS) LIQD Take 237 mLs by mouth 3 (three) times daily between meals. 08/04/21  Yes Fritzi Mandes, MD  mirtazapine (REMERON) 15 MG tablet Take 15 mg by mouth at bedtime. 07/21/21  Yes [provider]  Multiple Vitamin (MULTIVITAMIN WITH MINERALS) TABS tablet Take 1 tablet by mouth daily. 08/05/21  Yes Fritzi Mandes, MD  pravastatin (PRAVACHOL) 20 MG tablet Take 1 tablet (20 mg total) by mouth daily. 08/18/21  Yes Darylene Price A, FNP  spironolactone (ALDACTONE) 25 MG tablet Take 0.5 tablets (12.5 mg total) by mouth daily. 08/18/21  Yes Hackney, Otila Kluver A, FNP  azithromycin (ZITHROMAX Z-PAK) 250 MG tablet Take 2 tablets (500 mg) on  Day 1,  followed by 1 tablet (250 mg) once daily  on Days 2 through 5. Patient not taking: Reported on 10/11/2021 09/12/21   Sharman Cheek, MD  ondansetron (ZOFRAN-ODT) 4 MG disintegrating tablet Take 1 tablet (4 mg total) by mouth every 8 (eight) hours as needed for nausea or vomiting. 09/12/21   Sharman Cheek, MD  Torsemide 40 MG TABS Take 40 mg by mouth daily. Patient not taking: Reported on 09/25/2021 08/18/21   Delma Freeze, FNP    Physical Exam: Vitals:   10/11/21 2204 10/11/21 2316 10/11/21 2317 10/12/21 0028  BP: 109/86  115/88 117/82   Pulse: 72  80 69  Resp: (!) 32  (!) 28 20  Temp:  98 F (36.7 C)  98.3 F (36.8 C)  TempSrc:    Oral  SpO2: 96%  96% 98%  Weight:      Height:       Physical Exam Vitals and nursing note reviewed.  Constitutional:      General: She is not in acute distress.    Comments: Conversational dyspnea  HENT:     Head: Normocephalic and atraumatic.  Cardiovascular:     Rate and Rhythm: Normal rate and regular rhythm.     Heart sounds: Normal heart sounds.  Pulmonary:     Effort: Tachypnea present.     Breath sounds: Normal breath sounds.  Abdominal:     Palpations: Abdomen is soft.     Tenderness: There is no abdominal tenderness.  Neurological:     Mental Status: Mental status is at baseline.     Data Reviewed: Relevant notes from primary care and specialist visits, past discharge summaries as available in EHR, including Care Everywhere. Prior diagnostic testing as pertinent to current admission diagnoses Updated medications and problem lists for reconciliation ED course, including vitals, labs, imaging, treatment and response to treatment Triage notes, nursing and pharmacy notes and ED provider's notes Notable results as noted in HPI   Assessment and Plan: * Acute on chronic combined systolic and diastolic congestive heart failure (HCC) Presents with dyspnea on exertion, lower extremity edema, BNP pending 2D echo on 07/10/2021 showed EF of 25-30% with grade 1 diastolic dysfunction.   Patient has trace leg edema, positive JVD, vascular congestion and cardiomegaly on chest x-ray, clinically consistent with CHF exacerbation. Borderline blood pressure. IV Lasix.  Continue Coreg.  Hold spironolactone due to soft blood pressures Daily weights with intake and output monitoring   Acute renal failure superimposed on stage 3a chronic kidney disease (HCC) Metabolic Acidosis Baseline creatinine 0.83 on 07/13/2021.  Continue to monitor renal function in the setting of IV diuretic  treatment   Hypertension Continue carvedilol.  Will hold spironolactone due to soft blood pressures  Depression Continue mirtazapine  Elevated troponin Troponin of 40 likely secondary to demand ischemia Continue to trend Continue and Pravastatin    Thrombocytopenia (HCC) Platelets 86,000 with baseline 140,000 in July and August Continue to monitor No evidence of bleeding Will hold off on systemic anticoagulation for DVT prophylaxis, using SCDs since the      Advance Care Planning:   Code Status: Prior   Consults: none  Family Communication:   Severity of Illness: The appropriate patient status for this patient is INPATIENT. Inpatient status is judged to be reasonable and necessary in order to provide the required intensity of service to ensure the patient's safety. The patient's presenting symptoms, physical exam findings, and initial radiographic and laboratory data in the context of their chronic comorbidities is felt to place them at high risk for further clinical  deterioration. Furthermore, it is not anticipated that the patient will be medically stable for discharge from the hospital within 2 midnights of admission.   * I certify that at the point of admission it is my clinical judgment that the patient will require inpatient hospital care spanning beyond 2 midnights from the point of admission due to high intensity of service, high risk for further deterioration and high frequency of surveillance required.*  Author: Andris Baumann, MD 10/12/2021 2:46 AM  For on call review [email or web address].

## 2021-10-12 NOTE — Progress Notes (Addendum)
Brief same day note:  Patient is a 81 year old female with history of systolic CHF with EF of 25 to 81%, hypertension, hyperlipidemia, CKD stage III, anemia, depression who presented from home with complaint of 3 to 4 days history of shortness of breath, lower extremity edema.  She reported compliance with her home diuretic regimen.  On presentation,she was mildly hypotensive, tachypneic, saturating fine on room air.  Chest x-ray showed cardiac enlargement, bibasilar moderate disease/infiltrate, mild vascular congestion.  Patient was admitted for the management of acute on chronic sCHF exacerbation. Patient seen and examined at the bedside today.  She was sitting at the edge of the bed.  Not in any kind of distress, on room air.  Denies any worsening shortness of breath.  Has significant bilateral lower extremity edema.  I called her daughter and discussed about our plan.  As per the daughter, her diuretics was recently discontinued by her PCP because of risk for dehydration.  Assessment and plan:  Acute on chronic combined systolic/diastolic congestive heart failure: Presented with dyspnea, lower extremity edema.  Had positive JVD, vascular congestion, cardiomegaly on chest x-ray.  Reported compliance with her diuretic regimen at home, diet.  Started on IV Lasix. Coreg resumed, spironolactone hold due to soft blood pressure. Monitor input/output, daily weight. Echo on 07/10/2021 showed EF of 25 to 30%, grade 1 diastolic dysfunction. She follows with Dr Juliann Pares  AKI on CKD stage IIIa: Creatinine was 0.8 on 6/15 /23.  Continue diuresis, monitor BMP  Elevated troponin: Denies any chest pain, flat trend.  Likely from demand ischemia from CHF exacerbation.  No further work-up indicated  Hypertension: Continue carvedilol, blood pressure soft so spironolactone on hold  Depression: Continue mirtazapine  Hyperlipidemia: Continue statin  Hypokalemia: Supplemented with  potassium,monitor  Thrombocytopenia: Chronic, mild.  Continue to monitor.

## 2021-10-12 NOTE — Progress Notes (Signed)
Initial Nutrition Assessment  DOCUMENTATION CODES:   Not applicable  INTERVENTION:   -Continue Ensure Enlive po TID, each supplement provides 350 kcal and 20 grams of protein -Continue MVI with minerals daily -Liberalize diet to 2 gram sodium for wider variety of meal selections -Provided "Heart Failure Nutrition Therapy for the Undernourished" handouts from [facility] Nutrition Care Manual; attached to AVS/ discharge instructions  NUTRITION DIAGNOSIS:   Increased nutrient needs related to chronic illness (CHF) as evidenced by estimated needs.  GOAL:   Patient will meet greater than or equal to 90% of their needs  MONITOR:   PO intake, Supplement acceptance  REASON FOR ASSESSMENT:   Rounds    ASSESSMENT:   Pt with medical history significant of Systolic CHF (EF 25 to 30% June 2023), HTN, HLD, CKD 3A, anemia, depression, hospitalized back in June from 6/11 to 6/15 with group A strep bacteremia and then in July with CHF exacerbation who presents for evaluation of a 3 to 4-day history of shortness of breath associated with lower extremity edema in spite of compliance with her home diuretic regimen.  Pt admitted with CHF.   Reviewed I/O's: -50 ml x 24 hours   UOP: 50 ml x 24 hours    Pt unavailable at time of visit. Attempted to speak with pt via call to hospital room phone, however, unable to reach. RD unable to obtain further nutrition-related history or complete nutrition-focused physical exam at this time.   Pt currently on a heart healthy diet. No meal completion data available to assess at this time.   Pt familiar to this RD from prior admission. She has a fair appetite at baseline. She has multiple missing teeth and denies need for mechanically altered diet, as she prefers to peruse the menu and choose foods that she is able to tolerate.    Reviewed wt hx; pt has experienced a 7.2% wt loss over the past 3 months. While this is not significant for time frame, it is  concerning given pt's history of malnutrition (suspect ongoing, however, unable to identify at this time), advanced age, and multiple co-morbidities. Pt also with moderate edema, which may be masking true weight loss as well as fat and muscle depletions.   Medications reviewed and include lasix amd remeron.   Labs reviewed: K: 3.4.    Diet Order:   Diet Order             Diet Heart Room service appropriate? Yes; Fluid consistency: Thin  Diet effective now                   EDUCATION NEEDS:   No education needs have been identified at this time  Skin:  Skin Assessment: Reviewed RN Assessment  Last BM:  10/08/21  Height:   Ht Readings from Last 1 Encounters:  10/11/21 5' (1.524 m)    Weight:   Wt Readings from Last 1 Encounters:  10/12/21 54.2 kg    Ideal Body Weight:  45.5 kg  BMI:  Body mass index is 23.34 kg/m.  Estimated Nutritional Needs:   Kcal:  1650-1850  Protein:  80-95 grams  Fluid:  > 1.6 L    Levada Schilling, RD, LDN, CDCES Registered Dietitian II Certified Diabetes Care and Education Specialist Please refer to [facility] for RD and/or RD on-call/weekend/after hours pager

## 2021-10-12 NOTE — Progress Notes (Signed)
  Transition of Care [facility]) Screening Note   Patient Details  Name: Zoe Boyle Date of Birth: 25-Jun-1940   Transition of Care [facility]) CM/SW Contact:    Gildardo Griffes, LCSW Phone Number: 10/12/2021, 7:22 AM    Transition of Care Department [facility]) has reviewed patient and no TOC needs have been identified at this time. We will continue to monitor patient advancement through interdisciplinary progression rounds. If new patient transition needs arise, please place a TOC consult.  [place], [place] [phone]

## 2021-10-13 DIAGNOSIS — I5043 Acute on chronic combined systolic (congestive) and diastolic (congestive) heart failure: Secondary | ICD-10-CM | POA: Diagnosis not present

## 2021-10-13 LAB — BASIC METABOLIC PANEL
Anion gap: 11 (ref 5–15)
BUN: 57 mg/dL — ABNORMAL HIGH (ref 8–23)
CO2: 18 mmol/L — ABNORMAL LOW (ref 22–32)
Calcium: 9.1 mg/dL (ref 8.9–10.3)
Chloride: 109 mmol/L (ref 98–111)
Creatinine, Ser: 1.84 mg/dL — ABNORMAL HIGH (ref 0.44–1.00)
GFR, Estimated: 28 mL/min — ABNORMAL LOW (ref >60)
Glucose, Bld: 118 mg/dL — ABNORMAL HIGH (ref 70–99)
Potassium: 4.9 mmol/L (ref 3.5–5.1)
Sodium: 138 mmol/L (ref 135–145)

## 2021-10-13 LAB — MAGNESIUM: Magnesium: 2.2 mg/dL (ref 1.7–2.4)

## 2021-10-13 LAB — BRAIN NATRIURETIC PEPTIDE: B Natriuretic Peptide: >4500 pg/mL — ABNORMAL HIGH (ref 0.0–100.0)

## 2021-10-13 MED ORDER — FUROSEMIDE 10 MG/ML IJ SOLN
10.0000 mg/h | INTRAVENOUS | Status: AC
  Administered 2021-10-13: 4 mg/h via INTRAVENOUS
  Administered 2021-10-15: 6 mg/h via INTRAVENOUS
  Administered 2021-10-16 – 2021-10-17 (×2): 10 mg/h via INTRAVENOUS
  Filled 2021-10-13 (×4): qty 20

## 2021-10-13 MED ORDER — MEGESTROL ACETATE 40 MG/ML PO SUSP
320.0000 mg | Freq: Every day | ORAL | Status: DC
  Administered 2021-10-13: 320 mg via ORAL
  Filled 2021-10-13: qty 8
  Filled 2021-10-13: qty 10

## 2021-10-13 NOTE — Consult Note (Signed)
[facility] Consult Note: 10/13/21    Date of Admission:  10/11/2021           Reason for Consult:  AKI, volume overload   Referring Provider: Burnadette Pop, MD Primary Care Provider: Enid Baas, MD   History of Presenting Illness:  Zoe Boyle is a 81 y.o. 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 Patient presents to the hospital for worsening shortness of breath for the past 3 to 4 days.  She also has lower extremity edema.  She reported adherence to diuretic regimen.  Patient's blood pressure remains low.  Creatinine has increased with aggressive IV diuresis therefore nephrology consult has been requested for evaluation.   Review of Systems: ROS Gen: Denies any fevers or chills HEENT: No vision or hearing problems CV: No chest pain but continues to have shortness of breath and worsening lower extremity edema Resp: No cough or sputum production GI: No nausea, vomiting or diarrhea.  No blood in the stool GU : No problems with voiding.  No hematuria.  No previous history of kidney problems MS: Ambulatory.  Denies any acute joint pain or swelling Derm:   No complaints Psych: No complaints Heme: No complaints Neuro: No complaints Endocrine: No complaints   Past Medical History:  Diagnosis Date   Chronic combined systolic and diastolic CHF (congestive heart failure) (HCC)    CKD (chronic kidney disease), stage III (HCC)    Congestive heart failure (CHF) (HCC)    HLD (hyperlipidemia)    Hypertension    Normocytic anemia    Sepsis (HCC) 07/09/2021    Social History   Tobacco Use   Smoking status: Former   Smokeless tobacco: Never  Substance Use Topics   Alcohol use: Not Currently   Drug use: Not Currently    Family History  Family history unknown: Yes      OBJECTIVE: Blood pressure 98/78, pulse 76, temperature 97.8 F (36.6 C), temperature source Oral, resp. rate 20, height 5' (1.524 m), weight 54.1 kg, SpO2 98 %.  Physical Exam Physical Exam: General:  No acute distress, laying in the bed  HEENT  anicteric, moist oral mucous membrane  Pulm/lungs Pursed lip breathing, bilateral basilar crackles  CVS/Heart  irregular rhythm,    Abdomen:   Soft, nontender  Extremities:  2+ peripheral edema  Neurologic:  Alert, oriented, able to follow commands  Skin:  No acute rashes     Lab Results Lab Results  Component Value Date   WBC 4.7 10/12/2021   HGB 12.2 10/12/2021   HCT 35.9 (L) 10/12/2021   MCV 79.2 (L) 10/12/2021   PLT 80 (L) 10/12/2021    Lab Results  Component Value Date   CREATININE 1.84 (H) 10/13/2021   BUN 57 (H) 10/13/2021   NA 138 10/13/2021   K 4.9 10/13/2021   CL 109 10/13/2021   CO2 18 (L) 10/13/2021    Lab Results  Component Value Date   ALT 24 09/12/2021   AST 46 (H) 09/12/2021   ALKPHOS 47 09/12/2021   BILITOT 1.1 09/12/2021     Microbiology: No results found for this or any previous visit (from the past 240 hour(s)).  Medications: Scheduled Meds:  aspirin EC  81 mg Oral Daily   carvedilol  6.25 mg Oral BID WC   enoxaparin (LOVENOX) injection  30 mg Subcutaneous Q24H   feeding supplement  237  mL Oral TID BM   megestrol  320 mg Oral Daily   mirtazapine  15 mg Oral QHS   multivitamin with minerals  1 tablet Oral Daily   pravastatin  20 mg Oral Daily   Continuous Infusions: PRN Meds:.acetaminophen **OR** acetaminophen, ondansetron **OR** ondansetron (ZOFRAN) IV  Allergies  Allergen Reactions   Lisinopril Swelling    TONGUE SWELLED    Urinalysis: No results for input(s): "COLORURINE", "LABSPEC", "PHURINE", "GLUCOSEU", "HGBUR", "BILIRUBINUR", "KETONESUR", "PROTEINUR", "UROBILINOGEN", "NITRITE", "LEUKOCYTESUR" in the last 72 hours.  Invalid input(s): "APPERANCEUR"    Imaging: DG Chest 2  View  Result Date: 10/11/2021 CLINICAL DATA:  Short of breath EXAM: CHEST - 2 VIEW COMPARISON:  Chest 09/12/2021 FINDINGS: Marked cardiac enlargement unchanged. This may be due to cardiomyopathy and/or pericardial effusion. Mild vascular congestion without edema. Progressive bibasilar airspace disease likely atelectasis. Possible small effusions. IMPRESSION: Marked cardiac enlargement which may be due to cardiomyopathy or pericardial effusion Mild bibasilar atelectasis/infiltrate Mild vascular congestion without edema Electronically Signed   By: Marlan Palau M.D.   On: 10/11/2021 16:41      Assessment/Plan:  Zoe Boyle is a 81 y.o. female with medical problems of chronic systolic CHF with EF of 25 to 01% from June 2023, hypertension, hyperlipidemia, chronic kidney disease, anemia, depression, hospitalization from strep bacteremia in July 2023,   was admitted on 10/11/2021 for :  CHF (congestive heart failure) (HCC) [I50.9] Acute on chronic congestive heart failure, unspecified heart failure type (HCC) [I50.9] 2D echo July 2023: LVEF 25 to 30%, global hypokinesis, moderately dilated left ventricular internal cavity, severely dilated left atrium, small pericardial effusion, severe mitral regurgitation, severe tricuspid regurgitation, moderately elevated pulmonary artery systolic pressure  Acute kidney injury in the setting of multiple valvular abnormalities, severe systolic CHF and chronic kidney disease stage IIIb with baseline creatinine 1.45 from August 04, 2021/GFR 37, lower extremity edema with volume overload.  Plan: Diuresis in this patient will be very difficult because of the underlying cardiac valvular abnormalities as well as EF.  However, we will try small dose of IV Lasix infusion to see if that helps.  Overall prognosis appears to be poor.  Can consider palliative care assessment if not already done.   Hedda Crumbley Thedore Mins 10/13/21

## 2021-10-13 NOTE — Progress Notes (Signed)
Nutrition Follow-up  DOCUMENTATION CODES:   Severe malnutrition in context of chronic illness  INTERVENTION:   -Continue Ensure Enlive po TID, each supplement provides 350 kcal and 20 grams of protein -Continue MVI with minerals daily -Continue 2 gram sodium diet -Provided "Heart Failure Nutrition Therapy for the Undernourished" handouts from [facility] Nutrition Care Manual; attached to AVS/ discharge instructions  NUTRITION DIAGNOSIS:   Severe Malnutrition related to chronic illness (CHF) as evidenced by severe muscle depletion, severe fat depletion, edema.  Ongoing  GOAL:   Patient will meet greater than or equal to 90% of their needs  Progressing   MONITOR:   PO intake, Supplement acceptance  REASON FOR ASSESSMENT:   Rounds    ASSESSMENT:   Pt with medical history significant of Systolic CHF (EF 25 to 30% June 2023), HTN, HLD, CKD 3A, anemia, depression, hospitalized back in June from 6/11 to 6/15 with group A strep bacteremia and then in July with CHF exacerbation who presents for evaluation of a 3 to 4-day history of shortness of breath associated with lower extremity edema in spite of compliance with her home diuretic regimen.  Reviewed I/O's: -350 ml x 24 hours and -400 ml since admission  UOP: 350 ml x 24 hours   Case discussed with MD, who is very concerned about pt's poor appetite. Plan to start on megace today.   Pt just finished up with PT at time of visit. Per PT, pt is very short of breath. RN is aware.   Pt sitting in recliner chair at time of visit. She was not very communicative secondary to respiratory status. Pt endorses poor appetite and reports drinking her Ensure supplements.   Pt's lunch tray was untouched. She consumed 50% of one Ensure supplement and 75% of another one. Documented meal completions 20%.  Reviewed wt hx; pt has experienced a 7.2% wt loss over the past 3 months. While this is not significant for time frame, it is concerning given  pt's history of malnutrition (suspect ongoing, however, unable to identify at this time), advanced age, and multiple co-morbidities. Pt also with moderate edema, which may be masking true weight loss as well as fat and muscle depletions.   Labs reviewed.  NUTRITION - FOCUSED PHYSICAL EXAM:  Flowsheet Row Most Recent Value  Orbital Region Severe depletion  Upper Arm Region Severe depletion  Thoracic and Lumbar Region Severe depletion  Buccal Region Severe depletion  Temple Region Severe depletion  Clavicle Bone Region Severe depletion  Clavicle and Acromion Bone Region Severe depletion  Scapular Bone Region Severe depletion  Dorsal Hand Severe depletion  Patellar Region No depletion  Anterior Thigh Region No depletion  Posterior Calf Region No depletion  Edema (RD Assessment) Moderate  Hair Reviewed  Eyes Reviewed  Mouth Reviewed  Skin Reviewed  Nails Reviewed       Diet Order:   Diet Order             Diet 2 gram sodium Room service appropriate? Yes; Fluid consistency: Thin  Diet effective now                   EDUCATION NEEDS:   No education needs have been identified at this time  Skin:  Skin Assessment: Reviewed RN Assessment  Last BM:  10/12/21  Height:   Ht Readings from Last 1 Encounters:  10/11/21 5' (1.524 m)    Weight:   Wt Readings from Last 1 Encounters:  10/13/21 54.1 kg    Ideal  Body Weight:  45.5 kg  BMI:  Body mass index is 23.3 kg/m.  Estimated Nutritional Needs:   Kcal:  1650-1850  Protein:  80-95 grams  Fluid:  > 1.6 L    Levada Schilling, RD, LDN, CDCES Registered Dietitian II Certified Diabetes Care and Education Specialist Please refer to [facility] for RD and/or RD on-call/weekend/after hours pager

## 2021-10-13 NOTE — Progress Notes (Signed)
PT Cancellation Note  Patient Details Name: Zoe Boyle MRN: [id number] DOB: 1940/11/04   Cancelled Treatment:    Reason Eval/Treat Not Completed: Other (comment). Patient with elevated respiratory rate, reported feeling very SOB and requesting something to help her breathe better, RN notified. Pt declined any mobility attempts at this time, including transferring back to the bed (spO2 95-97% on room air). Attempts to educate patient on PLB and anxiety management without success. PT to re-attempt as able.   Olga Coaster PT, DPT 2:11 PM,10/13/21

## 2021-10-13 NOTE — Care Management Important Message (Signed)
Important Message  Patient Details  Name: Zoe Boyle MRN: [id number] Date of Birth: 04/04/1940   Medicare Important Message Given:  Yes     Bernadette Hoit 10/13/2021, 1:30 PM

## 2021-10-13 NOTE — Evaluation (Signed)
Occupational Therapy Evaluation Patient Details Name: Zoe Boyle MRN: [id number] DOB: February 12, 1940 Today's Date: 10/13/2021   History of Present Illness Zoe Boyle is a 81 y.o. female with medical history significant of Systolic CHF (EF 25 to 30% June 2023), HTN, HLD, CKD 3A, anemia, depression, hospitalized back in June from 6/11 to 6/15 with group A strep bacteremia and then in July with CHF exacerbation who presents to the ED for evaluation of a 3 to 4-day history of shortness of breath associated with lower extremity edema in spite of compliance with her home diuretic regimen.  She also reports adherence to her diet.  She denies chest pain, cough, fever or chills.   Clinical Impression   Ms. Noyes presents with generalized weakness, limited endurance, fatigue, and shortness of breath. She lives with her daughter and two granddaughters and has been Mod I in ADLs/fxl mobility, ambulating w/o AD, assisting in most household chores, cooking. During today's evaluation, pt appears considerably distressed, w/ flat affect and struggling for breath. She denies pain but reports feeling very tired. O2 sats remain in upper 90s throughout session. Pt has not used AD in past and has no falls history, but today is reliant on RW for maintaining standing balance, leaning heavily into RW for standing rest breaks. Pt is able to perform toileting and grooming without physical assistance but with much more time and effort than during this therapist's sessions with pt, several months ago. Provided educ re: ECS. Recommend continued OT during hospitalization, with HHOT post-DC, to assist pt in regaining strength, endurance, and return to PLOF. May also want to consider trial use of RW if pt continues to experience significant fatigue with static standing and while ambulating.     Recommendations for follow up therapy are one component of a multi-disciplinary discharge planning process, led by the attending physician.   Recommendations may be updated based on patient status, additional functional criteria and insurance authorization.   Follow Up Recommendations  Home health OT    Assistance Recommended at Discharge Intermittent Supervision/Assistance  Patient can return home with the following A little help with walking and/or transfers;A little help with bathing/dressing/bathroom;Assist for transportation    Functional Status Assessment  Patient has had a recent decline in their functional status and demonstrates the ability to make significant improvements in function in a reasonable and predictable amount of time.  Equipment Recommendations  Other (comment) (perhaps will need RW?)    Recommendations for Other Services       Precautions / Restrictions Precautions Precautions: Fall Precaution Comments: Mod fall risk Restrictions Weight Bearing Restrictions: No      Mobility Bed Mobility               General bed mobility comments: pt received/left in recliner    Transfers Overall transfer level: Needs assistance Equipment used: Rolling walker (2 wheels) Transfers: Sit to/from Stand Sit to Stand: Supervision                  Balance Overall balance assessment: Needs assistance Sitting-balance support: Bilateral upper extremity supported Sitting balance-Leahy Scale: Good     Standing balance support: Reliant on assistive device for balance, Bilateral upper extremity supported Standing balance-Leahy Scale: Fair Standing balance comment: pt struggling with balance 2/2 SOB, requires several standing rest breaks                           ADL either performed or assessed with clinical judgement  ADL Overall ADL's : Needs assistance/impaired     Grooming: Wash/dry hands;Standing;Supervision/safety                   Toilet Transfer: Secondary school teacher (2 wheels)   Toileting- Architect and Hygiene:  Supervision/safety;Sit to/from stand       Functional mobility during ADLs: Supervision/safety;Rolling walker (2 wheels)       Vision         Perception     Praxis      Pertinent Vitals/Pain Pain Assessment Pain Assessment: No/denies pain     Hand Dominance     Extremity/Trunk Assessment Upper Extremity Assessment Upper Extremity Assessment: Overall WFL for tasks assessed   Lower Extremity Assessment Lower Extremity Assessment: Generalized weakness       Communication Communication Communication: No difficulties   Cognition Arousal/Alertness: Awake/alert Behavior During Therapy: WFL for tasks assessed/performed Overall Cognitive Status: Within Functional Limits for tasks assessed                                       General Comments       Exercises Total Joint Exercises Ankle Circles/Pumps: AROM, Both, 10 reps Quad Sets: AROM, 5 reps, Both Other Exercises Other Exercises: Educ re: energy conservation strategies, POC, DC recs   Shoulder Instructions      Home Living Family/patient expects to be discharged to:: Private residence Living Arrangements: Children Available Help at Discharge: Family;Available 24 hours/day Type of Home: Apartment Home Access: Ramped entrance     Home Layout: One level     Bathroom Shower/Tub: Producer, television/film/video: Standard     Home Equipment: None          Prior Functioning/Environment Prior Level of Function : Independent/Modified Independent             Mobility Comments: MOD I-I community amb with no AD; no fall history ADLs Comments: Pt Mod I with BADL, she cooks for herself and family, assists with cleaning. Daughter performs shopping, driving        OT Problem List: Decreased strength;Decreased activity tolerance;Decreased knowledge of use of DME or AE;Impaired balance (sitting and/or standing);Increased edema      OT Treatment/Interventions: Self-care/ADL  training;Therapeutic exercise;Patient/family education;Balance training;Energy conservation;Therapeutic activities;DME and/or AE instruction    OT Goals(Current goals can be found in the care plan section) Acute Rehab OT Goals Patient Stated Goal: to feel better OT Goal Formulation: With patient Time For Goal Achievement: 10/27/21 Potential to Achieve Goals: Good ADL Goals Pt Will Perform Upper Body Dressing: with modified independence;sitting;standing Pt Will Perform Tub/Shower Transfer: with modified independence;ambulating Additional ADL Goal #1: Pt will prepare with Mod I at least one meal per day for herself and family, taking rest breaks as needed.  OT Frequency: Min 2X/week    Co-evaluation              AM-PAC OT "6 Clicks" Daily Activity     Outcome Measure Help from another person eating meals?: None Help from another person taking care of personal grooming?: A Little Help from another person toileting, which includes using toliet, bedpan, or urinal?: A Little Help from another person bathing (including washing, rinsing, drying)?: A Little Help from another person to put on and taking off regular upper body clothing?: None Help from another person to put on and taking off regular lower body clothing?: A Little 6  Click Score: 20   End of Session Equipment Utilized During Treatment: Rolling walker (2 wheels)  Activity Tolerance: Patient tolerated treatment well;Patient limited by fatigue;Other (comment) (Pt limited by SOB) Patient left: in chair;with call bell/phone within reach  OT Visit Diagnosis: Muscle weakness (generalized) (M62.81)                Time: 8421-0312 OT Time Calculation (min): 20 min Charges:  OT General Charges $OT Visit: 1 Visit OT Evaluation $OT Eval Low Complexity: 1 Low OT Treatments $Self Care/Home Management : 8-22 mins Latina Craver, PhD, MS, OTR/L 10/13/21, 2:15 PM

## 2021-10-13 NOTE — Progress Notes (Signed)
PROGRESS NOTE  Zoe Boyle  VOH:[id number] DOB: 1940/10/06 DOA: 10/11/2021 PCP: Enid Baas, MD   Brief Narrative: Patient is a 81 year old female with history of systolic CHF with EF of 25 to 69%, hypertension, hyperlipidemia, CKD stage III, anemia, depression who presented from home with complaint of 3 to 4 days history of shortness of breath, lower extremity edema.  She reported compliance with her home diuretic regimen.  On presentation,she was mildly hypotensive, tachypneic, saturating fine on room air.  Chest x-ray showed cardiac enlargement, bibasilar moderate disease/infiltrate, mild vascular congestion.  Patient was admitted for the management of acute on chronic sCHF exacerbation.  Patient was admitted for IV diuresis.  Due to worsening kidney function, cardiology, nephrology consulted today.  Assessment & Plan:  Principal Problem:   Acute on chronic combined systolic and diastolic congestive heart failure (HCC) Active Problems:   Acute renal failure superimposed on stage 3a chronic kidney disease (HCC)   Hypertension   Depression   Thrombocytopenia (HCC)   Elevated troponin   Acute on chronic combined systolic/diastolic congestive heart failure: Presented with dyspnea, lower extremity edema.  Had positive JVD, vascular congestion, cardiomegaly on chest x-ray.  Reported compliance with her diuretic regimen at home, diet.  Started on IV Lasix. Coreg resumed, spironolactone hold due to soft blood pressure. Monitor input/output, daily weight. Echo on 07/10/2021 showed EF of 25 to 30%, grade 1 diastolic dysfunction. She follows with Dr Juliann Pares.  IV Lasix on brief hold because of worsening kidney function.  Cardiology consulted. Has elevated BNP.  Might need to continue on IV diuresis, but difficultly in assessing volume status.  She has trace bilateral lower EXTR edema, lungs are almost clear. New echo is pending.   AKI on CKD stage IIIa: Creatinine was 0.8 on 6/15 /23.   Creatinine bumped up to 1.8.  Nephrology consulted   Elevated troponin: Denies any chest pain, flat trend.  Likely from demand ischemia from CHF exacerbation.  No further work-up indicated   Hypertension:Blood pressure soft so coreg, spironolactone on hold   Depression: Continue mirtazapine   Hyperlipidemia: Continue statin   Hypokalemia: Supplemented with potassium,corrected   Thrombocytopenia: Chronic, mild.  Continue to monitor.  Weakness/decreased oral intake: Nutritionist consulted.  Started on Megace.  Also consulted PT/OT evaluation     Nutrition Problem: Increased nutrient needs Etiology: chronic illness (CHF)    DVT prophylaxis:enoxaparin (LOVENOX) injection 30 mg Start: 10/12/21 0800     Code Status: Full Code  Family Communication: Called and discussed with daughter on phone on 9/15  Patient status: Inpatient  Patient is from : Home  Anticipated discharge to: Home   Estimated DC date:2-3 days   Consultants: Cardiology,ID  Procedures:None  Antimicrobials:  Anti-infectives (From admission, onward)    None       Subjective: Patient seen and examined the bedside today.  Hemodynamically stable.  On room air.  Looks weak, was attempting to eat breakfast but does not want to eat much.  Still has trace bilateral lower extremity edema.  No new complaints  Objective: Vitals:   10/13/21 0413 10/13/21 0634 10/13/21 0805 10/13/21 1159  BP: 102/81  98/78 99/81  Pulse: 77  76 69  Resp: 20  20 (!) 22  Temp: 97.7 F (36.5 C)  97.8 F (36.6 C) 98 F (36.7 C)  TempSrc:   Oral Oral  SpO2: 97%  98% 99%  Weight:  54.1 kg    Height:        Intake/Output Summary (Last 24 hours) at  10/13/2021 1202 Last data filed at 10/13/2021 1019 Gross per 24 hour  Intake 180 ml  Output 150 ml  Net 30 ml   Filed Weights   10/11/21 1607 10/12/21 0358 10/13/21 0634  Weight: 50.4 kg 54.2 kg 54.1 kg    Examination:  General exam: Weak, deconditioned elderly  female HEENT: PERRL Respiratory system: Diminished air sounds on bases, no frank wheezes or crackles  Cardiovascular system: S1 & S2 heard, RRR.  Gastrointestinal system: Abdomen is nondistended, soft and nontender. Central nervous system: Alert and oriented Extremities: Bilateral lower extremity pitting edema, no clubbing ,no cyanosis Skin: No rashes, no ulcers,no icterus     Data Reviewed: I have personally reviewed following labs and imaging studies  CBC: Recent Labs  Lab 10/11/21 1712 10/12/21 0411  WBC 4.7 4.7  HGB 12.0 12.2  HCT 36.4 35.9*  MCV 80.5 79.2*  PLT 86* 80*   Basic Metabolic Panel: Recent Labs  Lab 10/11/21 1712 10/12/21 0411 10/12/21 1127 10/13/21 0604  NA 137  --   --  138  K 3.4*  --  3.0* 4.9  CL 106  --   --  109  CO2 18*  --   --  18*  GLUCOSE 160*  --   --  118*  BUN 53*  --   --  57*  CREATININE 1.72* 1.62*  --  1.84*  CALCIUM 8.9  --   --  9.1  MG  --   --   --  2.2     No results found for this or any previous visit (from the past 240 hour(s)).   Radiology Studies: DG Chest 2 View  Result Date: 10/11/2021 CLINICAL DATA:  Short of breath EXAM: CHEST - 2 VIEW COMPARISON:  Chest 09/12/2021 FINDINGS: Marked cardiac enlargement unchanged. This may be due to cardiomyopathy and/or pericardial effusion. Mild vascular congestion without edema. Progressive bibasilar airspace disease likely atelectasis. Possible small effusions. IMPRESSION: Marked cardiac enlargement which may be due to cardiomyopathy or pericardial effusion Mild bibasilar atelectasis/infiltrate Mild vascular congestion without edema Electronically Signed   By: Marlan Palau M.D.   On: 10/11/2021 16:41    Scheduled Meds:  aspirin EC  81 mg Oral Daily   carvedilol  6.25 mg Oral BID WC   enoxaparin (LOVENOX) injection  30 mg Subcutaneous Q24H   feeding supplement  237 mL Oral TID BM   megestrol  320 mg Oral Daily   mirtazapine  15 mg Oral QHS   multivitamin with minerals  1  tablet Oral Daily   pravastatin  20 mg Oral Daily   Continuous Infusions:   LOS: 2 days   Burnadette Pop, MD [facility] P9/15/2023, 12:02 PM

## 2021-10-13 NOTE — Consult Note (Signed)
[facility] CLINIC CARDIOLOGY CONSULT NOTE       Patient ID: Zoe Boyle MRN: [id number] DOB/AGE: [id number] y.o.  Admit date: 10/11/2021 Referring Physician Dr. Burnadette Pop Primary Physician Dr. Nemiah Commander  Primary Cardiologist Dr. Juliann Pares Reason for Consultation acute on chronic HFrEF  HPI: Zoe Boyle is a 81yoF with a PMH of dilated cardiomyopathy, HFrEF (LVEF 25-30% with global hypo, severe MR 08/02/2021), small pericardial effusion, hypertension, hyperlipidemia, CKD 3 who presented to [facility] ED 10/11/2021 with shortness of breath and associated leg swelling.  Cardiology is consulted on hospital day 2 for assistance with her heart failure.  She is known to our service from hospitalization back in July 2023 that was a similar presentation of shortness of breath, peripheral edema, complicated by hypotension and an AKI that improved with large doses of IV Lasix (80 mg twice daily).  Per chart review, she presented to the ED on 09/12/2021 with nausea, vomiting, diarrhea attributed to a viral syndrome that was going around her household at the time and her home torsemide 40 mg once a day was held.  She was seen in the heart failure clinic on 8/28 and her torsemide was resumed at 20 mg once a day, BNP checked at that visit was >4500.  She returned to [facility] ED after worsening leg swelling and shortness of breath, she has been given IV Lasix 40 mg x 3 doses and the patient says she has been urinating normally although there has been minimal urine output recorded (cumulative net negative of 220 mL, some has not been collected per nursing staff).    At my time of evaluation this morning she is laying on her left side resting, she awakens easily to voice and says she feels "all right."  She tells me she has been feeling short of breath for 2 weeks prior to presentation, leg swelling has worsened over the same time period.  She does not have a great appetite, cannot really tell if she feels overall significantly  improved.  Her leg swelling was previously up to her knees on admission which has mildly improved. She remains with pitting edema around her ankles.  She currently denies chest pain, palpitations, shortness of breath.  She has chronic two-pillow orthopnea that is unchanged, denies PND.  Vitals are notable for a blood pressure of 98/78, heart rate 76 and sinus rhythm with frequent PVCs on telemetry.  Recent labs are notable for a potassium of 4.9, bicarb 18, BUN/creatinine 47/1.84 GFR of 28.  On admission bicarb was 18, BUN/creatinine was 53/1.72 and GFR was 30.  BNP checked today was greater than 4500, high-sensitivity troponin is minimally elevated with a flat trend at 46-40.  H&H at baseline at 12/35.9, platelets 88.  Chest x-ray with marked cardiac enlargement with mild bibasilar atelectasis or infiltrate, mild vascular congestion without edema.  Review of systems complete and found to be negative unless listed above     Past Medical History:  Diagnosis Date   Chronic combined systolic and diastolic CHF (congestive heart failure) (HCC)    CKD (chronic kidney disease), stage III (HCC)    Congestive heart failure (CHF) (HCC)    HLD (hyperlipidemia)    Hypertension    Normocytic anemia    Sepsis (HCC) 07/09/2021    History reviewed. No pertinent surgical history.  Medications Prior to Admission  Medication Sig Dispense Refill Last Dose   aspirin EC 81 MG tablet Take 1 tablet (81 mg total) by mouth daily. 30 tablet 5 10/11/2021  carvedilol (COREG) 6.25 MG tablet Take 1 tablet (6.25 mg total) by mouth 2 (two) times daily with a meal. 60 tablet 5 10/11/2021   feeding supplement (ENSURE ENLIVE / ENSURE PLUS) LIQD Take 237 mLs by mouth 3 (three) times daily between meals. 237 mL 12 Past Week   mirtazapine (REMERON) 15 MG tablet Take 15 mg by mouth at bedtime.   Past Week   Multiple Vitamin (MULTIVITAMIN WITH MINERALS) TABS tablet Take 1 tablet by mouth daily. 30 tablet 0 10/11/2021   pravastatin  (PRAVACHOL) 20 MG tablet Take 1 tablet (20 mg total) by mouth daily. 30 tablet 5 10/11/2021   spironolactone (ALDACTONE) 25 MG tablet Take 0.5 tablets (12.5 mg total) by mouth daily. 15 tablet 5 10/11/2021   azithromycin (ZITHROMAX Z-PAK) 250 MG tablet Take 2 tablets (500 mg) on  Day 1,  followed by 1 tablet (250 mg) once daily on Days 2 through 5. (Patient not taking: Reported on 10/11/2021) 6 each 0 Completed Course   ondansetron (ZOFRAN-ODT) 4 MG disintegrating tablet Take 1 tablet (4 mg total) by mouth every 8 (eight) hours as needed for nausea or vomiting. 20 tablet 0 prn at prn   Torsemide 40 MG TABS Take 40 mg by mouth daily. (Patient not taking: Reported on 09/25/2021) 30 tablet 5 Not Taking   Social History   Socioeconomic History   Marital status: Divorced    Spouse name: Not on file   Number of children: Not on file   Years of education: Not on file   Highest education level: Not on file  Occupational History   Not on file  Tobacco Use   Smoking status: Former   Smokeless tobacco: Never  Substance and Sexual Activity   Alcohol use: Not Currently   Drug use: Not Currently   Sexual activity: Not on file  Other Topics Concern   Not on file  Social History Narrative   Not on file   Social Determinants of Health   Financial Resource Strain: Not on file  Food Insecurity: Not on file  Transportation Needs: Not on file  Physical Activity: Not on file  Stress: Not on file  Social Connections: Not on file  Intimate Partner Violence: Not on file    Family History  Family history unknown: Yes      PHYSICAL EXAM General: Elderly and frail-appearing black female, in no acute distress.  Laying on her left side in hospital bed. HEENT:  Normocephalic and atraumatic. Neck:  No JVD.  Lungs: Somewhat short of breath appearing on room air.  Trace crackles left base.  No appreciable wheezes. Heart: HRRR . Normal S1 and S2.  2/6 systolic murmur best heard at the apex.  Abdomen:  Non-distended appearing.  Msk: Normal strength and tone for age. Extremities: Warm and well perfused. No clubbing, cyanosis.  1-2+ pitting edema bilateral lower extremities edema.  Neuro: Alert and oriented X 3. Psych:  Answers questions appropriately.   Labs: Basic Metabolic Panel: Recent Labs    10/11/21 1712 10/12/21 0411 10/12/21 1127 10/13/21 0604  NA 137  --   --  138  K 3.4*  --  3.0* 4.9  CL 106  --   --  109  CO2 18*  --   --  18*  GLUCOSE 160*  --   --  118*  BUN 53*  --   --  57*  CREATININE 1.72* 1.62*  --  1.84*  CALCIUM 8.9  --   --  9.1  Liver Function Tests: No results for input(s): "AST", "ALT", "ALKPHOS", "BILITOT", "PROT", "ALBUMIN" in the last 72 hours. No results for input(s): "LIPASE", "AMYLASE" in the last 72 hours. CBC: Recent Labs    10/11/21 1712 10/12/21 0411  WBC 4.7 4.7  HGB 12.0 12.2  HCT 36.4 35.9*  MCV 80.5 79.2*  PLT 86* 80*   Cardiac Enzymes: Recent Labs    10/11/21 1712 10/11/21 1825  TROPONINIHS 46* 40*   BNP: Invalid input(s): "POCBNP" D-Dimer: No results for input(s): "DDIMER" in the last 72 hours. Hemoglobin A1C: No results for input(s): "HGBA1C" in the last 72 hours. Fasting Lipid Panel: No results for input(s): "CHOL", "HDL", "LDLCALC", "TRIG", "CHOLHDL", "LDLDIRECT" in the last 72 hours. Thyroid Function Tests: No results for input(s): "TSH", "T4TOTAL", "T3FREE", "THYROIDAB" in the last 72 hours.  Invalid input(s): "FREET3" Anemia Panel: No results for input(s): "VITAMINB12", "FOLATE", "FERRITIN", "TIBC", "IRON", "RETICCTPCT" in the last 72 hours.  DG Chest 2 View  Result Date: 10/11/2021 CLINICAL DATA:  Short of breath EXAM: CHEST - 2 VIEW COMPARISON:  Chest 09/12/2021 FINDINGS: Marked cardiac enlargement unchanged. This may be due to cardiomyopathy and/or pericardial effusion. Mild vascular congestion without edema. Progressive bibasilar airspace disease likely atelectasis. Possible small effusions. IMPRESSION:  Marked cardiac enlargement which may be due to cardiomyopathy or pericardial effusion Mild bibasilar atelectasis/infiltrate Mild vascular congestion without edema Electronically Signed   By: Marlan Palau M.D.   On: 10/11/2021 16:41     Radiology: DG Chest 2 View  Result Date: 10/11/2021 CLINICAL DATA:  Short of breath EXAM: CHEST - 2 VIEW COMPARISON:  Chest 09/12/2021 FINDINGS: Marked cardiac enlargement unchanged. This may be due to cardiomyopathy and/or pericardial effusion. Mild vascular congestion without edema. Progressive bibasilar airspace disease likely atelectasis. Possible small effusions. IMPRESSION: Marked cardiac enlargement which may be due to cardiomyopathy or pericardial effusion Mild bibasilar atelectasis/infiltrate Mild vascular congestion without edema Electronically Signed   By: Marlan Palau M.D.   On: 10/11/2021 16:41    ECHO 08/02/2021 IMPRESSIONS    1. Left ventricular ejection fraction, by estimation, is 25 to 30%. The  left ventricle has severely decreased function. The left ventricle  demonstrates global hypokinesis. The left ventricular internal cavity size  was moderately to severely dilated.   2. Left atrial size was severely dilated.   3. A small pericardial effusion is present. There is no evidence of  cardiac tamponade.   4. The mitral valve is normal in structure. Severe mitral valve  regurgitation.   5. Tricuspid valve regurgitation is severe.   6. The aortic valve is normal in structure.   7. There is moderately elevated pulmonary artery systolic pressure.   8. The inferior vena cava is dilated in size with <50% respiratory  variability, suggesting right atrial pressure of 15 mmHg.   Conclusion(s)/Recommendation(s): Limited study. No change in pericardial  effusion. Ef remains poor.   FINDINGS   Left Ventricle: Left ventricular ejection fraction, by estimation, is 25  to 30%. The left ventricle has severely decreased function. The left  ventricle  demonstrates global hypokinesis. The left ventricular internal  cavity size was moderately to severely   dilated. There is no left ventricular hypertrophy.   Right Ventricle: There is moderately elevated pulmonary artery systolic  pressure. The tricuspid regurgitant velocity is 3.04 m/s, and with an  assumed right atrial pressure of 15 mmHg, the estimated right ventricular  systolic pressure is 52.0 mmHg.   Left Atrium: Left atrial size was severely dilated.   Pericardium:  A small pericardial effusion is present. There is no evidence  of cardiac tamponade.   Mitral Valve: The mitral valve is normal in structure. Severe mitral valve  regurgitation, with centrally-directed jet.   Tricuspid Valve: The tricuspid valve is normal in structure. Tricuspid  valve regurgitation is severe.   Aortic Valve: The aortic valve is normal in structure.   Pulmonic Valve: The pulmonic valve was normal in structure.   Venous: The inferior vena cava is dilated in size with less than 50%  respiratory variability, suggesting right atrial pressure of 15 mmHg.   IAS/Shunts: No atrial level shunt detected by color flow Doppler.   LEFT VENTRICLE  PLAX 2D  LVIDd:         6.62 cm  LVIDs:         5.83 cm  LV PW:         1.06 cm  LV IVS:        0.74 cm     LV Volumes (MOD)  LV vol d, MOD A2C: 191.0 ml  LV vol d, MOD A4C: 205.0 ml  LV vol s, MOD A2C: 134.0 ml  LV vol s, MOD A4C: 149.0 ml  LV SV MOD A2C:     57.0 ml  LV SV MOD A4C:     205.0 ml  LV SV MOD BP:      58.3 ml   LEFT ATRIUM         Index  LA diam:    5.90 cm 3.79 cm/m   TRICUSPID VALVE  TR Peak grad:   37.0 mmHg  TR Vmax:        304.00 cm/s   Donnelly Angelica  Electronically signed by Donnelly Angelica  Signature Date/Time: 08/02/2021/1:07:50 PM      TELEMETRY reviewed by me (LT) 10/13/2021 : Sinus rhythm rate 70s to 80s, with frequent PACs, PVCs  EKG reviewed by me: Sinus rhythm rate 74 LBBB, PACs  Data reviewed by me (LT) 10/13/2021: Primary  care note, ED visit from 8/15, heart failure nurse navigator note, admission H&P, ED note, CBC, BMP, chest x-ray, ordered BNP  ASSESSMENT AND PLAN:  Zoe Boyle is a 72yoF with a PMH of dilated cardiomyopathy, HFrEF (LVEF 25-30% with global hypo, severe MR 08/02/2021), hypertension, hyperlipidemia, CKD 3 who presented to [facility] ED 10/11/2021 with shortness of breath and associated leg swelling.  Cardiology is consulted on hospital day 2 for assistance with her heart failure.  #Acute on chronic HFrEF (EF 25-30% 08/02/2021) #Small pericardial effusion #LBBB She presents with a 2-week history of worsening shortness of breath and peripheral edema. Her home torsemide 40 mg was held for 2 weeks at the end of August due to a GI illness, and resumed at 20 mg once daily for the past 2 weeks.  BMP checked on 9/15 was elevated at greater than 4500, chest x-ray shows marked cardiomegaly without significant edema, but she remains volume overloaded clinically in her lower extremities.  She was given IV Lasix 40 mg x 3 doses with some clinical improvement, but renal function has worsened so primary team discontinue diuretics this morning. -Would recommend continuing IV diuresis, during her previous admission she required large doses of Lasix (80 mg IV twice daily) for clinical improvement and improvement in renal function. -If nephrology is on board, recommend 80 mg IV Lasix twice daily.  -Continue GDMT with carvedilol 6.25 mg twice daily -She has anaphylaxis to lisinopril, ARB/Entresto are contraindicated -Can consider addition of Jardiance pending renal improvement -We  will repeat limited echocardiogram to evaluate size for pericardial effusion and ejection fraction. -Close monitoring of I's and O's -She will need close following with outpatient cardiology for consideration of CRT-D in the future with her reduced EF and LBBB.  #AKI on CKD 3 Previously had normal renal function in 06/2021 with creatinine 1.0 and GFR  >60.  At discharge from her admission in July her baseline was 1.70 and 30. -After trial of diuresis this admission, her creatinine is 1.84 and GFR is 28. -Appreciate nephrology assistance  #Elevated troponin Minimally elevated with a flat trend, likely demand from volume overload and AKI and not ACS in the absence of chest pain or EKG changes.  This patient's plan of care was discussed and created with Dr. Saralyn Pilar and he is in agreement.  Signed: Tristan Schroeder , PA-C 10/13/2021, 10:37 AM [facility] Cardiology

## 2021-10-14 ENCOUNTER — Inpatient Hospital Stay
Admit: 2021-10-14 | Discharge: 2021-10-14 | Disposition: A | Discharge status: Home / Self Care | Payer: Medicare Other | Attending: Cardiology | Admitting: Cardiology

## 2021-10-14 DIAGNOSIS — I5043 Acute on chronic combined systolic (congestive) and diastolic (congestive) heart failure: Secondary | ICD-10-CM | POA: Diagnosis not present

## 2021-10-14 LAB — ECHOCARDIOGRAM LIMITED
Area-P 1/2: 4.31 cm2
Calc EF: 31.5 %
Height: 60 in
MV VTI: 0.27 cm2
S' Lateral: 6.6 cm
Single Plane A2C EF: 37.5 %
Single Plane A4C EF: 30.6 %
Weight: 1894.19 oz

## 2021-10-14 LAB — CBC
HCT: 38.5 % (ref 36.0–46.0)
Hemoglobin: 12.2 g/dL (ref 12.0–15.0)
MCH: 26 pg (ref 26.0–34.0)
MCHC: 31.7 g/dL (ref 30.0–36.0)
MCV: 81.9 fL (ref 80.0–100.0)
Platelets: 119 10*3/uL — ABNORMAL LOW (ref 150–400)
RBC: 4.7 MIL/uL (ref 3.87–5.11)
RDW: 17.2 % — ABNORMAL HIGH (ref 11.5–15.5)
WBC: 4.7 10*3/uL (ref 4.0–10.5)
nRBC: 0.6 % — ABNORMAL HIGH (ref 0.0–0.2)

## 2021-10-14 LAB — BASIC METABOLIC PANEL
Anion gap: 10 (ref 5–15)
BUN: 63 mg/dL — ABNORMAL HIGH (ref 8–23)
CO2: 18 mmol/L — ABNORMAL LOW (ref 22–32)
Calcium: 9.4 mg/dL (ref 8.9–10.3)
Chloride: 111 mmol/L (ref 98–111)
Creatinine, Ser: 1.93 mg/dL — ABNORMAL HIGH (ref 0.44–1.00)
GFR, Estimated: 26 mL/min — ABNORMAL LOW (ref >60)
Glucose, Bld: 144 mg/dL — ABNORMAL HIGH (ref 70–99)
Potassium: 4.8 mmol/L (ref 3.5–5.1)
Sodium: 139 mmol/L (ref 135–145)

## 2021-10-14 MED ORDER — RISAQUAD PO CAPS
1.0000 | ORAL_CAPSULE | Freq: Every day | ORAL | Status: DC
  Administered 2021-10-14 – 2021-10-20 (×7): 1 via ORAL
  Filled 2021-10-14 (×7): qty 1

## 2021-10-14 MED ORDER — MEGESTROL ACETATE 400 MG/10ML PO SUSP
320.0000 mg | Freq: Every day | ORAL | Status: DC
  Administered 2021-10-14 – 2021-10-20 (×7): 320 mg via ORAL
  Filled 2021-10-14 (×7): qty 10

## 2021-10-14 MED ORDER — PERFLUTREN LIPID MICROSPHERE
1.0000 mL | INTRAVENOUS | Status: AC | PRN
  Administered 2021-10-14: 2 mL via INTRAVENOUS

## 2021-10-14 MED ORDER — SODIUM CHLORIDE 0.9 % IV SOLN
12.5000 mg | Freq: Four times a day (QID) | INTRAVENOUS | Status: DC | PRN
  Filled 2021-10-14: qty 0.5

## 2021-10-14 NOTE — Progress Notes (Signed)
[facility] Cardiology  SUBJECTIVE: Patient laying in bed, denies chest pain, reports breathing improved   Vitals:   10/13/21 2342 10/14/21 0357 10/14/21 0515 10/14/21 0809  BP: 101/78 107/72  (!) 107/97  Pulse: 68 82  65  Resp: 20 20  20   Temp: 97.9 F (36.6 C) 97.7 F (36.5 C)  (!) 97.4 F (36.3 C)  TempSrc:    Axillary  SpO2: 100% 100%  100%  Weight:   53.7 kg   Height:         Intake/Output Summary (Last 24 hours) at 10/14/2021 0929 Last data filed at 10/14/2021 0558 Gross per 24 hour  Intake 209.53 ml  Output 400 ml  Net -190.47 ml      PHYSICAL EXAM  General: Well developed, well nourished, in no acute distress HEENT:  Normocephalic and atramatic Neck:  No JVD.  Lungs: Clear bilaterally to auscultation and percussion. Heart: HRRR . Normal S1 and S2 without gallops or murmurs.  Abdomen: Bowel sounds are positive, abdomen soft and non-tender  Msk:  Back normal, normal gait. Normal strength and tone for age. Extremities: No clubbing, cyanosis or edema.   Neuro: Alert and oriented X 3. Psych:  Good affect, responds appropriately   LABS: Basic Metabolic Panel: Recent Labs    10/13/21 0604 10/14/21 0749  NA 138 139  K 4.9 4.8  CL 109 111  CO2 18* 18*  GLUCOSE 118* 144*  BUN 57* 63*  CREATININE 1.84* 1.93*  CALCIUM 9.1 9.4  MG 2.2  --    Liver Function Tests: No results for input(s): "AST", "ALT", "ALKPHOS", "BILITOT", "PROT", "ALBUMIN" in the last 72 hours. No results for input(s): "LIPASE", "AMYLASE" in the last 72 hours. CBC: Recent Labs    10/11/21 1712 10/12/21 0411  WBC 4.7 4.7  HGB 12.0 12.2  HCT 36.4 35.9*  MCV 80.5 79.2*  PLT 86* 80*   Cardiac Enzymes: No results for input(s): "CKTOTAL", "CKMB", "CKMBINDEX", "TROPONINI" in the last 72 hours. BNP: Invalid input(s): "POCBNP" D-Dimer: No results for input(s): "DDIMER" in the last 72 hours. Hemoglobin A1C: No results for input(s): "HGBA1C" in the last 72 hours. Fasting Lipid Panel: No  results for input(s): "CHOL", "HDL", "LDLCALC", "TRIG", "CHOLHDL", "LDLDIRECT" in the last 72 hours. Thyroid Function Tests: No results for input(s): "TSH", "T4TOTAL", "T3FREE", "THYROIDAB" in the last 72 hours.  Invalid input(s): "FREET3" Anemia Panel: No results for input(s): "VITAMINB12", "FOLATE", "FERRITIN", "TIBC", "IRON", "RETICCTPCT" in the last 72 hours.  No results found.   Echo pending  TELEMETRY: Sinus rhythm 75 bpm with multifocal PVCs:  ASSESSMENT AND PLAN:  Principal Problem:   Acute on chronic combined systolic and diastolic congestive heart failure (HCC) Active Problems:   Acute renal failure superimposed on stage 3a chronic kidney disease (HCC)   Hypertension   Depression   Thrombocytopenia (HCC)   Elevated troponin    1.  Acute on chronic systolic congestive heart failure, HFrEF EF 25-30% 08/02/2021, clinically improved after initial diuresis, with resultant worsening renal function, seen by nephrology, started on furosemide infusion 2.  AKI with underlying CKD stage III, followed by nephrology 3.  Elevated troponin (30, 32, 28), most consistent with myocardial injury due to chronic kidney disease without ischemia 4.  Chronic LBBB  Recommendations  1.  Agree current therapy 2.  Continue furosemide infusion 3.  Carefully monitor renal status, nephrology to follow 4.  Consider adding empagliflozin once renal status stabilizes which can be done as outpatient 5.  Outpatient evaluation for CRT-D per  Dr. Mariana Single, MD, PhD, [facility] 10/14/2021 9:29 AM

## 2021-10-14 NOTE — Progress Notes (Signed)
PROGRESS NOTE    Zoe Boyle  [id number] DOB: 04-Dec-1940 DOA: 10/11/2021 PCP: Gladstone Lighter, MD   Brief Narrative:  Patient is a 81 year old female with history of systolic CHF with EF of 25 to 30%, hypertension, hyperlipidemia, CKD stage III, anemia, depression who presented from home with complaint of 3 to 4 days history of shortness of breath, lower extremity edema.  She reported compliance with her home diuretic regimen.  On presentation,she was mildly hypotensive, tachypneic, saturating fine on room air.  Chest x-ray showed cardiac enlargement, bibasilar moderate disease/infiltrate, mild vascular congestion.  Patient was admitted for the management of acute on chronic sCHF exacerbation.  Patient was admitted for IV diuresis.  Due to worsening kidney function, cardiology, nephrology consulted.  Assessment & Plan:   Acute on chronic combined systolic/diastolic congestive heart failure:  -Echo showed ejection fraction of 25 to 30%.  Currently on furosemide infusion -Consulted cardiology-appreciate help  -Monitor input/output, daily weight. -Cardiology recommended to start empagliflozin once renal status stabilizes and can be done outpatient.  Recommended outpatient evaluation for CRT-D per Dr. Clayborn Bigness  Elevated troponin: Likely demand ischemia in the setting of worsening kidney function.  Patient denies any chest pain.  AKI on CKD stage IIIa: Creatinine was 0.8 on 6/15 /23.  Creatinine bumped up to 1.8.  Nephrology consulted-on furosemide infusion. -Creatinine: 1.93 this morning.  -Monitor kidney function closely.  Loose stool: -Per RN: Patient had 3 loose stool this morning.  Not watery.  [place].  Not associated with blood.  Patient is complaining of nausea. -On Zofran and Phenergan as needed.  Probiotics added. -She is afebrile, no abdominal tenderness noted on exam.  No leukocytosis.   Elevated troponin: Denies any chest pain, flat trend.  Likely from demand ischemia from  CHF exacerbation.  No further work-up indicated   Hypertension:Blood pressure soft so coreg, spironolactone on hold   Depression: Continue mirtazapine   Hyperlipidemia: Continue statin   Hypokalemia: Resolved  Thrombocytopenia: Chronic, mild.  Continue to monitor.   Weakness/decreased oral intake: Nutritionist consulted.  on Megace.  -PT recommended home health PT  Patient is at increased risk for morbidity and mortality due to multiple underlying medical problems.  I consulted palliative care to discuss goals of care with patient and her family.  DVT prophylaxis: Lovenox Code Status: Full code-form with the patient. Family Communication: None present at bedside.  Plan of care discussed with patient in length and she verbalized understanding and agreed with it. Disposition Plan: To be determined  Consultants:  Neurology Nephrology  Procedures:  None  Antimicrobials:  None none  Status is: Inpatient    Subjective: Seen and examined.  Lying comfortably on the bed.  Reports that her breathing has improved.  No fever.  No abdominal pain.  Has nausea.  As per RN: Patient had 3 loose stools this morning.  Not associated with blood.  Daughter is concerned about her loose stools and nausea.  Objective: Vitals:   10/14/21 0357 10/14/21 0515 10/14/21 0809 10/14/21 1158  BP: 107/72  (!) 107/97 96/72  Pulse: 82  65 75  Resp: 20  20 19   Temp: 97.7 F (36.5 C)  (!) 97.4 F (36.3 C) (!) 97.4 F (36.3 C)  TempSrc:   Axillary Oral  SpO2: 100%  100% 99%  Weight:  53.7 kg    Height:        Intake/Output Summary (Last 24 hours) at 10/14/2021 1455 Last data filed at 10/14/2021 1000 Gross per 24 hour  Intake 269.53 ml  Output 400 ml  Net -130.47 ml   Filed Weights   10/12/21 0358 10/13/21 0634 10/14/21 0515  Weight: 54.2 kg 54.1 kg 53.7 kg    Examination:  General exam: Appears calm and comfortable, elderly female, has nasal cannula, appears weak and sick Respiratory  system: Decreased breath sounds on the basis  cardiovascular system: S1 & S2 heard, RRR. No JVD, murmurs, rubs, gallops or clicks.  Bilateral 3+ pitting edema positive  gastrointestinal system: Abdomen is nondistended, soft and nontender. No organomegaly or masses felt. Normal bowel sounds heard. Central nervous system: Alert and oriented. No focal neurological deficits. Extremities: Symmetric 5 x 5 power. Skin: No rashes, lesions or ulcers Psychiatry: Judgement and insight appear normal. Mood & affect appropriate.    Data Reviewed: I have personally reviewed following labs and imaging studies  CBC: Recent Labs  Lab 10/11/21 1712 10/12/21 0411 10/14/21 0752  WBC 4.7 4.7 4.7  HGB 12.0 12.2 12.2  HCT 36.4 35.9* 38.5  MCV 80.5 79.2* 81.9  PLT 86* 80* 540*   Basic Metabolic Panel: Recent Labs  Lab 10/11/21 1712 10/12/21 0411 10/12/21 1127 10/13/21 0604 10/14/21 0749  NA 137  --   --  138 139  K 3.4*  --  3.0* 4.9 4.8  CL 106  --   --  109 111  CO2 18*  --   --  18* 18*  GLUCOSE 160*  --   --  118* 144*  BUN 53*  --   --  57* 63*  CREATININE 1.72* 1.62*  --  1.84* 1.93*  CALCIUM 8.9  --   --  9.1 9.4  MG  --   --   --  2.2  --    GFR: Estimated Creatinine Clearance: 17 mL/min (A) (by C-G formula based on SCr of 1.93 mg/dL (H)). Liver Function Tests: No results for input(s): "AST", "ALT", "ALKPHOS", "BILITOT", "PROT", "ALBUMIN" in the last 168 hours. No results for input(s): "LIPASE", "AMYLASE" in the last 168 hours. No results for input(s): "AMMONIA" in the last 168 hours. Coagulation Profile: No results for input(s): "INR", "PROTIME" in the last 168 hours. Cardiac Enzymes: No results for input(s): "CKTOTAL", "CKMB", "CKMBINDEX", "TROPONINI" in the last 168 hours. BNP (last 3 results) No results for input(s): "PROBNP" in the last 8760 hours. HbA1C: No results for input(s): "HGBA1C" in the last 72 hours. CBG: No results for input(s): "GLUCAP" in the last 168  hours. Lipid Profile: No results for input(s): "CHOL", "HDL", "LDLCALC", "TRIG", "CHOLHDL", "LDLDIRECT" in the last 72 hours. Thyroid Function Tests: No results for input(s): "TSH", "T4TOTAL", "FREET4", "T3FREE", "THYROIDAB" in the last 72 hours. Anemia Panel: No results for input(s): "VITAMINB12", "FOLATE", "FERRITIN", "TIBC", "IRON", "RETICCTPCT" in the last 72 hours. Sepsis Labs: No results for input(s): "PROCALCITON", "LATICACIDVEN" in the last 168 hours.  No results found for this or any previous visit (from the past 240 hour(s)).    Radiology Studies: ECHOCARDIOGRAM LIMITED  Result Date: 10/14/2021    ECHOCARDIOGRAM LIMITED REPORT   Patient Name:   Zoe Boyle Date of Exam: 10/14/2021 Medical Rec #:  [id number]  Height:       60.0 in Accession #:    [id number] Weight:       118.4 lb Date of Birth:  1940-08-12  BSA:          1.494 m Patient Age:    89 years   BP:           107/72 mmHg Patient Gender: F  HR:           85 bpm. Exam Location:  [facility] Procedure: Limited Echo, Intracardiac Opacification Agent and Limited Color            Doppler Indications:     Acute Systolic CHF  History:         Patient has prior history of Echocardiogram examinations. CHF;                  Risk Factors:CKD, Dyslipidemia and Hypertension.  Sonographer:     L Thornton-Maynard Referring Phys:  Tristan Schroeder Diagnosing Phys: Isaias Cowman MD IMPRESSIONS  1. Left ventricular ejection fraction, by estimation, is 30 to 35%. The left ventricle has moderately decreased function. The left ventricle has no regional wall motion abnormalities. The left ventricular internal cavity size was mildly dilated.  2. Right ventricular systolic function is normal. The right ventricular size is normal.  3. Left atrial size was severely dilated.  4. Right atrial size was severely dilated.  5. The mitral valve is normal in structure. Severe mitral valve regurgitation. No evidence of mitral stenosis.  6. Tricuspid valve  regurgitation is severe.  7. The aortic valve is normal in structure. Aortic valve regurgitation is not visualized. No aortic stenosis is present.  8. The inferior vena cava is normal in size with greater than 50% respiratory variability, suggesting right atrial pressure of 3 mmHg. FINDINGS  Left Ventricle: Left ventricular ejection fraction, by estimation, is 30 to 35%. The left ventricle has moderately decreased function. The left ventricle has no regional wall motion abnormalities. Definity contrast agent was given IV to delineate the left ventricular endocardial borders. The left ventricular internal cavity size was mildly dilated. There is no left ventricular hypertrophy. Right Ventricle: The right ventricular size is normal. No increase in right ventricular wall thickness. Right ventricular systolic function is normal. Left Atrium: Left atrial size was severely dilated. Right Atrium: Right atrial size was severely dilated. Pericardium: There is no evidence of pericardial effusion. Mitral Valve: The mitral valve is normal in structure. Severe mitral valve regurgitation. No evidence of mitral valve stenosis. MV peak gradient, 14.6 mmHg. The mean mitral valve gradient is 7.0 mmHg. Tricuspid Valve: The tricuspid valve is normal in structure. Tricuspid valve regurgitation is severe. No evidence of tricuspid stenosis. Aortic Valve: The aortic valve is normal in structure. Aortic valve regurgitation is not visualized. No aortic stenosis is present. Pulmonic Valve: The pulmonic valve was normal in structure. Pulmonic valve regurgitation is not visualized. No evidence of pulmonic stenosis. Aorta: The aortic root is normal in size and structure. Venous: The inferior vena cava is normal in size with greater than 50% respiratory variability, suggesting right atrial pressure of 3 mmHg. IAS/Shunts: No atrial Boyle shunt detected by color flow Doppler. Additional Comments: There is a small pleural effusion. LEFT VENTRICLE  PLAX 2D LVIDd:         7.20 cm      Diastology LVIDs:         6.60 cm      LV e' medial:    4.29 cm/s LV PW:         0.80 cm      LV E/e' medial:  32.6 LV IVS:        0.90 cm      LV e' lateral:   7.01 cm/s LVOT diam:     1.80 cm      LV E/e' lateral: 20.0 LV SV:  11 LV SV Index:   7 LVOT Area:     2.54 cm  LV Volumes (MOD) LV vol d, MOD A2C: 184.0 ml LV vol d, MOD A4C: 186.0 ml LV vol s, MOD A2C: 115.0 ml LV vol s, MOD A4C: 129.0 ml LV SV MOD A2C:     69.0 ml LV SV MOD A4C:     186.0 ml LV SV MOD BP:      59.1 ml RIGHT VENTRICLE         IVC TAPSE (M-mode): 1.2 cm  IVC diam: 3.30 cm AORTIC VALVE LVOT Vmax:   48.90 cm/s LVOT Vmean:  29.200 cm/s LVOT VTI:    0.043 m MITRAL VALVE                TRICUSPID VALVE MV Area (PHT): 4.31 cm     TR Peak grad:   20.4 mmHg MV Area VTI:   0.27 cm     TR Vmax:        226.00 cm/s MV Peak grad:  14.6 mmHg MV Mean grad:  7.0 mmHg     SHUNTS MV Vmax:       1.91 m/s     Systemic VTI:  0.04 m MV Vmean:      122.0 cm/s   Systemic Diam: 1.80 cm MV Decel Time: 176 msec MV E velocity: 140.00 cm/s MV A velocity: 84.40 cm/s MV E/A ratio:  1.66 Isaias Cowman MD Electronically signed by Isaias Cowman MD Signature Date/Time: 10/14/2021/10:33:44 AM    Final     Scheduled Meds:  acidophilus  1 capsule Oral Daily   aspirin EC  81 mg Oral Daily   enoxaparin (LOVENOX) injection  30 mg Subcutaneous Q24H   feeding supplement  237 mL Oral TID BM   megestrol  320 mg Oral Daily   mirtazapine  15 mg Oral QHS   multivitamin with minerals  1 tablet Oral Daily   pravastatin  20 mg Oral Daily   Continuous Infusions:  furosemide (LASIX) 200 mg in dextrose 5 % 100 mL (2 mg/mL) infusion 4 mg/hr (10/14/21 0558)   promethazine (PHENERGAN) injection (IM or IVPB)       LOS: 3 days   Time spent: 35 minutes   Raghav Verrilli Loann Quill, MD [facility]  If 7PM-7AM, please contact night-coverage [email or web address] 10/14/2021, 2:55 PM

## 2021-10-14 NOTE — Progress Notes (Signed)
[facility]  ROUNDING NOTE   Subjective:   Patient seen resting quietly Alert States she feels better since admission, back to baseline Remains on 2 L nasal cannula, breathing comfortably Lower extremity edema improved  Objective:  Vital signs in last 24 hours:  Temp:  [97.4 F (36.3 C)-97.9 F (36.6 C)] 97.4 F (36.3 C) (09/16 1158) Pulse Rate:  [65-82] 75 (09/16 1158) Resp:  [18-20] 19 (09/16 1158) BP: (96-107)/(69-97) 96/72 (09/16 1158) SpO2:  [99 %-100 %] 99 % (09/16 1158) Weight:  [53.7 kg] 53.7 kg (09/16 0515)  Weight change: -0.414 kg Filed Weights   10/12/21 0358 10/13/21 0634 10/14/21 0515  Weight: 54.2 kg 54.1 kg 53.7 kg    Intake/Output: I/O last 3 completed shifts: In: 209.5 [P.O.:180; I.V.:29.5] Out: 400 [Urine:400]   Intake/Output this shift:  Total I/O In: 240 [P.O.:240] Out: -   Physical Exam: General: NAD,   Head: Normocephalic, atraumatic. Moist oral mucosal membranes  Eyes: Anicteric  Lungs:  Diminished in bases, normal effort, [place] O2  Heart: Regular rate and rhythm  Abdomen:  Soft, nontender  Extremities:  1+ peripheral edema.  Neurologic: Nonfocal, moving all four extremities  Skin: No lesions  Access: None    Basic Metabolic Panel: Recent Labs  Lab 10/11/21 1712 10/12/21 0411 10/12/21 1127 10/13/21 0604 10/14/21 0749  NA 137  --   --  138 139  K 3.4*  --  3.0* 4.9 4.8  CL 106  --   --  109 111  CO2 18*  --   --  18* 18*  GLUCOSE 160*  --   --  118* 144*  BUN 53*  --   --  57* 63*  CREATININE 1.72* 1.62*  --  1.84* 1.93*  CALCIUM 8.9  --   --  9.1 9.4  MG  --   --   --  2.2  --     Liver Function Tests: No results for input(s): "AST", "ALT", "ALKPHOS", "BILITOT", "PROT", "ALBUMIN" in the last 168 hours. No results for input(s): "LIPASE", "AMYLASE" in the last 168 hours. No results for input(s): "AMMONIA" in the last 168 hours.  CBC: Recent Labs  Lab 10/11/21 1712 10/12/21 0411  WBC 4.7 4.7  HGB 12.0  12.2  HCT 36.4 35.9*  MCV 80.5 79.2*  PLT 86* 80*    Cardiac Enzymes: No results for input(s): "CKTOTAL", "CKMB", "CKMBINDEX", "TROPONINI" in the last 168 hours.  BNP: Invalid input(s): "POCBNP"  CBG: No results for input(s): "GLUCAP" in the last 168 hours.  Microbiology: Results for orders placed or performed during the hospital encounter of 09/12/21  SARS Coronavirus 2 by RT PCR (hospital order, performed in [facility] hospital lab) *cepheid single result test* Anterior Nasal Swab     Status: None   Collection Time: 09/12/21  5:23 AM   Specimen: Anterior Nasal Swab  Result Value Ref Range Status   SARS Coronavirus 2 by RT PCR NEGATIVE NEGATIVE Final    Comment: (NOTE) SARS-CoV-2 target nucleic acids are NOT DETECTED.  The SARS-CoV-2 RNA is generally detectable in upper and lower respiratory specimens during the acute phase of infection. The lowest concentration of SARS-CoV-2 viral copies this assay can detect is 250 copies / mL. A negative result does not preclude SARS-CoV-2 infection and should not be used as the sole basis for treatment or other patient management decisions.  A negative result may occur with improper specimen collection / handling, submission of specimen other than nasopharyngeal swab, presence of  viral mutation(s) within the areas targeted by this assay, and inadequate number of viral copies (<250 copies / mL). A negative result must be combined with clinical observations, patient history, and epidemiological information.  Fact Sheet for Patients:   https://www.patel.info/  Fact Sheet for Healthcare Providers: https://hall.com/  This test is not yet approved or  cleared by the [place] FDA and has been authorized for detection and/or diagnosis of SARS-CoV-2 by FDA under an Emergency Use Authorization (EUA).  This EUA will remain in effect (meaning this test can be used) for the duration of  the COVID-19 declaration under Section 564(b)(1) of the Act, 21 U.S.C. section 360bbb-3(b)(1), unless the authorization is terminated or revoked sooner.  Performed at [facility], [address] [place], [place] [address]     Coagulation Studies: No results for input(s): "LABPROT", "INR" in the last 72 hours.  Urinalysis: No results for input(s): "COLORURINE", "LABSPEC", "PHURINE", "GLUCOSEU", "HGBUR", "BILIRUBINUR", "KETONESUR", "PROTEINUR", "UROBILINOGEN", "NITRITE", "LEUKOCYTESUR" in the last 72 hours.  Invalid input(s): "APPERANCEUR"    Imaging: ECHOCARDIOGRAM LIMITED  Result Date: 10/14/2021    ECHOCARDIOGRAM LIMITED REPORT   Patient Name:   Zoe Boyle Date of Exam: 10/14/2021 Medical Rec #:  [id number]  Height:       60.0 in Accession #:    [id number] Weight:       118.4 lb Date of Birth:  02/13/1940  BSA:          1.494 m Patient Age:    81 years   BP:           107/72 mmHg Patient Gender: F          HR:           85 bpm. Exam Location:  [facility] Procedure: Limited Echo, Intracardiac Opacification Agent and Limited Color            Doppler Indications:     Acute Systolic CHF  History:         Patient has prior history of Echocardiogram examinations. CHF;                  Risk Factors:CKD, Dyslipidemia and Hypertension.  Sonographer:     L Thornton-Maynard Referring Phys:  Tristan Schroeder Diagnosing Phys: Isaias Cowman MD IMPRESSIONS  1. Left ventricular ejection fraction, by estimation, is 30 to 35%. The left ventricle has moderately decreased function. The left ventricle has no regional wall motion abnormalities. The left ventricular internal cavity size was mildly dilated.  2. Right ventricular systolic function is normal. The right ventricular size is normal.  3. Left atrial size was severely dilated.  4. Right atrial size was severely dilated.  5. The mitral valve is normal in structure. Severe mitral valve regurgitation. No evidence of mitral stenosis.  6. Tricuspid  valve regurgitation is severe.  7. The aortic valve is normal in structure. Aortic valve regurgitation is not visualized. No aortic stenosis is present.  8. The inferior vena cava is normal in size with greater than 50% respiratory variability, suggesting right atrial pressure of 3 mmHg. FINDINGS  Left Ventricle: Left ventricular ejection fraction, by estimation, is 30 to 35%. The left ventricle has moderately decreased function. The left ventricle has no regional wall motion abnormalities. Definity contrast agent was given IV to delineate the left ventricular endocardial borders. The left ventricular internal cavity size was mildly dilated. There is no left ventricular hypertrophy. Right Ventricle: The right ventricular size is normal. No increase in right ventricular wall  thickness. Right ventricular systolic function is normal. Left Atrium: Left atrial size was severely dilated. Right Atrium: Right atrial size was severely dilated. Pericardium: There is no evidence of pericardial effusion. Mitral Valve: The mitral valve is normal in structure. Severe mitral valve regurgitation. No evidence of mitral valve stenosis. MV peak gradient, 14.6 mmHg. The mean mitral valve gradient is 7.0 mmHg. Tricuspid Valve: The tricuspid valve is normal in structure. Tricuspid valve regurgitation is severe. No evidence of tricuspid stenosis. Aortic Valve: The aortic valve is normal in structure. Aortic valve regurgitation is not visualized. No aortic stenosis is present. Pulmonic Valve: The pulmonic valve was normal in structure. Pulmonic valve regurgitation is not visualized. No evidence of pulmonic stenosis. Aorta: The aortic root is normal in size and structure. Venous: The inferior vena cava is normal in size with greater than 50% respiratory variability, suggesting right atrial pressure of 3 mmHg. IAS/Shunts: No atrial level shunt detected by color flow Doppler. Additional Comments: There is a small pleural effusion. LEFT  VENTRICLE PLAX 2D LVIDd:         7.20 cm      Diastology LVIDs:         6.60 cm      LV e' medial:    4.29 cm/s LV PW:         0.80 cm      LV E/e' medial:  32.6 LV IVS:        0.90 cm      LV e' lateral:   7.01 cm/s LVOT diam:     1.80 cm      LV E/e' lateral: 20.0 LV SV:         11 LV SV Index:   7 LVOT Area:     2.54 cm  LV Volumes (MOD) LV vol d, MOD A2C: 184.0 ml LV vol d, MOD A4C: 186.0 ml LV vol s, MOD A2C: 115.0 ml LV vol s, MOD A4C: 129.0 ml LV SV MOD A2C:     69.0 ml LV SV MOD A4C:     186.0 ml LV SV MOD BP:      59.1 ml RIGHT VENTRICLE         IVC TAPSE (M-mode): 1.2 cm  IVC diam: 3.30 cm AORTIC VALVE LVOT Vmax:   48.90 cm/s LVOT Vmean:  29.200 cm/s LVOT VTI:    0.043 m MITRAL VALVE                TRICUSPID VALVE MV Area (PHT): 4.31 cm     TR Peak grad:   20.4 mmHg MV Area VTI:   0.27 cm     TR Vmax:        226.00 cm/s MV Peak grad:  14.6 mmHg MV Mean grad:  7.0 mmHg     SHUNTS MV Vmax:       1.91 m/s     Systemic VTI:  0.04 m MV Vmean:      122.0 cm/s   Systemic Diam: 1.80 cm MV Decel Time: 176 msec MV E velocity: 140.00 cm/s MV A velocity: 84.40 cm/s MV E/A ratio:  1.66 Isaias Cowman MD Electronically signed by Isaias Cowman MD Signature Date/Time: 10/14/2021/10:33:44 AM    Final      Medications:    furosemide (LASIX) 200 mg in dextrose 5 % 100 mL (2 mg/mL) infusion 4 mg/hr (10/14/21 0558)   promethazine (PHENERGAN) injection (IM or IVPB)      aspirin EC  81 mg Oral Daily  enoxaparin (LOVENOX) injection  30 mg Subcutaneous Q24H   feeding supplement  237 mL Oral TID BM   megestrol  320 mg Oral Daily   mirtazapine  15 mg Oral QHS   multivitamin with minerals  1 tablet Oral Daily   pravastatin  20 mg Oral Daily   acetaminophen **OR** acetaminophen, ondansetron **OR** ondansetron (ZOFRAN) IV, promethazine (PHENERGAN) injection (IM or IVPB)  Assessment/ Plan:  Zoe Boyle is a 81 y.o.  female with medical problems of chronic systolic CHF with EF of 25 to 30% from June  2023, hypertension, hyperlipidemia, chronic kidney disease, anemia, depression, hospitalization from strep bacteremia in July 2023,   was admitted on 10/11/2021 for CHF (congestive heart failure) ([place]) [I50.9] Acute on chronic congestive heart failure, unspecified heart failure type ([place]) [I50.9]   Acute kidney injury in the setting of multiple valvular abnormalities, severe systolic CHF and chronic kidney disease stage IIIb with baseline creatinine 1.45 from August 04, 2021/GFR 37, lower extremity edema with volume overload.   Creatinine remains elevated.  Urine output recorded of 400 mL with 2 conferences overnight.  Lower extremity edema improved.  Respiratory status has improved.  Remains on furosemide drip at 4 mg/h.  Currently prescribed spironolactone and torsemide for home management.  We will evaluate these medications at discharge.  Would recommend palliative care consult to evaluate goals of care.  Due to cardiac abnormalities and low EF, fluid management will prove difficult with elevated kidney function. Lab Results  Component Value Date   CREATININE 1.93 (H) 10/14/2021   CREATININE 1.84 (H) 10/13/2021   CREATININE 1.62 (H) 10/12/2021    Intake/Output Summary (Last 24 hours) at 10/14/2021 1330 Last data filed at 10/14/2021 1000 Gross per 24 hour  Intake 269.53 ml  Output 400 ml  Net -130.47 ml   2.  Acute on chronic congestive heart failure, unspecified heart failure type ([place]) [I50.9] 2D echo July 2023: LVEF 25 to 30%, global hypokinesis, moderately dilated left ventricular internal cavity, severely dilated left atrium, small pericardial effusion, severe mitral regurgitation, severe tricuspid regurgitation, moderately elevated pulmonary artery systolic pressure.  Cardiology is following and recommend CRT-D outpatient with Dr. Clayborn Bigness.  3.  Hypertension with chronic kidney disease stage IIIb.  Home regimen includes carvedilol, spironolactone, and torsemide.  All currently held.  Blood  pressure soft 96/72.  Patient currently on furosemide drip.  4. Anemia of chronic kidney disease Hemoglobin 12.2.  Microcytic.  Lab Results  Component Value Date   HGB 12.2 10/12/2021    Hemoglobin remains acceptable.    LOS: 3 Zoe Boyle 9/16/20231:30 PM

## 2021-10-14 NOTE — Evaluation (Signed)
Physical Therapy Evaluation Patient Details Name: Zoe Boyle MRN: [id number] DOB: Jan 15, 1941 Today's Date: 10/14/2021  History of Present Illness  Simranjit Thayer is a 81 y.o. female with medical history significant of Systolic CHF (EF 25 to 29% June 2023), HTN, HLD, CKD 3A, anemia, depression, hospitalized back in June from 6/11 to 6/15 with group A strep bacteremia and then in July with CHF exacerbation who presents to the ED for evaluation of a 3 to 4-day history of shortness of breath associated with lower extremity edema in spite of compliance with her home diuretic regimen.  She also reports adherence to her diet.  She denies chest pain, cough, fever or chills.  Clinical Impression  Pt received in bed agreeable to participate in therapy evaluation. Pt using SpO2 94% at 2L via [place]. Pt was Ind at household level activity participation. Pt participated in community level activities with family. Today's assessment revealed poor aerobic capacity, generalized weakness, dizziness with sit to stand position and fatigue with ambulation using RW. Pt fatigues easily 2/2 low EF. BP 121/91 after activities and O2 99% at RA. Pt demonstrates improved balance with RW. Pt will benefit from HHPT with 24 hour assistance from family after acute care to return to PLOF safely.      Recommendations for follow up therapy are one component of a multi-disciplinary discharge planning process, led by the attending physician.  Recommendations may be updated based on patient status, additional functional criteria and insurance authorization.  Follow Up Recommendations Home health PT      Assistance Recommended at Discharge Intermittent Supervision/Assistance  Patient can return home with the following  A little help with walking and/or transfers;A little help with bathing/dressing/bathroom;Assistance with cooking/housework;Direct supervision/assist for medications management;Direct supervision/assist for financial  management;Assist for transportation;Help with stairs or ramp for entrance    Equipment Recommendations Rolling walker (2 wheels);BSC/3in1;Wheelchair (measurements PT)  Recommendations for Other Services       Functional Status Assessment Patient has had a recent decline in their functional status and demonstrates the ability to make significant improvements in function in a reasonable and predictable amount of time.     Precautions / Restrictions Precautions Precautions: Fall Precaution Comments: Mod fall risk Restrictions Weight Bearing Restrictions: No      Mobility  Bed Mobility Overal bed mobility: Independent                  Transfers Overall transfer level: Needs assistance Equipment used: Rolling walker (2 wheels) Transfers: Sit to/from Stand, Bed to chair/wheelchair/BSC Sit to Stand: Min guard   Step pivot transfers: Min guard            Ambulation/Gait Ambulation/Gait assistance: Min guard Gait Distance (Feet): 57 Feet Assistive device: Rolling walker (2 wheels) Gait Pattern/deviations: Step-through pattern, Decreased stride length, Shuffle Gait velocity: dec        Stairs            Wheelchair Mobility    Modified Rankin (Stroke Patients Only)       Balance Overall balance assessment: Needs assistance Sitting-balance support: Bilateral upper extremity supported Sitting balance-Leahy Scale: Good     Standing balance support: Reliant on assistive device for balance, Bilateral upper extremity supported Standing balance-Leahy Scale: Fair Standing balance comment: pt struggling with balance 2/2 SOB, requires several standing rest breaks and tires easily Single Leg Stance - Right Leg: 3 Single Leg Stance - Left Leg: 4 Tandem Stance - Right Leg: 17 Tandem Stance - Left Leg: 16 [place] - Eyes Opened:  30 [place] - Eyes Closed: 20                 Pertinent Vitals/Pain Pain Assessment Pain Assessment: No/denies  pain Breathing: normal    Home Living Family/patient expects to be discharged to:: Private residence Living Arrangements: Children Available Help at Discharge: Family;Available 24 hours/day Type of Home: Apartment Home Access: Ramped entrance       Home Layout: One level Home Equipment: None      Prior Function Prior Level of Function : Independent/Modified Independent             Mobility Comments: MOD I-I community amb with no AD; no fall history ADLs Comments: Pt Mod I with BADL, she cooks for herself and family, assists with cleaning. Daughter performs shopping, driving     Hand Dominance        Extremity/Trunk Assessment   Upper Extremity Assessment Upper Extremity Assessment: Defer to OT evaluation    Lower Extremity Assessment Lower Extremity Assessment: Generalized weakness       Communication   Communication: No difficulties  Cognition Arousal/Alertness: Awake/alert Behavior During Therapy: WFL for tasks assessed/performed Overall Cognitive Status: Within Functional Limits for tasks assessed                                 General Comments: slow with replying due to weakness        General Comments      Exercises General Exercises - Lower Extremity Ankle Circles/Pumps: AROM, 10 reps, Seated Long Arc Quad: AROM, 10 reps, Seated Hip Flexion/Marching: AROM, 10 reps, Seated   Assessment/Plan    PT Assessment Patient needs continued PT services  PT Problem List Decreased strength;Decreased activity tolerance;Decreased balance;Decreased knowledge of use of DME;Decreased safety awareness       PT Treatment Interventions Gait training;Therapeutic exercise;Functional mobility training;Therapeutic activities;Balance training;Neuromuscular re-education;Patient/family education    PT Goals (Current goals can be found in the Care Plan section)  Acute Rehab PT Goals Patient Stated Goal: " I want get stronger and better." PT Goal  Formulation: With patient Time For Goal Achievement: 10/28/21 Potential to Achieve Goals: Fair    Frequency Min 2X/week     Co-evaluation               AM-PAC PT "6 Clicks" Mobility  Outcome Measure Help needed turning from your back to your side while in a flat bed without using bedrails?: None Help needed moving from lying on your back to sitting on the side of a flat bed without using bedrails?: None Help needed moving to and from a bed to a chair (including a wheelchair)?: A Little Help needed standing up from a chair using your arms (e.g., wheelchair or bedside chair)?: A Little Help needed to walk in hospital room?: A Little Help needed climbing 3-5 steps with a railing? : Total 6 Click Score: 18    End of Session Equipment Utilized During Treatment: Gait belt Activity Tolerance: Patient tolerated treatment well;Patient limited by fatigue Patient left: in chair;with call bell/phone within reach;with chair alarm set Nurse Communication: Mobility status PT Visit Diagnosis: Unsteadiness on feet (R26.81);Muscle weakness (generalized) (M62.81);Difficulty in walking, not elsewhere classified (R26.2)    Time: 8101-7510 PT Time Calculation (min) (ACUTE ONLY): 26 min   Charges:   PT Evaluation $PT Eval Moderate Complexity: 1 Mod PT Treatments $Gait Training: 8-22 mins $Therapeutic Activity: 8-22 mins       Tammela Bales  Blinda Leatherwood PT DPT 1:28 PM,10/14/21

## 2021-10-15 DIAGNOSIS — E43 Unspecified severe protein-calorie malnutrition: Secondary | ICD-10-CM | POA: Diagnosis present

## 2021-10-15 DIAGNOSIS — I5043 Acute on chronic combined systolic (congestive) and diastolic (congestive) heart failure: Secondary | ICD-10-CM | POA: Diagnosis not present

## 2021-10-15 LAB — CBC
HCT: 36.6 % (ref 36.0–46.0)
Hemoglobin: 12.2 g/dL (ref 12.0–15.0)
MCH: 27.2 pg (ref 26.0–34.0)
MCHC: 33.3 g/dL (ref 30.0–36.0)
MCV: 81.5 fL (ref 80.0–100.0)
Platelets: 140 10*3/uL — ABNORMAL LOW (ref 150–400)
RBC: 4.49 MIL/uL (ref 3.87–5.11)
RDW: 17.2 % — ABNORMAL HIGH (ref 11.5–15.5)
WBC: 4.3 10*3/uL (ref 4.0–10.5)
nRBC: 1.6 % — ABNORMAL HIGH (ref 0.0–0.2)

## 2021-10-15 LAB — BASIC METABOLIC PANEL
Anion gap: 12 (ref 5–15)
BUN: 65 mg/dL — ABNORMAL HIGH (ref 8–23)
CO2: 16 mmol/L — ABNORMAL LOW (ref 22–32)
Calcium: 9.4 mg/dL (ref 8.9–10.3)
Chloride: 110 mmol/L (ref 98–111)
Creatinine, Ser: 1.87 mg/dL — ABNORMAL HIGH (ref 0.44–1.00)
GFR, Estimated: 27 mL/min — ABNORMAL LOW (ref >60)
Glucose, Bld: 102 mg/dL — ABNORMAL HIGH (ref 70–99)
Potassium: 4.9 mmol/L (ref 3.5–5.1)
Sodium: 138 mmol/L (ref 135–145)

## 2021-10-15 MED ORDER — ORAL CARE MOUTH RINSE: 15.0000 mL | OROMUCOSAL | Status: DC | PRN

## 2021-10-15 MED ORDER — ALBUMIN HUMAN 25 % IV SOLN
12.5000 g | Freq: Two times a day (BID) | INTRAVENOUS | Status: DC
  Administered 2021-10-15 – 2021-10-16 (×3): 12.5 g via INTRAVENOUS
  Filled 2021-10-15 (×3): qty 50

## 2021-10-15 NOTE — Progress Notes (Signed)
[facility]  ROUNDING NOTE   Subjective:   Patient seen sitting up in chair, daughter at bedside Alert and oriented Daughter states appetite has remained poor since patient had stomach virus a few weeks prior. Weaned to room air Lower extremity edema remains, but improved  Creatinine 1.87 Urine output 550 mL in preceding 24 hours. Furosemide drip at 4 mg/h  Objective:  Vital signs in last 24 hours:  Temp:  [97.5 F (36.4 C)-98.7 F (37.1 C)] 97.6 F (36.4 C) (09/17 1307) Pulse Rate:  [56-74] 70 (09/17 0909) Resp:  [16-28] 28 (09/17 1307) BP: (93-112)/(70-94) 95/79 (09/17 1307) SpO2:  [94 %-100 %] 96 % (09/17 0909) Weight:  [54.4 kg] 54.4 kg (09/17 0432)  Weight change: 0.7 kg Filed Weights   10/13/21 0634 10/14/21 0515 10/15/21 0432  Weight: 54.1 kg 53.7 kg 54.4 kg    Intake/Output: I/O last 3 completed shifts: In: 429.3 [P.O.:360; I.V.:69.3] Out: 950 [Urine:950]   Intake/Output this shift:  Total I/O In: 426 [P.O.:360; I.V.:16; IV Piggyback:50] Out: -   Physical Exam: General: NAD, sitting in chair  Head: Normocephalic, atraumatic.  Dry oral mucosal membranes  Eyes: Anicteric  Lungs:  Diminished in bases, normal effort  Heart: Regular rate and rhythm  Abdomen:  Soft, nontender  Extremities:  1+ peripheral edema.  Neurologic: Nonfocal, moving all four extremities  Skin: No lesions  Access: None    Basic Metabolic Panel: Recent Labs  Lab 10/11/21 1712 10/12/21 0411 10/12/21 1127 10/13/21 0604 10/14/21 0749 10/15/21 0517  NA 137  --   --  138 139 138  K 3.4*  --  3.0* 4.9 4.8 4.9  CL 106  --   --  109 111 110  CO2 18*  --   --  18* 18* 16*  GLUCOSE 160*  --   --  118* 144* 102*  BUN 53*  --   --  57* 63* 65*  CREATININE 1.72* 1.62*  --  1.84* 1.93* 1.87*  CALCIUM 8.9  --   --  9.1 9.4 9.4  MG  --   --   --  2.2  --   --      Liver Function Tests: No results for input(s): "AST", "ALT", "ALKPHOS", "BILITOT", "PROT", "ALBUMIN" in  the last 168 hours. No results for input(s): "LIPASE", "AMYLASE" in the last 168 hours. No results for input(s): "AMMONIA" in the last 168 hours.  CBC: Recent Labs  Lab 10/11/21 1712 10/12/21 0411 10/14/21 0752 10/15/21 0517  WBC 4.7 4.7 4.7 4.3  HGB 12.0 12.2 12.2 12.2  HCT 36.4 35.9* 38.5 36.6  MCV 80.5 79.2* 81.9 81.5  PLT 86* 80* 119* 140*     Cardiac Enzymes: No results for input(s): "CKTOTAL", "CKMB", "CKMBINDEX", "TROPONINI" in the last 168 hours.  BNP: Invalid input(s): "POCBNP"  CBG: No results for input(s): "GLUCAP" in the last 168 hours.  Microbiology: Results for orders placed or performed during the hospital encounter of 09/12/21  SARS Coronavirus 2 by RT PCR (hospital order, performed in [facility] hospital lab) *cepheid single result test* Anterior Nasal Swab     Status: None   Collection Time: 09/12/21  5:23 AM   Specimen: Anterior Nasal Swab  Result Value Ref Range Status   SARS Coronavirus 2 by RT PCR NEGATIVE NEGATIVE Final    Comment: (NOTE) SARS-CoV-2 target nucleic acids are NOT DETECTED.  The SARS-CoV-2 RNA is generally detectable in upper and lower respiratory specimens during the acute phase of infection. The  lowest concentration of SARS-CoV-2 viral copies this assay can detect is 250 copies / mL. A negative result does not preclude SARS-CoV-2 infection and should not be used as the sole basis for treatment or other patient management decisions.  A negative result may occur with improper specimen collection / handling, submission of specimen other than nasopharyngeal swab, presence of viral mutation(s) within the areas targeted by this assay, and inadequate number of viral copies (<250 copies / mL). A negative result must be combined with clinical observations, patient history, and epidemiological information.  Fact Sheet for Patients:   https://www.patel.info/  Fact Sheet for Healthcare  Providers: https://hall.com/  This test is not yet approved or  cleared by the [place] FDA and has been authorized for detection and/or diagnosis of SARS-CoV-2 by FDA under an Emergency Use Authorization (EUA).  This EUA will remain in effect (meaning this test can be used) for the duration of the COVID-19 declaration under Section 564(b)(1) of the Act, 21 U.S.C. section 360bbb-3(b)(1), unless the authorization is terminated or revoked sooner.  Performed at [facility], [address] [place], [place] [address]     Coagulation Studies: No results for input(s): "LABPROT", "INR" in the last 72 hours.  Urinalysis: No results for input(s): "COLORURINE", "LABSPEC", "PHURINE", "GLUCOSEU", "HGBUR", "BILIRUBINUR", "KETONESUR", "PROTEINUR", "UROBILINOGEN", "NITRITE", "LEUKOCYTESUR" in the last 72 hours.  Invalid input(s): "APPERANCEUR"    Imaging: ECHOCARDIOGRAM LIMITED  Result Date: 10/14/2021    ECHOCARDIOGRAM LIMITED REPORT   Patient Name:   Zoe Boyle Date of Exam: 10/14/2021 Medical Rec #:  [id number]  Height:       60.0 in Accession #:    [id number] Weight:       118.4 lb Date of Birth:  1940-11-22  BSA:          1.494 m Patient Age:    81 years   BP:           107/72 mmHg Patient Gender: F          HR:           85 bpm. Exam Location:  [facility] Procedure: Limited Echo, Intracardiac Opacification Agent and Limited Color            Doppler Indications:     Acute Systolic CHF  History:         Patient has prior history of Echocardiogram examinations. CHF;                  Risk Factors:CKD, Dyslipidemia and Hypertension.  Sonographer:     L Thornton-Maynard Referring Phys:  Tristan Schroeder Diagnosing Phys: Isaias Cowman MD IMPRESSIONS  1. Left ventricular ejection fraction, by estimation, is 30 to 35%. The left ventricle has moderately decreased function. The left ventricle has no regional wall motion abnormalities. The left ventricular internal  cavity size was mildly dilated.  2. Right ventricular systolic function is normal. The right ventricular size is normal.  3. Left atrial size was severely dilated.  4. Right atrial size was severely dilated.  5. The mitral valve is normal in structure. Severe mitral valve regurgitation. No evidence of mitral stenosis.  6. Tricuspid valve regurgitation is severe.  7. The aortic valve is normal in structure. Aortic valve regurgitation is not visualized. No aortic stenosis is present.  8. The inferior vena cava is normal in size with greater than 50% respiratory variability, suggesting right atrial pressure of 3 mmHg. FINDINGS  Left Ventricle: Left ventricular ejection fraction, by estimation, is 30  to 35%. The left ventricle has moderately decreased function. The left ventricle has no regional wall motion abnormalities. Definity contrast agent was given IV to delineate the left ventricular endocardial borders. The left ventricular internal cavity size was mildly dilated. There is no left ventricular hypertrophy. Right Ventricle: The right ventricular size is normal. No increase in right ventricular wall thickness. Right ventricular systolic function is normal. Left Atrium: Left atrial size was severely dilated. Right Atrium: Right atrial size was severely dilated. Pericardium: There is no evidence of pericardial effusion. Mitral Valve: The mitral valve is normal in structure. Severe mitral valve regurgitation. No evidence of mitral valve stenosis. MV peak gradient, 14.6 mmHg. The mean mitral valve gradient is 7.0 mmHg. Tricuspid Valve: The tricuspid valve is normal in structure. Tricuspid valve regurgitation is severe. No evidence of tricuspid stenosis. Aortic Valve: The aortic valve is normal in structure. Aortic valve regurgitation is not visualized. No aortic stenosis is present. Pulmonic Valve: The pulmonic valve was normal in structure. Pulmonic valve regurgitation is not visualized. No evidence of pulmonic  stenosis. Aorta: The aortic root is normal in size and structure. Venous: The inferior vena cava is normal in size with greater than 50% respiratory variability, suggesting right atrial pressure of 3 mmHg. IAS/Shunts: No atrial level shunt detected by color flow Doppler. Additional Comments: There is a small pleural effusion. LEFT VENTRICLE PLAX 2D LVIDd:         7.20 cm      Diastology LVIDs:         6.60 cm      LV e' medial:    4.29 cm/s LV PW:         0.80 cm      LV E/e' medial:  32.6 LV IVS:        0.90 cm      LV e' lateral:   7.01 cm/s LVOT diam:     1.80 cm      LV E/e' lateral: 20.0 LV SV:         11 LV SV Index:   7 LVOT Area:     2.54 cm  LV Volumes (MOD) LV vol d, MOD A2C: 184.0 ml LV vol d, MOD A4C: 186.0 ml LV vol s, MOD A2C: 115.0 ml LV vol s, MOD A4C: 129.0 ml LV SV MOD A2C:     69.0 ml LV SV MOD A4C:     186.0 ml LV SV MOD BP:      59.1 ml RIGHT VENTRICLE         IVC TAPSE (M-mode): 1.2 cm  IVC diam: 3.30 cm AORTIC VALVE LVOT Vmax:   48.90 cm/s LVOT Vmean:  29.200 cm/s LVOT VTI:    0.043 m MITRAL VALVE                TRICUSPID VALVE MV Area (PHT): 4.31 cm     TR Peak grad:   20.4 mmHg MV Area VTI:   0.27 cm     TR Vmax:        226.00 cm/s MV Peak grad:  14.6 mmHg MV Mean grad:  7.0 mmHg     SHUNTS MV Vmax:       1.91 m/s     Systemic VTI:  0.04 m MV Vmean:      122.0 cm/s   Systemic Diam: 1.80 cm MV Decel Time: 176 msec MV E velocity: 140.00 cm/s MV A velocity: 84.40 cm/s MV E/A ratio:  1.66 Isaias Cowman MD Electronically  signed by Isaias Cowman MD Signature Date/Time: 10/14/2021/10:33:44 AM    Final      Medications:    albumin human Stopped (10/15/21 1250)   furosemide (LASIX) 200 mg in dextrose 5 % 100 mL (2 mg/mL) infusion Stopped (10/15/21 1258)   promethazine (PHENERGAN) injection (IM or IVPB)      acidophilus  1 capsule Oral Daily   aspirin EC  81 mg Oral Daily   enoxaparin (LOVENOX) injection  30 mg Subcutaneous Q24H   feeding supplement  237 mL Oral TID BM    megestrol  320 mg Oral Daily   mirtazapine  15 mg Oral QHS   multivitamin with minerals  1 tablet Oral Daily   pravastatin  20 mg Oral Daily   acetaminophen **OR** acetaminophen, ondansetron **OR** ondansetron (ZOFRAN) IV, mouth rinse, promethazine (PHENERGAN) injection (IM or IVPB)  Assessment/ Plan:  Ms. Zoe Boyle is a 81 y.o.  female with medical problems of chronic systolic CHF with EF of 25 to 30% from June 2023, hypertension, hyperlipidemia, chronic kidney disease, anemia, depression, hospitalization from strep bacteremia in July 2023,   was admitted on 10/11/2021 for CHF (congestive heart failure) ([place]) [I50.9] Acute on chronic congestive heart failure, unspecified heart failure type ([place]) [I50.9]   Acute kidney injury in the setting of multiple valvular abnormalities, severe systolic CHF and chronic kidney disease stage IIIb with baseline creatinine 1.45 from August 04, 2021/GFR 37, lower extremity edema with volume overload.   Creatinine shows little improvement with little urine output recorded from continue with diuresis.  Lower extremity edema improved but remains.  Patient encouraged to increase oral intake slowly.  We will continue to recommend palliative care consult to evaluate goals of care. Lab Results  Component Value Date   CREATININE 1.87 (H) 10/15/2021   CREATININE 1.93 (H) 10/14/2021   CREATININE 1.84 (H) 10/13/2021    Intake/Output Summary (Last 24 hours) at 10/15/2021 1349 Last data filed at 10/15/2021 1300 Gross per 24 hour  Intake 589.32 ml  Output 550 ml  Net 39.32 ml    2.  Acute on chronic congestive heart failure, unspecified heart failure type ([place]) [I50.9] 2D echo July 2023: LVEF 25 to 30%, global hypokinesis, moderately dilated left ventricular internal cavity, severely dilated left atrium, small pericardial effusion, severe mitral regurgitation, severe tricuspid regurgitation, moderately elevated pulmonary artery systolic pressure.  Cardiology is following  and recommend CRT-D outpatient with Dr. Clayborn Bigness.  Currently managing fluid volume with furosemide drip.  Will increase furosemide drip to 6 mg/h in order albumin twice daily to optimize fluid removal.  We will reassess patient tomorrow.  3.  Hypertension with chronic kidney disease stage IIIb.  Home regimen includes carvedilol, spironolactone, and torsemide.  All currently held.    Blood pressure remains soft 95/79.  We will monitor this with furosemide drip.  4. Anemia of chronic kidney disease Normocytic Lab Results  Component Value Date   HGB 12.2 10/15/2021    Hemoglobin at goal.   LOS: [place] 9/17/20231:49 PM

## 2021-10-15 NOTE — Progress Notes (Addendum)
PROGRESS NOTE    Zoe Boyle  FWY:[id number] DOB: Jun 04, 1940 DOA: 10/11/2021 PCP: Gladstone Lighter, MD   Brief Narrative:  Patient is a 81 year old female with history of systolic CHF with EF of 25 to 30%, hypertension, hyperlipidemia, CKD stage III, anemia, depression who presented from home with complaint of 3 to 4 days history of shortness of breath, lower extremity edema.  She reported compliance with her home diuretic regimen.  On presentation,she was mildly hypotensive, tachypneic, saturating fine on room air.  Chest x-ray showed cardiac enlargement, bibasilar moderate disease/infiltrate, mild vascular congestion.  Patient was admitted for the management of acute on chronic sCHF exacerbation.  Patient was admitted for IV diuresis.  Due to worsening kidney function, cardiology, nephrology consulted.  Assessment & Plan:   Acute on chronic combined systolic/diastolic congestive heart failure:  -Echo showed ejection fraction of 25 to 30%.  Currently on furosemide infusion -Cardiology on board-appreciate help  -Monitor input/output, daily weight. -Cardiology recommended to start empagliflozin once renal status stabilizes and can be done outpatient.  Recommended outpatient evaluation for CRT-D per Dr. Clayborn Bigness  Elevated troponin: Likely demand ischemia in the setting of worsening kidney function.  Patient denies any chest pain.  AKI on CKD stage IIIa: Creatinine was 0.8 on 6/15 /23.  Creatinine bumped up to 1.8.  Nephrology consulted-on furosemide infusion-appreciate help -Creatinine: 1.87 this morning.  -Improving.  Continue to monitor  Loose stool: -Per patient-she had no diarrhea this morning.  Improving overall -On Zofran and Phenergan as needed for nausea and vomiting.  Probiotics added. -She is afebrile, no abdominal tenderness noted on exam.  No leukocytosis.   Elevated troponin: Denies any chest pain, flat trend.  Likely from demand ischemia from CHF exacerbation.  No further  work-up indicated   Hypertension:Blood pressure soft so coreg, spironolactone on hold   Depression: Continue mirtazapine   Hyperlipidemia: Continue statin   Hypokalemia: Resolved  Thrombocytopenia: Chronic,   Continue to monitor.   Weakness/decreased oral intake: Nutritionist consulted.  on Megace.  -PT recommended home health PT  Patient is at increased risk for morbidity and mortality due to multiple underlying medical problems.  I consulted palliative care to discuss goals of care with patient and her family.  DVT prophylaxis: Lovenox Code Status: Full code-confirm with the patient. Family Communication: None present at bedside.  Plan of care discussed with patient in length and she verbalized understanding and agreed with it.  I called patient's daughter on 10/14/2021 and on 10/15/21 & discussed plan of care.  Disposition Plan: To be determined  Consultants:  Neurology Nephrology  Procedures:  None  Antimicrobials:  None none  Status is: Inpatient    Subjective: Patient seen and examined.  Resting comfortably on the bed.  Reports that she feels better this morning.  She denies diarrhea.  Nausea has improved.  Breathing has improved.  No acute events overnight.  Objective: Vitals:   10/15/21 0432 10/15/21 0453 10/15/21 0453 10/15/21 0909  BP:  93/70 93/70 106/72  Pulse:  (!) 56 (!) 56 70  Resp:  18 18 16   Temp:  98.7 F (37.1 C) 98.7 F (37.1 C) (!) 97.5 F (36.4 C)  TempSrc:  Oral Oral Oral  SpO2:  94% 94% 96%  Weight: 54.4 kg     Height:        Intake/Output Summary (Last 24 hours) at 10/15/2021 1027 Last data filed at 10/15/2021 0530 Gross per 24 hour  Intake 163.34 ml  Output 550 ml  Net -386.66 ml  Filed Weights   10/13/21 0634 10/14/21 0515 10/15/21 0432  Weight: 54.1 kg 53.7 kg 54.4 kg    Examination:  General exam: Appears calm and comfortable, elderly female, has nasal cannula, appears weak  Respiratory system: No wheezing, rhonchi  or crackles.  Decreased breath sounds on the basis  cardiovascular system: S1 & S2 heard, RRR. No JVD, murmurs, rubs, gallops or clicks.  Bilateral 1+ pitting edema positive  gastrointestinal system: Abdomen is nondistended, soft and nontender. No organomegaly or masses felt. Normal bowel sounds heard. Central nervous system: Alert and oriented. No focal neurological deficits. Extremities: Symmetric 5 x 5 power. Skin: No rashes, lesions or ulcers Psychiatry: Judgement and insight appear normal. Mood & affect appropriate.    Data Reviewed: I have personally reviewed following labs and imaging studies  CBC: Recent Labs  Lab 10/11/21 1712 10/12/21 0411 10/14/21 0752 10/15/21 0517  WBC 4.7 4.7 4.7 4.3  HGB 12.0 12.2 12.2 12.2  HCT 36.4 35.9* 38.5 36.6  MCV 80.5 79.2* 81.9 81.5  PLT 86* 80* 119* 140*    Basic Metabolic Panel: Recent Labs  Lab 10/11/21 1712 10/12/21 0411 10/12/21 1127 10/13/21 0604 10/14/21 0749 10/15/21 0517  NA 137  --   --  138 139 138  K 3.4*  --  3.0* 4.9 4.8 4.9  CL 106  --   --  109 111 110  CO2 18*  --   --  18* 18* 16*  GLUCOSE 160*  --   --  118* 144* 102*  BUN 53*  --   --  57* 63* 65*  CREATININE 1.72* 1.62*  --  1.84* 1.93* 1.87*  CALCIUM 8.9  --   --  9.1 9.4 9.4  MG  --   --   --  2.2  --   --     GFR: Estimated Creatinine Clearance: 17.5 mL/min (A) (by C-G formula based on SCr of 1.87 mg/dL (H)). Liver Function Tests: No results for input(s): "AST", "ALT", "ALKPHOS", "BILITOT", "PROT", "ALBUMIN" in the last 168 hours. No results for input(s): "LIPASE", "AMYLASE" in the last 168 hours. No results for input(s): "AMMONIA" in the last 168 hours. Coagulation Profile: No results for input(s): "INR", "PROTIME" in the last 168 hours. Cardiac Enzymes: No results for input(s): "CKTOTAL", "CKMB", "CKMBINDEX", "TROPONINI" in the last 168 hours. BNP (last 3 results) No results for input(s): "PROBNP" in the last 8760 hours. HbA1C: No results for  input(s): "HGBA1C" in the last 72 hours. CBG: No results for input(s): "GLUCAP" in the last 168 hours. Lipid Profile: No results for input(s): "CHOL", "HDL", "LDLCALC", "TRIG", "CHOLHDL", "LDLDIRECT" in the last 72 hours. Thyroid Function Tests: No results for input(s): "TSH", "T4TOTAL", "FREET4", "T3FREE", "THYROIDAB" in the last 72 hours. Anemia Panel: No results for input(s): "VITAMINB12", "FOLATE", "FERRITIN", "TIBC", "IRON", "RETICCTPCT" in the last 72 hours. Sepsis Labs: No results for input(s): "PROCALCITON", "LATICACIDVEN" in the last 168 hours.  No results found for this or any previous visit (from the past 240 hour(s)).    Radiology Studies: ECHOCARDIOGRAM LIMITED  Result Date: 10/14/2021    ECHOCARDIOGRAM LIMITED REPORT   Patient Name:   Zoe Boyle Date of Exam: 10/14/2021 Medical Rec #:  [id number]  Height:       60.0 in Accession #:    [id number] Weight:       118.4 lb Date of Birth:  Jun 13, 1940  BSA:          1.494 m Patient Age:    34 years  BP:           107/72 mmHg Patient Gender: F          HR:           85 bpm. Exam Location:  [facility] Procedure: Limited Echo, Intracardiac Opacification Agent and Limited Color            Doppler Indications:     Acute Systolic CHF  History:         Patient has prior history of Echocardiogram examinations. CHF;                  Risk Factors:CKD, Dyslipidemia and Hypertension.  Sonographer:     L Thornton-Maynard Referring Phys:  Rebeca Allegra Diagnosing Phys: Marcina Millard MD IMPRESSIONS  1. Left ventricular ejection fraction, by estimation, is 30 to 35%. The left ventricle has moderately decreased function. The left ventricle has no regional wall motion abnormalities. The left ventricular internal cavity size was mildly dilated.  2. Right ventricular systolic function is normal. The right ventricular size is normal.  3. Left atrial size was severely dilated.  4. Right atrial size was severely dilated.  5. The mitral valve is normal in  structure. Severe mitral valve regurgitation. No evidence of mitral stenosis.  6. Tricuspid valve regurgitation is severe.  7. The aortic valve is normal in structure. Aortic valve regurgitation is not visualized. No aortic stenosis is present.  8. The inferior vena cava is normal in size with greater than 50% respiratory variability, suggesting right atrial pressure of 3 mmHg. FINDINGS  Left Ventricle: Left ventricular ejection fraction, by estimation, is 30 to 35%. The left ventricle has moderately decreased function. The left ventricle has no regional wall motion abnormalities. Definity contrast agent was given IV to delineate the left ventricular endocardial borders. The left ventricular internal cavity size was mildly dilated. There is no left ventricular hypertrophy. Right Ventricle: The right ventricular size is normal. No increase in right ventricular wall thickness. Right ventricular systolic function is normal. Left Atrium: Left atrial size was severely dilated. Right Atrium: Right atrial size was severely dilated. Pericardium: There is no evidence of pericardial effusion. Mitral Valve: The mitral valve is normal in structure. Severe mitral valve regurgitation. No evidence of mitral valve stenosis. MV peak gradient, 14.6 mmHg. The mean mitral valve gradient is 7.0 mmHg. Tricuspid Valve: The tricuspid valve is normal in structure. Tricuspid valve regurgitation is severe. No evidence of tricuspid stenosis. Aortic Valve: The aortic valve is normal in structure. Aortic valve regurgitation is not visualized. No aortic stenosis is present. Pulmonic Valve: The pulmonic valve was normal in structure. Pulmonic valve regurgitation is not visualized. No evidence of pulmonic stenosis. Aorta: The aortic root is normal in size and structure. Venous: The inferior vena cava is normal in size with greater than 50% respiratory variability, suggesting right atrial pressure of 3 mmHg. IAS/Shunts: No atrial level shunt  detected by color flow Doppler. Additional Comments: There is a small pleural effusion. LEFT VENTRICLE PLAX 2D LVIDd:         7.20 cm      Diastology LVIDs:         6.60 cm      LV e' medial:    4.29 cm/s LV PW:         0.80 cm      LV E/e' medial:  32.6 LV IVS:        0.90 cm      LV e' lateral:  7.01 cm/s LVOT diam:     1.80 cm      LV E/e' lateral: 20.0 LV SV:         11 LV SV Index:   7 LVOT Area:     2.54 cm  LV Volumes (MOD) LV vol d, MOD A2C: 184.0 ml LV vol d, MOD A4C: 186.0 ml LV vol s, MOD A2C: 115.0 ml LV vol s, MOD A4C: 129.0 ml LV SV MOD A2C:     69.0 ml LV SV MOD A4C:     186.0 ml LV SV MOD BP:      59.1 ml RIGHT VENTRICLE         IVC TAPSE (M-mode): 1.2 cm  IVC diam: 3.30 cm AORTIC VALVE LVOT Vmax:   48.90 cm/s LVOT Vmean:  29.200 cm/s LVOT VTI:    0.043 m MITRAL VALVE                TRICUSPID VALVE MV Area (PHT): 4.31 cm     TR Peak grad:   20.4 mmHg MV Area VTI:   0.27 cm     TR Vmax:        226.00 cm/s MV Peak grad:  14.6 mmHg MV Mean grad:  7.0 mmHg     SHUNTS MV Vmax:       1.91 m/s     Systemic VTI:  0.04 m MV Vmean:      122.0 cm/s   Systemic Diam: 1.80 cm MV Decel Time: 176 msec MV E velocity: 140.00 cm/s MV A velocity: 84.40 cm/s MV E/A ratio:  1.66 Isaias Cowman MD Electronically signed by Isaias Cowman MD Signature Date/Time: 10/14/2021/10:33:44 AM    Final     Scheduled Meds:  acidophilus  1 capsule Oral Daily   aspirin EC  81 mg Oral Daily   enoxaparin (LOVENOX) injection  30 mg Subcutaneous Q24H   feeding supplement  237 mL Oral TID BM   megestrol  320 mg Oral Daily   mirtazapine  15 mg Oral QHS   multivitamin with minerals  1 tablet Oral Daily   pravastatin  20 mg Oral Daily   Continuous Infusions:  furosemide (LASIX) 200 mg in dextrose 5 % 100 mL (2 mg/mL) infusion 4 mg/hr (10/14/21 1900)   promethazine (PHENERGAN) injection (IM or IVPB)       LOS: 4 days   Time spent: 35 minutes   Eymi Lipuma Loann Quill, MD [facility]  If 7PM-7AM, please  contact night-coverage [email or web address] 10/15/2021, 10:27 AM

## 2021-10-15 NOTE — Progress Notes (Signed)
[facility] Cardiology  SUBJECTIVE: Patient laying in bed, denies chest pain, reports improved breathing   Vitals:   10/15/21 0432 10/15/21 0453 10/15/21 0453 10/15/21 0909  BP:  93/70 93/70 106/72  Pulse:  (!) 56 (!) 56 70  Resp:  18 18 16   Temp:  98.7 F (37.1 C) 98.7 F (37.1 C) (!) 97.5 F (36.4 C)  TempSrc:  Oral Oral Oral  SpO2:  94% 94% 96%  Weight: 54.4 kg     Height:         Intake/Output Summary (Last 24 hours) at 10/15/2021 [id number] Last data filed at 10/15/2021 0530 Gross per 24 hour  Intake 403.34 ml  Output 550 ml  Net -146.66 ml      PHYSICAL EXAM  General: Well developed, well nourished, in no acute distress HEENT:  Normocephalic and atramatic Neck:  No JVD.  Lungs: Clear bilaterally to auscultation and percussion. Heart: HRRR . Normal S1 and S2 without gallops or murmurs.  Abdomen: Bowel sounds are positive, abdomen soft and non-tender  Msk:  Back normal, normal gait. Normal strength and tone for age. Extremities: No clubbing, cyanosis or edema.   Neuro: Alert and oriented X 3. Psych:  Good affect, responds appropriately   LABS: Basic Metabolic Panel: Recent Labs    10/13/21 0604 10/14/21 0749 10/15/21 0517  NA 138 139 138  K 4.9 4.8 4.9  CL 109 111 110  CO2 18* 18* 16*  GLUCOSE 118* 144* 102*  BUN 57* 63* 65*  CREATININE 1.84* 1.93* 1.87*  CALCIUM 9.1 9.4 9.4  MG 2.2  --   --    Liver Function Tests: No results for input(s): "AST", "ALT", "ALKPHOS", "BILITOT", "PROT", "ALBUMIN" in the last 72 hours. No results for input(s): "LIPASE", "AMYLASE" in the last 72 hours. CBC: Recent Labs    10/14/21 0752 10/15/21 0517  WBC 4.7 4.3  HGB 12.2 12.2  HCT 38.5 36.6  MCV 81.9 81.5  PLT 119* 140*   Cardiac Enzymes: No results for input(s): "CKTOTAL", "CKMB", "CKMBINDEX", "TROPONINI" in the last 72 hours. BNP: Invalid input(s): "POCBNP" D-Dimer: No results for input(s): "DDIMER" in the last 72 hours. Hemoglobin A1C: No results for input(s):  "HGBA1C" in the last 72 hours. Fasting Lipid Panel: No results for input(s): "CHOL", "HDL", "LDLCALC", "TRIG", "CHOLHDL", "LDLDIRECT" in the last 72 hours. Thyroid Function Tests: No results for input(s): "TSH", "T4TOTAL", "T3FREE", "THYROIDAB" in the last 72 hours.  Invalid input(s): "FREET3" Anemia Panel: No results for input(s): "VITAMINB12", "FOLATE", "FERRITIN", "TIBC", "IRON", "RETICCTPCT" in the last 72 hours.  ECHOCARDIOGRAM LIMITED  Result Date: 10/14/2021    ECHOCARDIOGRAM LIMITED REPORT   Patient Name:   Zoe Boyle Date of Exam: 10/14/2021 Medical Rec #:  [id number]  Height:       60.0 in Accession #:    [id number] Weight:       118.4 lb Date of Birth:  1940/07/09  BSA:          1.494 m Patient Age:    81 years   BP:           107/72 mmHg Patient Gender: F          HR:           85 bpm. Exam Location:  [facility] Procedure: Limited Echo, Intracardiac Opacification Agent and Limited Color            Doppler Indications:     Acute Systolic CHF  History:  Patient has prior history of Echocardiogram examinations. CHF;                  Risk Factors:CKD, Dyslipidemia and Hypertension.  Sonographer:     L Thornton-Maynard Referring Phys:  Tristan Schroeder Diagnosing Phys: Isaias Cowman MD IMPRESSIONS  1. Left ventricular ejection fraction, by estimation, is 30 to 35%. The left ventricle has moderately decreased function. The left ventricle has no regional wall motion abnormalities. The left ventricular internal cavity size was mildly dilated.  2. Right ventricular systolic function is normal. The right ventricular size is normal.  3. Left atrial size was severely dilated.  4. Right atrial size was severely dilated.  5. The mitral valve is normal in structure. Severe mitral valve regurgitation. No evidence of mitral stenosis.  6. Tricuspid valve regurgitation is severe.  7. The aortic valve is normal in structure. Aortic valve regurgitation is not visualized. No aortic stenosis is present.  8.  The inferior vena cava is normal in size with greater than 50% respiratory variability, suggesting right atrial pressure of 3 mmHg. FINDINGS  Left Ventricle: Left ventricular ejection fraction, by estimation, is 30 to 35%. The left ventricle has moderately decreased function. The left ventricle has no regional wall motion abnormalities. Definity contrast agent was given IV to delineate the left ventricular endocardial borders. The left ventricular internal cavity size was mildly dilated. There is no left ventricular hypertrophy. Right Ventricle: The right ventricular size is normal. No increase in right ventricular wall thickness. Right ventricular systolic function is normal. Left Atrium: Left atrial size was severely dilated. Right Atrium: Right atrial size was severely dilated. Pericardium: There is no evidence of pericardial effusion. Mitral Valve: The mitral valve is normal in structure. Severe mitral valve regurgitation. No evidence of mitral valve stenosis. MV peak gradient, 14.6 mmHg. The mean mitral valve gradient is 7.0 mmHg. Tricuspid Valve: The tricuspid valve is normal in structure. Tricuspid valve regurgitation is severe. No evidence of tricuspid stenosis. Aortic Valve: The aortic valve is normal in structure. Aortic valve regurgitation is not visualized. No aortic stenosis is present. Pulmonic Valve: The pulmonic valve was normal in structure. Pulmonic valve regurgitation is not visualized. No evidence of pulmonic stenosis. Aorta: The aortic root is normal in size and structure. Venous: The inferior vena cava is normal in size with greater than 50% respiratory variability, suggesting right atrial pressure of 3 mmHg. IAS/Shunts: No atrial level shunt detected by color flow Doppler. Additional Comments: There is a small pleural effusion. LEFT VENTRICLE PLAX 2D LVIDd:         7.20 cm      Diastology LVIDs:         6.60 cm      LV e' medial:    4.29 cm/s LV PW:         0.80 cm      LV E/e' medial:  32.6  LV IVS:        0.90 cm      LV e' lateral:   7.01 cm/s LVOT diam:     1.80 cm      LV E/e' lateral: 20.0 LV SV:         11 LV SV Index:   7 LVOT Area:     2.54 cm  LV Volumes (MOD) LV vol d, MOD A2C: 184.0 ml LV vol d, MOD A4C: 186.0 ml LV vol s, MOD A2C: 115.0 ml LV vol s, MOD A4C: 129.0 ml LV SV MOD A2C:  69.0 ml LV SV MOD A4C:     186.0 ml LV SV MOD BP:      59.1 ml RIGHT VENTRICLE         IVC TAPSE (M-mode): 1.2 cm  IVC diam: 3.30 cm AORTIC VALVE LVOT Vmax:   48.90 cm/s LVOT Vmean:  29.200 cm/s LVOT VTI:    0.043 m MITRAL VALVE                TRICUSPID VALVE MV Area (PHT): 4.31 cm     TR Peak grad:   20.4 mmHg MV Area VTI:   0.27 cm     TR Vmax:        226.00 cm/s MV Peak grad:  14.6 mmHg MV Mean grad:  7.0 mmHg     SHUNTS MV Vmax:       1.91 m/s     Systemic VTI:  0.04 m MV Vmean:      122.0 cm/s   Systemic Diam: 1.80 cm MV Decel Time: 176 msec MV E velocity: 140.00 cm/s MV A velocity: 84.40 cm/s MV E/A ratio:  1.66 Isaias Cowman MD Electronically signed by Isaias Cowman MD Signature Date/Time: 10/14/2021/10:33:44 AM    Final      Echo EF 30-35%, severe biatrial enlargement, moderate to severe mitral and tricuspid regurgitation  TELEMETRY: Sinus rhythm 74 bpm:  ASSESSMENT AND PLAN:  Principal Problem:   Acute on chronic combined systolic and diastolic congestive heart failure (HCC) Active Problems:   Acute renal failure superimposed on stage 3a chronic kidney disease (HCC)   Hypertension   Depression   Thrombocytopenia (HCC)   Elevated troponin    1. Acute on chronic systolic congestive heart failure, HFrEF EF 25-30% 08/02/2021, clinically improved after initial diuresis, with resultant worsening renal function, seen by nephrology, started on furosemide infusion 2.  AKI with underlying CKD stage III, followed by nephrology 3.  Elevated troponin (30, 32, 28), most consistent with myocardial injury due to chronic kidney disease without ischemia 4.  Chronic LBBB 5.  Moderate  to severe mitral and tricuspid regurgitation   Recommendations   1.  Agree current therapy 2.  Continue furosemide infusion 3.  Carefully monitor renal status, nephrology to follow 4.  Consider adding empagliflozin once renal status stabilizes which can be done as outpatient 5.  Outpatient evaluation for CRT-D per Dr. Mariana Single, MD, PhD, [facility] 10/15/2021 9:39 AM

## 2021-10-16 ENCOUNTER — Inpatient Hospital Stay: Payer: Medicare Other

## 2021-10-16 ENCOUNTER — Encounter: Payer: Self-pay | Admitting: Internal Medicine

## 2021-10-16 DIAGNOSIS — N179 Acute kidney failure, unspecified: Secondary | ICD-10-CM

## 2021-10-16 DIAGNOSIS — I5023 Acute on chronic systolic (congestive) heart failure: Secondary | ICD-10-CM

## 2021-10-16 DIAGNOSIS — F32A Depression, unspecified: Secondary | ICD-10-CM

## 2021-10-16 DIAGNOSIS — E43 Unspecified severe protein-calorie malnutrition: Secondary | ICD-10-CM | POA: Diagnosis not present

## 2021-10-16 DIAGNOSIS — Z515 Encounter for palliative care: Secondary | ICD-10-CM

## 2021-10-16 DIAGNOSIS — D696 Thrombocytopenia, unspecified: Secondary | ICD-10-CM

## 2021-10-16 DIAGNOSIS — I5043 Acute on chronic combined systolic (congestive) and diastolic (congestive) heart failure: Secondary | ICD-10-CM | POA: Diagnosis not present

## 2021-10-16 DIAGNOSIS — N1831 Chronic kidney disease, stage 3a: Secondary | ICD-10-CM

## 2021-10-16 LAB — CBC WITH DIFFERENTIAL/PLATELET
Abs Immature Granulocytes: 1.53 10*3/uL — ABNORMAL HIGH (ref 0.00–0.07)
Basophils Absolute: 0 10*3/uL (ref 0.0–0.1)
Basophils Relative: 0 %
Eosinophils Absolute: 0 10*3/uL (ref 0.0–0.5)
Eosinophils Relative: 0 %
HCT: 34.9 % — ABNORMAL LOW (ref 36.0–46.0)
Hemoglobin: 11.2 g/dL — ABNORMAL LOW (ref 12.0–15.0)
Immature Granulocytes: 19 %
Lymphocytes Relative: 9 %
Lymphs Abs: 0.7 10*3/uL (ref 0.7–4.0)
MCH: 26.5 pg (ref 26.0–34.0)
MCHC: 32.1 g/dL (ref 30.0–36.0)
MCV: 82.7 fL (ref 80.0–100.0)
Monocytes Absolute: 0.4 10*3/uL (ref 0.1–1.0)
Monocytes Relative: 5 %
Neutro Abs: 5.3 10*3/uL (ref 1.7–7.7)
Neutrophils Relative %: 67 %
Platelets: 128 10*3/uL — ABNORMAL LOW (ref 150–400)
RBC: 4.22 MIL/uL (ref 3.87–5.11)
RDW: 17.1 % — ABNORMAL HIGH (ref 11.5–15.5)
Smear Review: NORMAL
WBC: 8 10*3/uL (ref 4.0–10.5)
nRBC: 1.9 % — ABNORMAL HIGH (ref 0.0–0.2)

## 2021-10-16 LAB — BASIC METABOLIC PANEL
Anion gap: 22 — ABNORMAL HIGH (ref 5–15)
Anion gap: 22 — ABNORMAL HIGH (ref 5–15)
Anion gap: 14 (ref 5–15)
BUN: 74 mg/dL — ABNORMAL HIGH (ref 8–23)
BUN: 80 mg/dL — ABNORMAL HIGH (ref 8–23)
BUN: 76 mg/dL — ABNORMAL HIGH (ref 8–23)
CO2: 9 mmol/L — ABNORMAL LOW (ref 22–32)
CO2: 14 mmol/L — ABNORMAL LOW (ref 22–32)
CO2: 19 mmol/L — ABNORMAL LOW (ref 22–32)
Calcium: 9.5 mg/dL (ref 8.9–10.3)
Calcium: 9.5 mg/dL (ref 8.9–10.3)
Calcium: 8.9 mg/dL (ref 8.9–10.3)
Chloride: 106 mmol/L (ref 98–111)
Chloride: 106 mmol/L (ref 98–111)
Chloride: 106 mmol/L (ref 98–111)
Creatinine, Ser: 2.7 mg/dL — ABNORMAL HIGH (ref 0.44–1.00)
Creatinine, Ser: 2.62 mg/dL — ABNORMAL HIGH (ref 0.44–1.00)
Creatinine, Ser: 2.53 mg/dL — ABNORMAL HIGH (ref 0.44–1.00)
GFR, Estimated: 17 mL/min — ABNORMAL LOW (ref >60)
GFR, Estimated: 18 mL/min — ABNORMAL LOW (ref >60)
GFR, Estimated: 19 mL/min — ABNORMAL LOW (ref >60)
Glucose, Bld: 135 mg/dL — ABNORMAL HIGH (ref 70–99)
Glucose, Bld: 90 mg/dL (ref 70–99)
Glucose, Bld: 150 mg/dL — ABNORMAL HIGH (ref 70–99)
Potassium: 5.7 mmol/L — ABNORMAL HIGH (ref 3.5–5.1)
Potassium: 5.2 mmol/L — ABNORMAL HIGH (ref 3.5–5.1)
Potassium: 5.8 mmol/L — ABNORMAL HIGH (ref 3.5–5.1)
Sodium: 137 mmol/L (ref 135–145)
Sodium: 142 mmol/L (ref 135–145)
Sodium: 139 mmol/L (ref 135–145)

## 2021-10-16 LAB — CBC
HCT: 42.6 % (ref 36.0–46.0)
Hemoglobin: 13 g/dL (ref 12.0–15.0)
MCH: 25.9 pg — ABNORMAL LOW (ref 26.0–34.0)
MCHC: 30.5 g/dL (ref 30.0–36.0)
MCV: 85 fL (ref 80.0–100.0)
Platelets: 120 10*3/uL — ABNORMAL LOW (ref 150–400)
RBC: 5.01 MIL/uL (ref 3.87–5.11)
RDW: 17.7 % — ABNORMAL HIGH (ref 11.5–15.5)
WBC: 3.8 10*3/uL — ABNORMAL LOW (ref 4.0–10.5)
nRBC: 5 % — ABNORMAL HIGH (ref 0.0–0.2)

## 2021-10-16 LAB — MAGNESIUM: Magnesium: 2.9 mg/dL — ABNORMAL HIGH (ref 1.7–2.4)

## 2021-10-16 LAB — BLOOD GAS, VENOUS
Acid-base deficit: 5.3 mmol/L — ABNORMAL HIGH (ref 0.0–2.0)
Bicarbonate: 19.1 mmol/L — ABNORMAL LOW (ref 20.0–28.0)
O2 Saturation: 73.8 %
Patient temperature: 37
pCO2, Ven: 33 mmHg — ABNORMAL LOW (ref 44–60)
pH, Ven: 7.37 (ref 7.25–7.43)
pO2, Ven: 45 mmHg (ref 32–45)

## 2021-10-16 LAB — LACTIC ACID, PLASMA
Lactic Acid, Venous: >9 mmol/L (ref 0.5–1.9)
Lactic Acid, Venous: 2.9 mmol/L (ref 0.5–1.9)

## 2021-10-16 LAB — GLUCOSE, CAPILLARY
Glucose-Capillary: 282 mg/dL — ABNORMAL HIGH (ref 70–99)
Glucose-Capillary: 39 mg/dL — CL (ref 70–99)
Glucose-Capillary: 117 mg/dL — ABNORMAL HIGH (ref 70–99)

## 2021-10-16 LAB — TROPONIN I (HIGH SENSITIVITY)
Troponin I (High Sensitivity): 96 ng/L — ABNORMAL HIGH (ref <18)
Troponin I (High Sensitivity): 144 ng/L (ref <18)

## 2021-10-16 LAB — BRAIN NATRIURETIC PEPTIDE: B Natriuretic Peptide: >4500 pg/mL — ABNORMAL HIGH (ref 0.0–100.0)

## 2021-10-16 LAB — SARS CORONAVIRUS 2 BY RT PCR: SARS Coronavirus 2 by RT PCR: NEGATIVE

## 2021-10-16 LAB — D-DIMER, QUANTITATIVE: D-Dimer, Quant: 4.76 ug/mL-FEU — ABNORMAL HIGH (ref 0.00–0.50)

## 2021-10-16 MED ORDER — DEXTROSE 50 % IV SOLN
INTRAVENOUS | Status: AC
  Administered 2021-10-16: 25 g via INTRAVENOUS
  Filled 2021-10-16: qty 50

## 2021-10-16 MED ORDER — DEXTROSE 50 % IV SOLN: 50.0000 mL | INTRAVENOUS | Status: DC | PRN

## 2021-10-16 MED ORDER — SODIUM BICARBONATE 8.4 % IV SOLN
50.0000 meq | Freq: Once | INTRAVENOUS | Status: AC
  Administered 2021-10-16: 50 meq via INTRAVENOUS
  Filled 2021-10-16: qty 50

## 2021-10-16 MED ORDER — ALBUTEROL SULFATE (2.5 MG/3ML) 0.083% IN NEBU
10.0000 mg | INHALATION_SOLUTION | Freq: Once | RESPIRATORY_TRACT | Status: AC
  Administered 2021-10-16: 10 mg via RESPIRATORY_TRACT
  Filled 2021-10-16: qty 12

## 2021-10-16 MED ORDER — METOLAZONE 5 MG PO TABS
5.0000 mg | ORAL_TABLET | Freq: Once | ORAL | Status: AC
  Administered 2021-10-16: 5 mg via ORAL
  Filled 2021-10-16: qty 1

## 2021-10-16 MED ORDER — SODIUM ZIRCONIUM CYCLOSILICATE 5 G PO PACK: 10.0000 g | PACK | Freq: Once | ORAL | Status: DC

## 2021-10-16 MED ORDER — SODIUM ZIRCONIUM CYCLOSILICATE 5 G PO PACK
10.0000 g | PACK | ORAL | Status: AC
  Administered 2021-10-16 – 2021-10-17 (×2): 10 g via ORAL
  Filled 2021-10-16 (×2): qty 2

## 2021-10-16 MED ORDER — HEPARIN SODIUM (PORCINE) 5000 UNIT/ML IJ SOLN
5000.0000 [IU] | Freq: Three times a day (TID) | INTRAMUSCULAR | Status: DC
  Administered 2021-10-17 – 2021-10-20 (×10): 5000 [IU] via SUBCUTANEOUS
  Filled 2021-10-16 (×10): qty 1

## 2021-10-16 MED ORDER — PATIROMER SORBITEX CALCIUM 8.4 G PO PACK
16.8000 g | PACK | Freq: Every day | ORAL | Status: DC
  Filled 2021-10-16: qty 2

## 2021-10-16 MED ORDER — CHLORHEXIDINE GLUCONATE CLOTH 2 % EX PADS
6.0000 | MEDICATED_PAD | Freq: Every day | CUTANEOUS | Status: DC
  Administered 2021-10-17 – 2021-10-19 (×3): 6 via TOPICAL

## 2021-10-16 MED ORDER — SODIUM CHLORIDE 0.9 % IV SOLN: INTRAVENOUS | Status: DC | PRN

## 2021-10-16 MED ORDER — SODIUM BICARBONATE 8.4 % IV SOLN
100.0000 meq | Freq: Once | INTRAVENOUS | Status: AC
  Administered 2021-10-16: 100 meq via INTRAVENOUS
  Filled 2021-10-16: qty 50

## 2021-10-16 MED ORDER — CHLORHEXIDINE GLUCONATE CLOTH 2 % EX PADS: 6.0000 | MEDICATED_PAD | Freq: Every day | CUTANEOUS | Status: DC

## 2021-10-16 MED ORDER — DEXTROSE 50 % IV SOLN: 25.0000 g | INTRAVENOUS | Status: AC

## 2021-10-16 MED ORDER — PATIROMER SORBITEX CALCIUM 8.4 G PO PACK
16.8000 g | PACK | Freq: Once | ORAL | Status: AC
  Administered 2021-10-16: 16.8 g via ORAL
  Filled 2021-10-16: qty 2

## 2021-10-16 NOTE — Significant Event (Signed)
Pt newly feeling short of breath, RR 30s-40s, shallow and labored. Before now, pt has not been feeling short of breath even when getting up to commode. Sats 96% RA. Pt also very lethargic. Rapid response called. Blood sugar obtained = 39.  D50 given. After blood sugar improved, pt more alert, a little less lethargic but RR still 30s and continues to feels short of breath. Denies chest pain. EKG done. Labs and CXR and ABG ordered by MD.  MD notified of critical lab results of pH 7.18.  Bicarb given per order.  RR continues in 30s and shallow, and pt reports no improvement in SOB. Discussed pt and test results with MD and pt transferred to stepdown bed.

## 2021-10-16 NOTE — Progress Notes (Signed)
PT Cancellation Note  Patient Details Name: Zoe Boyle MRN: [id number] DOB: 06/22/1940   Cancelled Treatment:    Reason Eval/Treat Not Completed: Medical issues which prohibited therapy. Pt transferred to higher level of care per chart for a decline in respiratory status. PT to sign off, please re-consult when patient is medically appropriate for exertional activity.   Lieutenant Diego PT, DPT 12:03 PM,10/16/21

## 2021-10-16 NOTE — Consult Note (Addendum)
Consultation Note Date: 10/16/2021   Patient Name: Zoe Boyle  DOB: 02-09-1940  MRN: [id number]  Age / Sex: 81 y.o., female  PCP: Gladstone Lighter, MD Referring Physician: Tawni Millers  Reason for Consultation: Establishing goals of care  HPI/Patient Profile: 81 y.o. female  with past medical history of CHF, dyslipidemia, HTN, recent hospitalizations (June 2023 for group A strep bacteremia, July 2023 for heart failure) and CKD (stage IIIa) admitted on 10/11/2021 with complaints of dyspnea and lower extremity edema x3 to 4 days.  Edema did not improve despite home regimen of diuretics.  Patient is being treated for acute kidney injury with IV Lasix gtt and albumin.  Rapid response was called in the early morning of 9/18 due to tachypnea and bradycardia.  Patient was subsequently transferred to ICU.  Palliative medicine team was consulted to discuss goals of care.  Clinical Assessment and Goals of Care: I have reviewed medical records including EPIC notes, labs and imaging, assessed the patient and then met with patient at bedside.  She is sleepy and unable to stay awake to engage in discussion at this time.  She shares she did not sleep well last night and was in agreement for me to speak with her daughter Tressia Miners over the phone.  I spoke with Traci over the phone to discuss diagnosis prognosis, [place], EOL wishes, disposition and options.  I introduced Palliative Medicine as specialized medical care for people living with serious illness. It focuses on providing relief from the symptoms and stress of a serious illness. The goal is to improve quality of life for both the patient and the family.  We discussed patient's current illness and what it means in the larger context of patient's on-going co-morbidities.  Brief medical update given.  Education provided on worsening heart failure superimposed on  AKI.   Reviewed that cardiology and nephrology are following patient closely. We discussed use of medications as conservative measures in order to address volume overload. Briefly discussed HD in context of patient's existing heart condition and advanced age. Reviewed functional, nutritional, and cognitive status as contributing factor's to patient's overall prognosis.   Therapeutic silence and active listening provided for Traci to share her thoughts and emotions regarding her mother's current medical situation.  Tressia Miners is concerned that the discussions she has had with the medical team means that her mother is at end-of-life.  I clarified that patient is not at end-of-life but has chronic illnesses (kidney and heart) that we know cannot be reversed. I reviewed her long term prognosis   Discussions of boundaries/limitations and goals of care are important to help medical team know how aggressive patient wants to be in her care.  Discussed with Traci the importance of continued conversation with family and the medical providers regarding overall plan of care and treatment options, ensuring decisions are within the context of the patient's values and GOCs.    Traci shares she plans to be at the hospital tomorrow morning. I plan on meeting with Traci at patient's bedside tomorrow (  9/19) sometime in AM (when [place] arrives) to further discuss [place].   Primary Decision Maker NEXT OF KIN  Code Status/Advance Care Planning: Full code  Prognosis:   Unable to determine  Discharge Planning: To Be Determined  Primary Diagnoses: Present on Admission:  Acute on chronic systolic CHF (congestive heart failure) (HCC)  Acute renal failure superimposed on stage 3a chronic kidney disease (HCC)  Thrombocytopenia (HCC)  Hypertension  Depression  Protein-calorie malnutrition, severe   Physical Exam Vitals reviewed.  Constitutional:      General: She is not in acute distress.    Appearance: She is not  toxic-appearing.  HENT:     Head: Normocephalic.  Cardiovascular:     Rate and Rhythm: Normal rate. Rhythm irregular.  Pulmonary:     Effort: Pulmonary effort is normal.     Comments: Nasal canula in place Musculoskeletal:     Comments: Generalized weakness  Skin:    General: Skin is warm and dry.  Neurological:     Mental Status: She is alert and oriented to person, place, and time.  Psychiatric:        Mood and Affect: Mood normal. Mood is not anxious.        Behavior: Behavior normal. Behavior is not agitated.     Palliative Assessment/Data: 50%     Thank you for this consult. Palliative medicine will continue to follow and assist holistically.   Time Total: 75 minutes Greater than 50%  of this time was spent counseling and coordinating care related to the above assessment and plan.  Signed by: Jordan Hawks, DNP, FNP-BC Palliative Medicine    Please contact Palliative Medicine Team phone at [phone] for questions and concerns.  For individual provider: See Shea Evans

## 2021-10-16 NOTE — Hospital Course (Signed)
Zoe Boyle was admitted to the hospital with the working diagnosis of decompensated heart failure.   81 yo female with the past medical history of congested heart failure, hypertension, dyslipidemia and CKD stage 3a who presented with dyspnea and lower extremity edema. Reported 3 to 4 days of dyspnea, lower extremity edema despite taking diuretic therapy at home. Recent hospitalization 06/11 to 07/13/21 for group A strep bacteremia and 07/23 for heart failure. On her initial physical examination her blood pressure was 99/77, rr 24 to 32, HR 72 and 02 saturation 96% on room air, she had increased work of breathing, heart with S1 and S2 present and rhythmic, abdomen not distended and positive lower extremity edema.   NA 137, K 3,4 CL 106, bicarbonate 18, glucose 160 un 53 cr 1,72  High sensitive troponin 46 and 40  Wbc 4,7 hgb 121 plt 86   Chest radiograph with significant cardiomegaly, bilateral hilar vascular congestion and bilateral small pleural effusions.   EKG 79 bpm, right axis, qtc 525, left bundle branch block, sinus rhythm with first degree AV block, J point elevation V1 to V3, no significant T wave changes.  Patient was placed on furosemide for diuresis. Echocardiogram with reduced LV systolic function.

## 2021-10-16 NOTE — Progress Notes (Signed)
Received patient from PCU, report from Judson Roch

## 2021-10-16 NOTE — Progress Notes (Signed)
[facility]  ROUNDING NOTE   Subjective:   Patient seen and evaluated at bedside in ICU Alert and oriented  Family friend at bedside  Complains of weakness  Creatinine 2.62 Urine output 300 mL in preceding 24 hours. Furosemide drip at 10 mg/h  Objective:  Vital signs in last 24 hours:  Temp:  [96.1 F (35.6 C)-98.8 F (37.1 C)] 98.8 F (37.1 C) (09/18 0433) Pulse Rate:  [43-100] 74 (09/18 1100) Resp:  [24-49] 25 (09/18 1100) BP: (81-128)/(71-100) 112/84 (09/18 1100) SpO2:  [95 %-100 %] 99 % (09/18 1100)  Weight change:  Filed Weights   10/13/21 0634 10/14/21 0515 10/15/21 0432  Weight: 54.1 kg 53.7 kg 54.4 kg    Intake/Output: I/O last 3 completed shifts: In: 783.2 [P.O.:600; I.V.:83.2; IV Piggyback:100] Out: 600 [Urine:600]   Intake/Output this shift:  Total I/O In: 310.8 [P.O.:300; I.V.:10.8] Out: 50 [Urine:50]  Physical Exam: General: NAD  Head: Normocephalic, atraumatic.  Dry oral mucosal membranes  Eyes: Anicteric  Lungs:  Diminished in bases, normal effort  Heart: Regular rate and rhythm  Abdomen:  Soft, nontender  Extremities:  trace peripheral edema.  Neurologic: Nonfocal, moving all four extremities  Skin: No lesions  Access: None    Basic Metabolic Panel: Recent Labs  Lab 10/13/21 0604 10/14/21 0749 10/15/21 0517 10/16/21 0352 10/16/21 0717  NA 138 139 138 137 142  K 4.9 4.8 4.9 5.7* 5.2*  CL 109 111 110 106 106  CO2 18* 18* 16* 9* 14*  GLUCOSE 118* 144* 102* 135* 90  BUN 57* 63* 65* 74* 80*  CREATININE 1.84* 1.93* 1.87* 2.70* 2.62*  CALCIUM 9.1 9.4 9.4 9.5 9.5  MG 2.2  --   --  2.9*  --      Liver Function Tests: No results for input(s): "AST", "ALT", "ALKPHOS", "BILITOT", "PROT", "ALBUMIN" in the last 168 hours. No results for input(s): "LIPASE", "AMYLASE" in the last 168 hours. No results for input(s): "AMMONIA" in the last 168 hours.  CBC: Recent Labs  Lab 10/11/21 1712 10/12/21 0411 10/14/21 0752  10/15/21 0517 10/16/21 0352  WBC 4.7 4.7 4.7 4.3 3.8*  HGB 12.0 12.2 12.2 12.2 13.0  HCT 36.4 35.9* 38.5 36.6 42.6  MCV 80.5 79.2* 81.9 81.5 85.0  PLT 86* 80* 119* 140* 120*     Cardiac Enzymes: No results for input(s): "CKTOTAL", "CKMB", "CKMBINDEX", "TROPONINI" in the last 168 hours.  BNP: Invalid input(s): "POCBNP"  CBG: Recent Labs  Lab 10/16/21 0249 10/16/21 0301 10/16/21 0359  GLUCAP 39* 282* 117*    Microbiology: Results for orders placed or performed during the hospital encounter of 10/11/21  SARS Coronavirus 2 by RT PCR (hospital order, performed in [facility] hospital lab) *cepheid single result test* Anterior Nasal Swab     Status: None   Collection Time: 10/16/21  4:18 AM   Specimen: Anterior Nasal Swab  Result Value Ref Range Status   SARS Coronavirus 2 by RT PCR NEGATIVE NEGATIVE Final    Comment: (NOTE) SARS-CoV-2 target nucleic acids are NOT DETECTED.  The SARS-CoV-2 RNA is generally detectable in upper and lower respiratory specimens during the acute phase of infection. The lowest concentration of SARS-CoV-2 viral copies this assay can detect is 250 copies / mL. A negative result does not preclude SARS-CoV-2 infection and should not be used as the sole basis for treatment or other patient management decisions.  A negative result may occur with improper specimen collection / handling, submission of specimen other than nasopharyngeal swab,  presence of viral mutation(s) within the areas targeted by this assay, and inadequate number of viral copies (<250 copies / mL). A negative result must be combined with clinical observations, patient history, and epidemiological information.  Fact Sheet for Patients:   https://www.patel.info/  Fact Sheet for Healthcare Providers: https://hall.com/  This test is not yet approved or  cleared by the [place] FDA and has been authorized for detection and/or diagnosis  of SARS-CoV-2 by FDA under an Emergency Use Authorization (EUA).  This EUA will remain in effect (meaning this test can be used) for the duration of the COVID-19 declaration under Section 564(b)(1) of the Act, 21 U.S.C. section 360bbb-3(b)(1), unless the authorization is terminated or revoked sooner.  Performed at [facility], [address] [place], [place] [address]     Coagulation Studies: No results for input(s): "LABPROT", "INR" in the last 72 hours.  Urinalysis: No results for input(s): "COLORURINE", "LABSPEC", "PHURINE", "GLUCOSEU", "HGBUR", "BILIRUBINUR", "KETONESUR", "PROTEINUR", "UROBILINOGEN", "NITRITE", "LEUKOCYTESUR" in the last 72 hours.  Invalid input(s): "APPERANCEUR"    Imaging: DG Chest Port 1 View  Result Date: 10/16/2021 CLINICAL DATA:  Acute respiratory failure EXAM: PORTABLE CHEST 1 VIEW COMPARISON:  Chest x-ray 10/11/2020 FINDINGS: There is marked enlargement of the cardiac silhouette, unchanged. There is central pulmonary vascular congestion. Small pleural effusions are present, increasing on the right. Bibasilar opacities persist. There is no pneumothorax or acute fracture. IMPRESSION: 1. Bilateral pleural effusions, increasing on the right. 2. Cardiomegaly with central pulmonary vascular congestion. 3. Bilateral airspace disease appears stable. Electronically Signed   By: Ronney Asters M.D.   On: 10/16/2021 03:24     Medications:    albumin human 12.5 g (10/15/21 2344)   furosemide (LASIX) 200 mg in dextrose 5 % 100 mL (2 mg/mL) infusion 10 mg/hr (10/16/21 1000)   promethazine (PHENERGAN) injection (IM or IVPB)      acidophilus  1 capsule Oral Daily   Chlorhexidine Gluconate Cloth  6 each Topical Daily   enoxaparin (LOVENOX) injection  30 mg Subcutaneous Q24H   feeding supplement  237 mL Oral TID BM   megestrol  320 mg Oral Daily   metolazone  5 mg Oral Once   mirtazapine  15 mg Oral QHS   multivitamin with minerals  1 tablet Oral Daily    patiromer  16.8 g Oral Once   pravastatin  20 mg Oral Daily   acetaminophen **OR** acetaminophen, dextrose, ondansetron **OR** ondansetron (ZOFRAN) IV, mouth rinse, promethazine (PHENERGAN) injection (IM or IVPB)  Assessment/ Plan:  Ms. Teran Daughenbaugh is a 81 y.o.  female with medical problems of chronic systolic CHF with EF of 25 to 30% from June 2023, hypertension, hyperlipidemia, chronic kidney disease, anemia, depression, hospitalization from strep bacteremia in July 2023,   was admitted on 10/11/2021 for CHF (congestive heart failure) ([place]) [I50.9] Acute on chronic congestive heart failure, unspecified heart failure type ([place]) [I50.9]   Acute kidney injury in the setting of multiple valvular abnormalities, severe systolic CHF and chronic kidney disease stage IIIb with baseline creatinine 1.45 from August 04, 2021/GFR 37, lower extremity edema with volume overload.   Creatinine increased overnight, not unexpected with increased Furosemide drip. UOP has decreased. Lower extremity edema improved but respiratory status declined overnight resulting in ICU placement. Primary team has increased Furosemide drip to 10mg /hr with one dose of Metolazone 5mg . Discussed with daughter that patient may require dialysis if currently measures unsuccessful. Dialysis will prove difficult with patient cardiac valvular abnormalities. Will re-assess tomorrow.  Lab Results  Component Value Date   CREATININE 2.62 (H) 10/16/2021   CREATININE 2.70 (H) 10/16/2021   CREATININE 1.87 (H) 10/15/2021    Intake/Output Summary (Last 24 hours) at 10/16/2021 1121 Last data filed at 10/16/2021 1000 Gross per 24 hour  Intake 824.25 ml  Output 250 ml  Net 574.25 ml    2.  Acute on chronic congestive heart failure, unspecified heart failure type ([place]) [I50.9] 2D echo July 2023: LVEF 25 to 30%, global hypokinesis, moderately dilated left ventricular internal cavity, severely dilated left atrium, small pericardial effusion, severe  mitral regurgitation, severe tricuspid regurgitation, moderately elevated pulmonary artery systolic pressure.  Cardiology is following and recommend CRT-D outpatient with Dr. Clayborn Bigness.  Furosemide drip increased yesterday with the addition of Albumin to optimize fluid removal. Chart review states patient became short of breath overnight.   3.  Hypertension with chronic kidney disease stage IIIb.  Home regimen includes carvedilol, spironolactone, and torsemide.  All currently held.    Blood pressure stable, 112/84  4. Anemia of chronic kidney disease Normocytic Lab Results  Component Value Date   HGB 13.0 10/16/2021    Hemoglobin within desired target.    LOS: 5 Visente Kirker 9/18/202311:21 AM

## 2021-10-16 NOTE — Progress Notes (Signed)
ABG obtained and resulted. See Results.

## 2021-10-16 NOTE — Progress Notes (Signed)
   10/16/21 0243  Assess: MEWS Score  BP (!) 120/92  MAP (mmHg) 102  Pulse Rate (!) 43  ECG Heart Rate 92  Resp (!) 40  SpO2 96 %  O2 Device Room Air  Patient Activity (if Appropriate) In bed  Assess: MEWS Score  MEWS Temp 0  MEWS Systolic 0  MEWS Pulse 0  MEWS RR 3  MEWS LOC 0  MEWS Score 3  MEWS Score Color Yellow  Assess: if the MEWS score is Yellow or Red  Were vital signs taken at a resting state? Yes  Focused Assessment Change from prior assessment (see assessment flowsheet)  Does the patient meet 2 or more of the SIRS criteria? Yes  Does the patient have a confirmed or suspected source of infection? No  MEWS guidelines implemented *See Row Information* Yes  Treat  MEWS Interventions Escalated (See documentation below);Consulted Respiratory Therapy (rapid response team called)  Pain Scale 0-10  Pain Score 0  Take Vital Signs  Increase Vital Sign Frequency  Yellow: Q 2hr X 2 then Q 4hr X 2, if remains yellow, continue Q 4hrs  Escalate  MEWS: Escalate Yellow: discuss with charge nurse/RN and consider discussing with provider and RRT  Notify: Charge Nurse/RN  Name of Charge Nurse/RN Notified Chief Operating Officer  Date Charge Nurse/RN Notified 10/16/21  Time Charge Nurse/RN Notified 0245  Notify: Provider  Provider Name/Title Dr. Judd Gaudier  Date Provider Notified 10/16/21  Notify: Rapid Response  Name of Rapid Response RN Notified rapid response alert called  Date Rapid Response Notified 10/16/21  Document  Patient Outcome Transferred/level of care increased  Progress note created (see row info) Yes  Assess: SIRS CRITERIA  SIRS Temperature  0  SIRS Pulse 1  SIRS Respirations  1  SIRS WBC 1  SIRS Score Sum  3

## 2021-10-16 NOTE — Progress Notes (Signed)
OT Cancellation Note  Patient Details Name: Zoe Boyle MRN: [id number] DOB: 01-Feb-1940   Cancelled Treatment:    Reason Eval/Treat Not Completed: Medical issues which prohibited therapy Patient was transferred to higher level of care per chart for a decline in respiratory status.  Patient also currently with potassium level of 5.2, which is a contraindication to therapeutic activity.  OT to sign off, please re-consult when patient is medically appropriate for therapeutic activity to reassess patient's level of functioning.  Jeneen Montgomery, OTR/L 10/16/21, 12:54 PM

## 2021-10-16 NOTE — Assessment & Plan Note (Addendum)
Echocardiogram with LV systolic function 30 to 22%, mild dilatated LV cavity, RV systolic function preserved, severe dilatation LA and RA, severe TR, severe MR.  --Volume status and renal function improved with Lasix drip --Further diuresis and GDMT limited by hypotension --Nephrology and cardiology are following.  -- Hold RAS inhibition with AKI -- Hold on B blockade with  decompensated heart failure and now soft BP's.   Acute hypoxemic respiratory failure due to cardiogenic pulmonary edema.  --supplement O2 PRN to keep sats > 90%, wean as tolerated

## 2021-10-16 NOTE — Assessment & Plan Note (Signed)
Continue with nutritional supplements.  

## 2021-10-16 NOTE — Progress Notes (Signed)
[facility]  Patient ID: Zoe Boyle MRN: [id number] DOB/AGE: Jun 10, 1940 81 y.o.   Admit date: 10/11/2021 Referring Physician Dr. Shelly Coss Primary Physician Dr. Tressia Miners  Primary Cardiologist Dr. Clayborn Bigness Reason for Consultation acute on chronic HFrEF   HPI: Zoe Boyle is a 81yoF with a PMH of dilated cardiomyopathy, HFrEF (LVEF 25-30% with global hypo, severe MR 08/02/2021), small pericardial effusion, hypertension, hyperlipidemia, CKD 3 who presented to [facility] ED 10/11/2021 with shortness of breath and associated leg swelling.  Cardiology is consulted on hospital day 2 for assistance with her heart failure.  Interval History: -rapid response called overnight as tachypnea worsened (RR 30s), stat cxr showed worsening of pleural effusions R>L and bmp with worsening renal function (Cr 2.7, GFR 17). Transferred to stepdown  -she tells me she feels "alright" and that she can breathe better (wearing 2L [place]) but still appears short of breath  -no chest pain -Minimal urine output the past 24 hours (300 cc recorded)   Vitals:   10/16/21 0315 10/16/21 0433 10/16/21 0700 10/16/21 0730  BP:  (!) 114/94 (!) 107/90   Pulse:  82 80 86  Resp:  (!) 28 (!) 30 (!) 49  Temp: (!) 96.1 F (35.6 C) 98.8 F (37.1 C)    TempSrc: Axillary Rectal    SpO2:  97% 97% 98%  Weight:      Height:         Intake/Output Summary (Last 24 hours) at 10/16/2021 0824 Last data filed at 10/16/2021 0810 Gross per 24 hour  Intake 639.48 ml  Output 350 ml  Net 289.48 ml     PHYSICAL EXAM General: Elderly and frail-appearing black female, in no acute distress. Sitting upright in ICU bed, no family at bedside HEENT:  Normocephalic and atraumatic. Neck:   No JVD.  Lungs: Short of breath appearing on [place] oxygen.  decreased breath sounds R base, crackles on L without wheezes Heart: regular rate with frequent premature beats . Normal S1 and S2.  2/6 systolic murmur best heard at the apex.  Abdomen: Non-distended  appearing.  Msk: generalized weakness  Extremities: Warm to touch. No clubbing, cyanosis.  2+ pitting edema bilateral lower extremities.  Neuro: Alert and oriented X 3. Psych:  Answers questions appropriately.    LABS: Basic Metabolic Panel: Recent Labs    10/15/21 0517 10/16/21 0352  NA 138 137  K 4.9 5.7*  CL 110 106  CO2 16* 9*  GLUCOSE 102* 135*  BUN 65* 74*  CREATININE 1.87* 2.70*  CALCIUM 9.4 9.5  MG  --  2.9*    Liver Function Tests: No results for input(s): "AST", "ALT", "ALKPHOS", "BILITOT", "PROT", "ALBUMIN" in the last 72 hours. No results for input(s): "LIPASE", "AMYLASE" in the last 72 hours. CBC: Recent Labs    10/15/21 0517 10/16/21 0352  WBC 4.3 3.8*  HGB 12.2 13.0  HCT 36.6 42.6  MCV 81.5 85.0  PLT 140* 120*    Cardiac Enzymes: No results for input(s): "CKTOTAL", "CKMB", "CKMBINDEX", "TROPONINI" in the last 72 hours. BNP: Invalid input(s): "POCBNP" D-Dimer: Recent Labs    10/16/21 0352  DDIMER 4.76*   Hemoglobin A1C: No results for input(s): "HGBA1C" in the last 72 hours. Fasting Lipid Panel: No results for input(s): "CHOL", "HDL", "LDLCALC", "TRIG", "CHOLHDL", "LDLDIRECT" in the last 72 hours. Thyroid Function Tests: No results for input(s): "TSH", "T4TOTAL", "T3FREE", "THYROIDAB" in the last 72 hours.  Invalid input(s): "FREET3" Anemia Panel: No results for input(s): "VITAMINB12", "FOLATE", "FERRITIN", "TIBC", "IRON", "RETICCTPCT" in the  last 72 hours.  DG Chest Port 1 View  Result Date: 10/16/2021 CLINICAL DATA:  Acute respiratory failure EXAM: PORTABLE CHEST 1 VIEW COMPARISON:  Chest x-ray 10/11/2020 FINDINGS: There is marked enlargement of the cardiac silhouette, unchanged. There is central pulmonary vascular congestion. Small pleural effusions are present, increasing on the right. Bibasilar opacities persist. There is no pneumothorax or acute fracture. IMPRESSION: 1. Bilateral pleural effusions, increasing on the right. 2.  Cardiomegaly with central pulmonary vascular congestion. 3. Bilateral airspace disease appears stable. Electronically Signed   By: Ronney Asters M.D.   On: 10/16/2021 03:24     Echo EF 30-35%, severe biatrial enlargement, moderate to severe mitral and tricuspid regurgitation  TELEMETRY: SR frequent PACs rate mostly 90s  EKG: 9/18, SR LBBB frequent PACs   Data reviewed by me (LT): Hospitalist progress note, nephrology note, nursing notes, cross cover note, CBC, BMP, chest x-ray, I's and O's  ASSESSMENT AND PLAN:  Principal Problem:   Acute on chronic combined systolic and diastolic congestive heart failure ([place]) Active Problems:   Acute renal failure superimposed on stage 3a chronic kidney disease ([place])   Hypertension   Depression   Thrombocytopenia (HCC)   Elevated troponin   Protein-calorie malnutrition, severe    1. Acute on chronic systolic congestive heart failure, HFrEF EF 25-30% 08/02/2021, slight clinical improvement after initial diuresis, with resultant worsening renal function, seen by nephrology, started on furosemide infusion, worsening respiratory status overnight on 9/17 resulting in transfer to stepdown 2.  Acute renal failure with underlying CKD stage III, followed by nephrology 3.  Elevated troponin (30, 32, 28), most consistent with myocardial injury due to chronic kidney disease without ischemia 4.  Chronic LBBB 5.  Moderate to severe mitral and tricuspid regurgitation 6.  Moderate right pleural effusion   Recommendations   1.  Agree current therapy 2.  Agree with increase in furosemide infusion 10 mg/h, in addition of metolazone 5 mg x 1 3.  Hold beta-blocker with decompensated HFrEF 4.  Carefully monitor renal status, nephrology to follow 5.  Consider adding empagliflozin if renal status stabilizes which can be done as outpatient 6.  Outpatient evaluation for CRT-D per Dr. Clayborn Bigness 7.   Recommend palliative involvement to multiple comorbidities and worsening  heart failure and renal failure.   This patient's plan of care was discussed and created with Dr. Nehemiah Massed and he is in agreement.    Tristan Schroeder, PA-C 10/16/2021 8:24 AM  The patient significant somnolence today and is short of breath with significant concerns of congestive heart failure and pulmonary edema.  The patient will receive continued guideline medical therapy and Lasix as well as metolazone for better urine output.  The patient has had no evidence of chest discomfort at this time.  She does have some tachycardia today with some preventricular contractions which appear to be stable at this time.  I have personally reviewed chest x-ray, telemetry, EKG, laboratory work for further treatment options as per listed above.  The patient has been interviewed and examined. I agree with assessment and plan above. Serafina Royals MD [facility]

## 2021-10-16 NOTE — Progress Notes (Addendum)
  Rapid response/event progress Note   Patient: Zoe Boyle [id number] DOB: [birth year] DOA: 10/11/2021     5 DOS: the patient was seen and examined on 10/16/2021     HPI/Events of Note   Rapid response call for patient tachypneic to the 30s  Chart review History and physical, last progress note, cardiologist and nephrology notes reviewed as well as current treatment  Assessment and  Interventions   Assessment: Acute respiratory failure with hypoxia Worsening AKI with metabolic acidosis and hyperkalemia Worsening pleural effusion secondary to systolic CHF  Plan: ABG, Labs, EKG, x-ray ordered and results reviewed IV bicarb, [place], DuoNebs, oxygen IR consult for thoracentesis Supplemental oxygen  Transferred to stepdown BMP every 4 Urgent consult to nephrologist, Dr. Zollie Scale for consideration of urgent dialysis.  He will see patient and decide whether dialysis is needed.  Recommends treating pleural effusion first      Physical Exam Vitals and nursing note reviewed.  Constitutional:      General: She is in acute distress.  HENT:     Head: Normocephalic and atraumatic.  Cardiovascular:     Rate and Rhythm: Regular rhythm. Tachycardia present.     Heart sounds: Normal heart sounds.  Pulmonary:     Effort: Tachypnea, accessory muscle usage and respiratory distress present.     Breath sounds: Examination of the right-middle field reveals decreased breath sounds. Examination of the right-lower field reveals decreased breath sounds. Examination of the left-lower field reveals decreased breath sounds. Decreased breath sounds and rales present.  Abdominal:     Palpations: Abdomen is soft.     Tenderness: There is no abdominal tenderness.  Neurological:     General: No focal deficit present.     Physical Exam: Vitals:   10/16/21 0243 10/16/21 0304 10/16/21 0315 10/16/21 0433  BP: (!) 120/92 110/88  (!) 114/94  Pulse: (!) 43 100  82  Resp: (!) 40   (!) 28  Temp:   (!) 96.1 F  (35.6 C) 98.8 F (37.1 C)  TempSrc:   Axillary Rectal  SpO2: 96% 98%  97%  Weight:      Height:       CRITICAL CARE Performed by: Athena Masse   Total critical care time: 120 minutes  Critical care time was exclusive of separately billable procedures and treating other patients.  Critical care was necessary to treat or prevent imminent or life-threatening deterioration.  Critical care was time spent personally by me on the following activities: development of treatment plan with patient and/or surrogate as well as nursing, discussions with consultants, evaluation of patient's response to treatment, examination of patient, obtaining history from patient or surrogate, ordering and performing treatments and interventions, ordering and review of laboratory studies, ordering and review of radiographic studies, pulse oximetry and re-evaluation of patient's condition.   Author: Athena Masse, MD 10/16/2021 5:56 AM  For on call review [email or web address].

## 2021-10-16 NOTE — Progress Notes (Signed)
Chaplain responded to RRT. [place] offered compassionate presence. Please contact [place] if requested.

## 2021-10-16 NOTE — Progress Notes (Signed)
Progress Note   Patient: Zoe Boyle [id number] DOB: Jul 03, 1940 DOA: 10/11/2021     5 DOS: the patient was seen and examined on 10/16/2021   Brief hospital course: Zoe Boyle was admitted to the hospital with the working diagnosis of decompensated heart failure.   81 yo female with the past medical history of congested heart failure, hypertension, dyslipidemia and CKD stage 3a who presented with dyspnea and lower extremity edema. Reported 3 to 4 days of dyspnea, lower extremity edema despite taking diuretic therapy at home. Recent hospitalization 06/11 to 07/13/21 for group A strep bacteremia and 07/23 for heart failure. On her initial physical examination her blood pressure was 99/77, rr 24 to 32, HR 72 and 02 saturation 96% on room air, she had increased work of breathing, heart with S1 and S2 present and rhythmic, abdomen not distended and positive lower extremity edema.   NA 137, K 3,4 CL 106, bicarbonate 18, glucose 160 un 53 cr 1,72  High sensitive troponin 46 and 40  Wbc 4,7 hgb 121 plt 86   Chest radiograph with significant cardiomegaly, bilateral hilar vascular congestion and bilateral small pleural effusions.   EKG 79 bpm, right axis, qtc 525, left bundle branch block, sinus rhythm with first degree AV block, J point elevation V1 to V3, no significant T wave changes.  Patient was placed on furosemide for diuresis. Echocardiogram with reduced LV systolic function.      Assessment and Plan: * Acute on chronic systolic CHF (congestive heart failure) (HCC) Echocardiogram with LV systolic function 30 to [id number], mild dilatated LV cavity, RV systolic function preserved, severe dilatation LA and RA, severe TR.   Documented urine output is 300 cc Patient continue volume overloaded.  Systolic blood pressure [id number] to 118 mmHg.  Follow up chest film personally reviewed with worsening acute pulmonary edema.  Plan to increase furosemide to 10 mg per hr infusion Add one dose of  metolazone Hold on RAS inhibition due to worsening renal function Hold on B blockade due to decompensated heart failure.   Acute hypoxemic respiratory failure due to cardiogenic pulmonary edema.  Her 02 saturation today is 100% on 2 L/min per [place]   Acute renal failure superimposed on stage 3a chronic kidney disease (HCC) Metabolic Acidosis/ hyperkalemia.   This am pH 7,18/ 20/76/7.5/94% Serum cr is 2,6, with K at 5,2 and serum bicarbonate at 14. BUN 80.  Lactic acid >9.0   One dose of patiromer and 1 amps of bicarbonate Plan to continue diuresis with furosemide and add one dose of metolazone.  Follow up VBG this pm Doubt patient is candidate for HD considering her heart failure.  Place foley cathter to close follow up urine output.   Hypertension \\Hold  antihypertensive medications to prevent hypotension.   Protein-calorie malnutrition, severe Continue with nutritional supplements.   Thrombocytopenia (HCC) Leukopenia.  Follow up hgb is 13, with plt at 120. Wbc is 3,8 Hold on aspirin.   Patient had a bloody bowel movement this am, she has history of hemorrhoids Hold aspirin for now and follow up on cell count this pm.    Depression Continue mirtazapine  Elevated troponin Troponin of 40 likely secondary to demand ischemia Continue to trend Continue and Pravastatin          Subjective: Patient with persistent dyspnea, no chest pain, this am had a bloody bowel movement,.   Physical Exam: Vitals:   10/16/21 0700 10/16/21 0730 10/16/21 0805 10/16/21 0900  BP: (!) 107/90  (!) 128/100 118/80  Pulse: 80 86 (!) 58 80  Resp: (!) 30 (!) 49 (!) 34 (!) 26  Temp:      TempSrc:      SpO2: 97% 98% 100% 99%  Weight:      Height:       Neurology awake and alert, deconditioned and ill looking appearing ENT with mild pallor Cardiovascular with S1 and S2 present and regular with systolic murmur at the left lower sternal border Moderate JVD Lower extremity edema ++ pitting,  extremities not cold Respiratory with rales bilaterally  Abdomen not distended  Data Reviewed:   Family Communication: no family at the bedside   Disposition: Status is: Inpatient Remains inpatient appropriate because: heart failure on IV diuretic therapy   Planned Discharge Destination: Home    Critical care Time spent: 45 minutes. Patient critically ill with decompensated heart failure, high risk for worsening    Author: Tawni Millers, MD 10/16/2021 9:14 AM  For on call review [email or web address].

## 2021-10-17 DIAGNOSIS — N179 Acute kidney failure, unspecified: Secondary | ICD-10-CM | POA: Diagnosis not present

## 2021-10-17 DIAGNOSIS — Z789 Other specified health status: Secondary | ICD-10-CM

## 2021-10-17 DIAGNOSIS — I5023 Acute on chronic systolic (congestive) heart failure: Secondary | ICD-10-CM | POA: Diagnosis not present

## 2021-10-17 DIAGNOSIS — Z515 Encounter for palliative care: Secondary | ICD-10-CM | POA: Diagnosis not present

## 2021-10-17 DIAGNOSIS — E43 Unspecified severe protein-calorie malnutrition: Secondary | ICD-10-CM | POA: Diagnosis not present

## 2021-10-17 LAB — BASIC METABOLIC PANEL
Anion gap: 13 (ref 5–15)
BUN: 79 mg/dL — ABNORMAL HIGH (ref 8–23)
CO2: 24 mmol/L (ref 22–32)
Calcium: 9.3 mg/dL (ref 8.9–10.3)
Chloride: 103 mmol/L (ref 98–111)
Creatinine, Ser: 2.43 mg/dL — ABNORMAL HIGH (ref 0.44–1.00)
GFR, Estimated: 20 mL/min — ABNORMAL LOW (ref >60)
Glucose, Bld: 110 mg/dL — ABNORMAL HIGH (ref 70–99)
Potassium: 4 mmol/L (ref 3.5–5.1)
Sodium: 140 mmol/L (ref 135–145)

## 2021-10-17 LAB — CBC WITH DIFFERENTIAL/PLATELET
Abs Immature Granulocytes: 1.37 10*3/uL — ABNORMAL HIGH (ref 0.00–0.07)
Basophils Absolute: 0 10*3/uL (ref 0.0–0.1)
Basophils Relative: 0 %
Eosinophils Absolute: 0.1 10*3/uL (ref 0.0–0.5)
Eosinophils Relative: 1 %
HCT: 34.1 % — ABNORMAL LOW (ref 36.0–46.0)
Hemoglobin: 11.3 g/dL — ABNORMAL LOW (ref 12.0–15.0)
Immature Granulocytes: 23 %
Lymphocytes Relative: 16 %
Lymphs Abs: 1 10*3/uL (ref 0.7–4.0)
MCH: 26.3 pg (ref 26.0–34.0)
MCHC: 33.1 g/dL (ref 30.0–36.0)
MCV: 79.3 fL — ABNORMAL LOW (ref 80.0–100.0)
Monocytes Absolute: 0.3 10*3/uL (ref 0.1–1.0)
Monocytes Relative: 5 %
Neutro Abs: 3.3 10*3/uL (ref 1.7–7.7)
Neutrophils Relative %: 55 %
Platelets: 118 10*3/uL — ABNORMAL LOW (ref 150–400)
RBC: 4.3 MIL/uL (ref 3.87–5.11)
RDW: 16.3 % — ABNORMAL HIGH (ref 11.5–15.5)
Smear Review: NORMAL
WBC: 6 10*3/uL (ref 4.0–10.5)
nRBC: 2.5 % — ABNORMAL HIGH (ref 0.0–0.2)

## 2021-10-17 MED ORDER — METOLAZONE 5 MG PO TABS
5.0000 mg | ORAL_TABLET | Freq: Once | ORAL | Status: AC
  Administered 2021-10-17: 5 mg via ORAL
  Filled 2021-10-17: qty 1

## 2021-10-17 NOTE — Progress Notes (Signed)
Palliative Care Progress Note, Assessment & Plan   Patient Name: Zoe Boyle       Date: 10/17/2021 DOB: 04/28/1940  Age: 81 y.o. MRN#: [id number] Attending Physician: Ezekiel Slocumb, DO Primary Care Physician: Gladstone Lighter, MD Admit Date: 10/11/2021  Reason for Consultation/Follow-up: Establishing goals of care  Subjective: Patient is lying in bed in no apparent distress.  Nasal cannula in place.  Patient able to acknowledge my presence and make her wishes known.  She is taking small bites of a chocolate chip cookie.  Daughter is at bedside.  HPI:  81 y.o. female  with past medical history of CHF, dyslipidemia, HTN, recent hospitalizations (June 2023 for group A strep bacteremia, July 2023 for heart failure) and CKD (stage IIIa) admitted on 10/11/2021 with complaints of dyspnea and lower extremity edema x3 to 4 days.  Edema did not improve despite home regimen of diuretics.   Patient is being treated for acute kidney injury with IV Lasix gtt and albumin.  Rapid response was called in the early morning of 9/18 due to tachypnea and bradycardia.  Patient was subsequently transferred to ICU.   Palliative medicine team was consulted to discuss goals of care.  Summary of counseling/coordination of care: After reviewing the patient's chart and assessing the patient at bedside, I spoke with patient and her daughter Olivia Mackie in regards to goals of care. Dr. Arbutus Ped and I met with patient and her daughter at bedside. Tressia Miners shares she has received updates from nephrology and cardiology this morning.  Attempted to elicit goals important to the patient.  Patient shares she wants to prolong her mother's life and wants her to "live forever".  When asked patient's thoughts on her current medical situation and wishes  moving forward, patient shares she is willing to try it all.  When asked to clarify, patient shares she is in agreement with full and code and resuscitative measures to sustain her life. She and daughter are in agreement with full code and use of mechanical ventilation if needed.  Discussed severity of patient's heart failure and kidney disease.  Education provided on hemodialysis and cardiovascular risks associated with treatments.  Reviewed that patient does not have an immediate need for hemodialysis but that conversations with the ongoing to ensure medical decisions are line with patient's goals and wishes.  Disposition discussed.  Traci inquired about being able to take her mother home with additional home health support.  Traci plans to have her mother return home with her and continue outpatient f/u with cardiology. Quality versus quantity of life discussed in detail.   Traci also shared concerns in regards to patient's nutritional status.  Reviewed functional, nutritional, and cognitive status is as helpful indicators of prognosis. Discussed with patient that food is for fuel and not fun at this point.  Patient nodded and shared she is going to "try".   Patient shares she would like her daughter to be the sole Marine scientist.  Currently, patient has 3 adult children who would need to have a majority agreement in order to make decisions in the event that patient is unable to speak for herself.  Reviewed patient could name 1 decision maker with alternatives if she chooses.  Spiritual care consult placed.  Copy of advance directive given for patient and daughter to review.  Goals are clear.  Full code and full scope remains. Patient is accepting of any and all offered, available, and appropriate medications and interventions to sustain her life.    PMT will peripherally monitor the patient and re-engage at patient/family's request, if goals change, or if patient's health deteriorates.  Code  Status: Full code  Prognosis: Unable to determine  Discharge Planning: Home with [place] reviewed.  Constitutional:      General: She is not in acute distress.    Appearance: She is not toxic-appearing.  HENT:     Head: Normocephalic.  Cardiovascular:     Rate and Rhythm: Normal rate.  Pulmonary:     Effort: Pulmonary effort is normal.  Abdominal:     Palpations: Abdomen is soft.  Musculoskeletal:     Comments: Generalized weakness  Skin:    General: Skin is warm and dry.  Neurological:     Mental Status: She is alert and oriented to person, place, and time.  Psychiatric:        Mood and Affect: Mood normal. Mood is not anxious.        Behavior: Behavior normal. Behavior is not agitated.             Palliative Assessment/Data: 40%    Total Time 50 minutes  Greater than 50%  of this time was spent counseling and coordinating care related to the above assessment and plan.  Thank you for allowing the Palliative Medicine Team to assist in the care of this patient.  [place] Ilsa Iha, FNP-BC Palliative Medicine Team Team Phone # [phone]

## 2021-10-17 NOTE — Progress Notes (Addendum)
[facility] Cardiology  Patient ID: Zoe Boyle MRN: [id number] DOB/AGE: 1940/02/28 81 y.o.   Admit date: 10/11/2021 Referring Physician Dr. Shelly Coss Primary Physician Dr. Tressia Miners  Primary Cardiologist Dr. Clayborn Bigness Reason for Consultation acute on chronic HFrEF   HPI: Zoe Boyle is a 7yoF with a PMH of dilated cardiomyopathy, HFrEF (LVEF 25-30% with global hypo, severe MR 08/02/2021), small pericardial effusion, hypertension, hyperlipidemia, CKD 3 who presented to [facility] ED 10/11/2021 with shortness of breath and associated leg swelling.  Cardiology is consulted on hospital day 2 for assistance with her heart failure.  Interval History: -Better diuresis overnight after increasing Lasix drip and metolazone x1 on 9/18 -Feels better than yesterday, even smiles during our encounter.  Reports better breathing, and she actually does appear less short of breath today -Lower extremity edema has slightly improved   Vitals:   10/17/21 1030 10/17/21 1100 10/17/21 1200 10/17/21 1300  BP: 118/81 107/73 110/81 98/83  Pulse:   73 67  Resp: (!) 24 (!) 33 (!) 34 16  Temp:   97.7 F (36.5 C)   TempSrc:   Axillary   SpO2: 100% 100% 100% 99%  Weight:      Height:         Intake/Output Summary (Last 24 hours) at 10/17/2021 1321 Last data filed at 10/17/2021 1200 Gross per 24 hour  Intake 893.78 ml  Output 1515 ml  Net -621.22 ml     PHYSICAL EXAM General: Elderly and frail-appearing black female, in no acute distress. Sitting upright in ICU bed, daughter at bedside HEENT:  Normocephalic and atraumatic. Neck:   No JVD.  Lungs: Normal work of breathing on [place] oxygen.  decreased breath sounds R base, crackles on L without wheezes Heart: regular rate with frequent premature beats . Normal S1 and S2.  2/6 systolic murmur best heard at the apex.  Abdomen: Non-distended appearing.  Msk: generalized weakness  Extremities: Warm to touch. No clubbing, cyanosis.  1-2+ pitting edema bilateral lower  extremities.  Neuro: Alert and oriented X 3. Psych:  Answers questions appropriately.    LABS: Basic Metabolic Panel: Recent Labs    10/16/21 0352 10/16/21 0717 10/16/21 1354 10/17/21 0547  NA 137   < > 139 140  K 5.7*   < > 5.8* 4.0  CL 106   < > 106 103  CO2 9*   < > 19* 24  GLUCOSE 135*   < > 150* 110*  BUN 74*   < > 76* 79*  CREATININE 2.70*   < > 2.53* 2.43*  CALCIUM 9.5   < > 8.9 9.3  MG 2.9*  --   --   --    < > = values in this interval not displayed.    Liver Function Tests: No results for input(s): "AST", "ALT", "ALKPHOS", "BILITOT", "PROT", "ALBUMIN" in the last 72 hours. No results for input(s): "LIPASE", "AMYLASE" in the last 72 hours. CBC: Recent Labs    10/16/21 1354 10/17/21 0547  WBC 8.0 6.0  NEUTROABS 5.3 3.3  HGB 11.2* 11.3*  HCT 34.9* 34.1*  MCV 82.7 79.3*  PLT 128* 118*    Cardiac Enzymes: No results for input(s): "CKTOTAL", "CKMB", "CKMBINDEX", "TROPONINI" in the last 72 hours. BNP: Invalid input(s): "POCBNP" D-Dimer: Recent Labs    10/16/21 0352  DDIMER 4.76*    Hemoglobin A1C: No results for input(s): "HGBA1C" in the last 72 hours. Fasting Lipid Panel: No results for input(s): "CHOL", "HDL", "LDLCALC", "TRIG", "CHOLHDL", "LDLDIRECT" in the last 72 hours.  Thyroid Function Tests: No results for input(s): "TSH", "T4TOTAL", "T3FREE", "THYROIDAB" in the last 72 hours.  Invalid input(s): "FREET3" Anemia Panel: No results for input(s): "VITAMINB12", "FOLATE", "FERRITIN", "TIBC", "IRON", "RETICCTPCT" in the last 72 hours.  DG Chest Port 1 View  Result Date: 10/16/2021 CLINICAL DATA:  Acute respiratory failure EXAM: PORTABLE CHEST 1 VIEW COMPARISON:  Chest x-ray 10/11/2020 FINDINGS: There is marked enlargement of the cardiac silhouette, unchanged. There is central pulmonary vascular congestion. Small pleural effusions are present, increasing on the right. Bibasilar opacities persist. There is no pneumothorax or acute fracture.  IMPRESSION: 1. Bilateral pleural effusions, increasing on the right. 2. Cardiomegaly with central pulmonary vascular congestion. 3. Bilateral airspace disease appears stable. Electronically Signed   By: Ronney Asters M.D.   On: 10/16/2021 03:24     Echo EF 30-35%, severe biatrial enlargement, moderate to severe mitral and tricuspid regurgitation  TELEMETRY: SR frequent PACs and short runs of NSVT 4-5 beats rate mostly 70s to 80s  EKG: 9/18, SR LBBB frequent PACs   Data reviewed by me (LT) 9/19: Palliative care note, nephrology note, CBC, BMP, I's and O's, telemetry, vitals  ASSESSMENT AND PLAN:  Principal Problem:   Acute on chronic systolic CHF (congestive heart failure) (HCC) Active Problems:   Acute renal failure superimposed on stage 3a chronic kidney disease (HCC)   Hypertension   Depression   Thrombocytopenia (HCC)   Protein-calorie malnutrition, severe    1. Acute on chronic systolic congestive heart failure, HFrEF EF 25-30% 08/02/2021, slight clinical improvement after initial diuresis, with resultant worsening renal function, seen by nephrology, started on furosemide infusion, worsening respiratory status overnight on 9/17 resulting in transfer to stepdown. 2.  Acute renal failure with underlying CKD stage III, followed by nephrology 3.  Elevated troponin (30, 32, 28), most consistent with myocardial injury due to chronic kidney disease without ischemia 4.  Chronic LBBB 5.  Moderate to severe mitral and tricuspid regurgitation 6.  Moderate right pleural effusion   Recommendations   1.  Agree current therapy 2.  Continue furosemide infusion 10 mg/h, will dose another metolazone 5 mg x 1 3.  Hold beta-blocker with decompensated HFrEF, favor restarting this tomorrow at a low dose as her blood pressure allows 4.  Carefully monitor renal status, nephrology to follow 5.  Consider adding empagliflozin if renal status stabilizes which can be done as outpatient 6.  Outpatient  evaluation for CRT-D per Dr. Clayborn Bigness 7.   Appreciate palliative involvement to multiple comorbidities and worsening heart failure and renal function. 8.  Discussed plan of care with her daughter at bedside today   This patient's plan of care was discussed and created with Dr. Nehemiah Massed and he is in agreement.    Tristan Schroeder, PA-C 10/17/2021 1:21 PM  The patient is much more responsive today and able to converse more.  Discussion with the family suggest that the patient has been very active prior to this hospitalization.  Therefore further treatment options are important.  The patient does have continued chronic kidney disease now followed by nephrology and no current evidence of acute coronary syndrome.  The patient does have some irregularities of her heartbeat although there appears not to be any significant ventricular rhythms.  Currently she is maximized on carvedilol and taking maximum dose of diuretic without causing worsening renal status.  Further treatment with ACE inhibitor Entresto dapagliflozin are deferred at this time due to renal failure and dysfunction and will be reassessed at a later date upon discharge.  The patient has been interviewed and examined. I agree with assessment and plan above. Serafina Royals MD [facility]

## 2021-10-17 NOTE — Progress Notes (Signed)
Progress Note   Patient: Zoe Boyle EHU:[id number] DOB: January 04, 1941 DOA: 10/11/2021     6 DOS: the patient was seen and examined on 10/17/2021   Brief hospital course: Mrs. Matos was admitted to the hospital with the working diagnosis of decompensated heart failure.   81 yo female with the past medical history of congested heart failure, hypertension, dyslipidemia and CKD stage 3a who presented with dyspnea and lower extremity edema. Reported 3 to 4 days of dyspnea, lower extremity edema despite taking diuretic therapy at home. Recent hospitalization 06/11 to 07/13/21 for group A strep bacteremia and 07/23 for heart failure. On her initial physical examination her blood pressure was 99/77, rr 24 to 32, HR 72 and 02 saturation 96% on room air, she had increased work of breathing, heart with S1 and S2 present and rhythmic, abdomen not distended and positive lower extremity edema.   NA 137, K 3,4 CL 106, bicarbonate 18, glucose 160 un 53 cr 1,72  High sensitive troponin 46 and 40  Wbc 4,7 hgb 121 plt 86   Chest radiograph with significant cardiomegaly, bilateral hilar vascular congestion and bilateral small pleural effusions.   EKG 79 bpm, right axis, qtc 525, left bundle branch block, sinus rhythm with first degree AV block, J point elevation V1 to V3, Boyle significant T wave changes.  Patient was placed on furosemide for diuresis. Echocardiogram with reduced LV systolic function.      Assessment and Plan: * Acute on chronic systolic CHF (congestive heart failure) (HCC) Echocardiogram with LV systolic function 30 to 78%, mild dilatated LV cavity, RV systolic function preserved, severe dilatation LA and RA, severe TR.   Documented urine output is 300 cc Patient continue volume overloaded.  Systolic blood pressure 588 to 118 mmHg.  Follow up chest film personally reviewed with worsening acute pulmonary edema.  Plan to increase furosemide to 10 mg per hr infusion Add one dose of  metolazone Hold on RAS inhibition due to worsening renal function Hold on B blockade due to decompensated heart failure.   Acute hypoxemic respiratory failure due to cardiogenic pulmonary edema.  Her 02 saturation today is 100% on 2 L/min per [place]   Acute renal failure superimposed on stage 3a chronic kidney disease (HCC) Metabolic Acidosis/ hyperkalemia.   This am pH 7,18/ 20/76/7.5/94% Serum cr is 2,6, with K at 5,2 and serum bicarbonate at 14. BUN 80.  Lactic acid >9.0   One dose of patiromer and 1 amps of bicarbonate Plan to continue diuresis with furosemide and add one dose of metolazone.  Follow up VBG this pm Doubt patient is candidate for HD considering her heart failure.  Place foley cathter to close follow up urine output.   Hypertension \\Hold  antihypertensive medications to prevent hypotension.   Protein-calorie malnutrition, severe Continue with nutritional supplements.   Thrombocytopenia (HCC) Leukopenia.  Follow up hgb is 13, with plt at 120. Wbc is 3,8 Hold on aspirin.   Patient had a bloody bowel movement this am, she has history of hemorrhoids Hold aspirin for now and follow up on cell count this pm.    Depression Continue mirtazapine  Elevated troponin Troponin of 40 likely secondary to demand ischemia Continue to trend Continue and Pravastatin          Subjective: Patient seen in stepdown, daughter at bedside meeting with palliative care.  Pt reports feeling okay, offers Boyle acute complaints.  She states her goal is to "get back to walking", and wants to do what she needs  to make that happen.    Physical Exam: Vitals:   10/17/21 1300 10/17/21 1500 10/17/21 1600 10/17/21 1700  BP: 98/83 (!) 116/90 106/80 111/83  Pulse: 67 74 68   Resp: 16 (!) 36 (!) 23 (!) 38  Temp:      TempSrc:      SpO2: 99% 98% 100% 99%  Weight:      Height:       General exam: sleeping but wakes easily to voice, Boyle acute distress HEENT: moist mucus membranes, hearing  grossly normal  Respiratory system: CTAB with diminished bases, Boyle wheezes or rhonchi, normal respiratory effort. Cardiovascular system: normal S1/S2, RRR   Gastrointestinal system: soft, NT, ND Central nervous system: A&O x3. Boyle gross focal neurologic deficits, normal speech Extremities: trace+ BLE edema, normal tone Skin: dry, intact, normal temperature Psychiatry: normal mood, congruent affect, judgement and insight appear normal d  Data Reviewed: Notable labs ---  K 4.0 (from 5.8), glucose 110, BUN 79, Cr 2.43 from 2.53, Hbg 11.3, lactic acid 2.9 (from > 9.0), platelets 118k from 128k,   Family Communication: daughter at the bedside on rounds, meeting with Palliative Care   Disposition: Status is: Inpatient Remains inpatient appropriate because: remains on Lasix drip   Planned Discharge Destination: Home     Critical care Time spent: 45 minutes.  Patient critically ill with decompensated heart failure, remains on Lasix drip  Patient is high risk for morbidity and mortality    Author: Ezekiel Slocumb, DO 10/17/2021 6:51 PM  For on call review [email or web address].

## 2021-10-17 NOTE — Progress Notes (Signed)
[facility]  ROUNDING NOTE   Subjective:   Patient seen and evaluated at bedside in ICU Somnolent this morning  Remains on 2L [place] lower extremity edema  Creatinine 2.43 Urine output 1.2L in preceding 24 hours. Furosemide drip at 10 mg/h  Objective:  Vital signs in last 24 hours:  Temp:  [97.7 F (36.5 C)-98.1 F (36.7 C)] 98 F (36.7 C) (09/19 0800) Pulse Rate:  [37-83] 54 (09/19 0800) Resp:  [15-38] 30 (09/19 0800) BP: (83-129)/(59-88) 111/85 (09/19 0800) SpO2:  [93 %-100 %] 99 % (09/19 0800) Weight:  [48.9 kg] 48.9 kg (09/19 0500)  Weight change:  Filed Weights   10/14/21 0515 10/15/21 0432 10/17/21 0500  Weight: 53.7 kg 54.4 kg 48.9 kg    Intake/Output: I/O last 3 completed shifts: In: 1360.7 [P.O.:1030; I.V.:160.7; IV Piggyback:170] Out: 1610 [Urine:1305]   Intake/Output this shift:  Total I/O In: 5 [I.V.:5] Out: 175 [Urine:175]  Physical Exam: General: NAD  Head: Normocephalic, atraumatic.  Moist oral mucosal membranes  Eyes: Anicteric  Lungs:  Diminished in bases, normal effort  Heart: Regular rate and rhythm  Abdomen:  Soft, nontender  Extremities:  trace peripheral edema.  Neurologic: Nonfocal, moving all four extremities  Skin: No lesions  Access: None    Basic Metabolic Panel: Recent Labs  Lab 10/13/21 0604 10/14/21 0749 10/15/21 0517 10/16/21 0352 10/16/21 0717 10/16/21 1354 10/17/21 0547  NA 138   < > 138 137 142 139 140  K 4.9   < > 4.9 5.7* 5.2* 5.8* 4.0  CL 109   < > 110 106 106 106 103  CO2 18*   < > 16* 9* 14* 19* 24  GLUCOSE 118*   < > 102* 135* 90 150* 110*  BUN 57*   < > 65* 74* 80* 76* 79*  CREATININE 1.84*   < > 1.87* 2.70* 2.62* 2.53* 2.43*  CALCIUM 9.1   < > 9.4 9.5 9.5 8.9 9.3  MG 2.2  --   --  2.9*  --   --   --    < > = values in this interval not displayed.     Liver Function Tests: No results for input(s): "AST", "ALT", "ALKPHOS", "BILITOT", "PROT", "ALBUMIN" in the last 168 hours. No  results for input(s): "LIPASE", "AMYLASE" in the last 168 hours. No results for input(s): "AMMONIA" in the last 168 hours.  CBC: Recent Labs  Lab 10/14/21 0752 10/15/21 0517 10/16/21 0352 10/16/21 1354 10/17/21 0547  WBC 4.7 4.3 3.8* 8.0 6.0  NEUTROABS  --   --   --  5.3 3.3  HGB 12.2 12.2 13.0 11.2* 11.3*  HCT 38.5 36.6 42.6 34.9* 34.1*  MCV 81.9 81.5 85.0 82.7 79.3*  PLT 119* 140* 120* 128* 118*     Cardiac Enzymes: No results for input(s): "CKTOTAL", "CKMB", "CKMBINDEX", "TROPONINI" in the last 168 hours.  BNP: Invalid input(s): "POCBNP"  CBG: Recent Labs  Lab 10/16/21 0249 10/16/21 0301 10/16/21 0359  GLUCAP 64* 282* 117*     Microbiology: Results for orders placed or performed during the hospital encounter of 10/11/21  SARS Coronavirus 2 by RT PCR (hospital order, performed in [facility] hospital lab) *cepheid single result test* Anterior Nasal Swab     Status: None   Collection Time: 10/16/21  4:18 AM   Specimen: Anterior Nasal Swab  Result Value Ref Range Status   SARS Coronavirus 2 by RT PCR NEGATIVE NEGATIVE Final    Comment: (NOTE) SARS-CoV-2 target nucleic acids  are NOT DETECTED.  The SARS-CoV-2 RNA is generally detectable in upper and lower respiratory specimens during the acute phase of infection. The lowest concentration of SARS-CoV-2 viral copies this assay can detect is 250 copies / mL. A negative result does not preclude SARS-CoV-2 infection and should not be used as the sole basis for treatment or other patient management decisions.  A negative result may occur with improper specimen collection / handling, submission of specimen other than nasopharyngeal swab, presence of viral mutation(s) within the areas targeted by this assay, and inadequate number of viral copies (<250 copies / mL). A negative result must be combined with clinical observations, patient history, and epidemiological information.  Fact Sheet for Patients:    https://www.patel.info/  Fact Sheet for Healthcare Providers: https://hall.com/  This test is not yet approved or  cleared by the [place] FDA and has been authorized for detection and/or diagnosis of SARS-CoV-2 by FDA under an Emergency Use Authorization (EUA).  This EUA will remain in effect (meaning this test can be used) for the duration of the COVID-19 declaration under Section 564(b)(1) of the Act, 21 U.S.C. section 360bbb-3(b)(1), unless the authorization is terminated or revoked sooner.  Performed at [facility], [address] [place], [place] [address]     Coagulation Studies: No results for input(s): "LABPROT", "INR" in the last 72 hours.  Urinalysis: No results for input(s): "COLORURINE", "LABSPEC", "PHURINE", "GLUCOSEU", "HGBUR", "BILIRUBINUR", "KETONESUR", "PROTEINUR", "UROBILINOGEN", "NITRITE", "LEUKOCYTESUR" in the last 72 hours.  Invalid input(s): "APPERANCEUR"    Imaging: DG Chest Port 1 View  Result Date: 10/16/2021 CLINICAL DATA:  Acute respiratory failure EXAM: PORTABLE CHEST 1 VIEW COMPARISON:  Chest x-ray 10/11/2020 FINDINGS: There is marked enlargement of the cardiac silhouette, unchanged. There is central pulmonary vascular congestion. Small pleural effusions are present, increasing on the right. Bibasilar opacities persist. There is no pneumothorax or acute fracture. IMPRESSION: 1. Bilateral pleural effusions, increasing on the right. 2. Cardiomegaly with central pulmonary vascular congestion. 3. Bilateral airspace disease appears stable. Electronically Signed   By: Ronney Asters M.D.   On: 10/16/2021 03:24     Medications:    sodium chloride Stopped (10/16/21 1403)   furosemide (LASIX) 200 mg in dextrose 5 % 100 mL (2 mg/mL) infusion 10 mg/hr (10/17/21 0800)   promethazine (PHENERGAN) injection (IM or IVPB)      acidophilus  1 capsule Oral Daily   Chlorhexidine Gluconate Cloth  6 each  Topical Daily   feeding supplement  237 mL Oral TID BM   heparin injection (subcutaneous)  5,000 Units Subcutaneous Q8H   megestrol  320 mg Oral Daily   mirtazapine  15 mg Oral QHS   multivitamin with minerals  1 tablet Oral Daily   pravastatin  20 mg Oral Daily   sodium chloride, acetaminophen **OR** acetaminophen, dextrose, ondansetron **OR** ondansetron (ZOFRAN) IV, mouth rinse, promethazine (PHENERGAN) injection (IM or IVPB)  Assessment/ Plan:  Ms. Zoe Boyle is a 81 y.o.  female with medical problems of chronic systolic CHF with EF of 25 to 30% from June 2023, hypertension, hyperlipidemia, chronic kidney disease, anemia, depression, hospitalization from strep bacteremia in July 2023,   was admitted on 10/11/2021 for CHF (congestive heart failure) ([place]) [I50.9] Acute on chronic congestive heart failure, unspecified heart failure type ([place]) [I50.9]   Acute kidney injury in the setting of multiple valvular abnormalities, severe systolic CHF and chronic kidney disease stage IIIb with baseline creatinine 1.45 from August 04, 2021/GFR 37, lower extremity edema with volume overload.  Creatinine slightly improved today with increased urine output. Due to recent improvements, no acute need for dialysis at this time. Will continue to monitor. Recommend palliative care to determine goals and aggressive care measures.  Lab Results  Component Value Date   CREATININE 2.43 (H) 10/17/2021   CREATININE 2.53 (H) 10/16/2021   CREATININE 2.62 (H) 10/16/2021    Intake/Output Summary (Last 24 hours) at 10/17/2021 0929 Last data filed at 10/17/2021 0800 Gross per 24 hour  Intake 1159.74 ml  Output 1330 ml  Net -170.26 ml    2.  Acute on chronic systolic heart failure, unspecified heart failure type ([place]) [I50.9] 2D echo July 2023: LVEF 25 to 30%, global hypokinesis, moderately dilated left ventricular internal cavity, severely dilated left atrium, small pericardial effusion, severe mitral regurgitation,  severe tricuspid regurgitation, moderately elevated pulmonary artery systolic pressure.  Cardiology is following and recommend CRT-D outpatient with Dr. Clayborn Bigness.  Furosemide drip increased on 10/16/21 with Metolazone. Adequate urine output recorded.   3.  Hypertension with chronic kidney disease stage IIIb.  Home regimen includes carvedilol, spironolactone, and torsemide.  All currently held.      4. Anemia of chronic kidney disease Normocytic Lab Results  Component Value Date   HGB 11.3 (L) 10/17/2021    Hgb within target.   LOS: 6 Zoe Boyle 9/19/20239:29 AM

## 2021-10-17 NOTE — Plan of Care (Signed)
  Problem: Education: Goal: Knowledge of General Education information will improve Description: Including pain rating scale, medication(s)/side effects and non-pharmacologic comfort measures Outcome: Progressing   Problem: Health Behavior/Discharge Planning: Goal: Ability to manage health-related needs will improve Outcome: Progressing   Problem: Clinical Measurements: Goal: Ability to maintain clinical measurements within normal limits will improve Outcome: Progressing Goal: Will remain free from infection Outcome: Progressing Goal: Diagnostic test results will improve Outcome: Progressing Goal: Respiratory complications will improve Outcome: Progressing Goal: Cardiovascular complication will be avoided Outcome: Progressing   Problem: Activity: Goal: Risk for activity intolerance will decrease Outcome: Progressing   Problem: Nutrition: Goal: Adequate nutrition will be maintained Outcome: Progressing   Problem: Coping: Goal: Level of anxiety will decrease Outcome: Progressing   Problem: Elimination: Goal: Will not experience complications related to bowel motility Outcome: Progressing Goal: Will not experience complications related to urinary retention Outcome: Progressing   Problem: Pain Managment: Goal: General experience of comfort will improve Outcome: Progressing   Problem: Safety: Goal: Ability to remain free from injury will improve Outcome: Progressing   Problem: Skin Integrity: Goal: Risk for impaired skin integrity will decrease Outcome: Progressing   Problem: Education: Goal: Ability to demonstrate management of disease process will improve Outcome: Progressing Goal: Ability to verbalize understanding of medication therapies will improve Outcome: Progressing Goal: Individualized Educational Video(s) Outcome: Progressing   Problem: Activity: Goal: Capacity to carry out activities will improve Outcome: Progressing   Problem: Cardiac: Goal:  Ability to achieve and maintain adequate cardiopulmonary perfusion will improve Outcome: Progressing   Problem: Education: Goal: Ability to demonstrate management of disease process will improve Outcome: Progressing Goal: Ability to verbalize understanding of medication therapies will improve Outcome: Progressing Goal: Individualized Educational Video(s) Outcome: Progressing   Problem: Activity: Goal: Capacity to carry out activities will improve Outcome: Progressing   Problem: Cardiac: Goal: Ability to achieve and maintain adequate cardiopulmonary perfusion will improve Outcome: Progressing   

## 2021-10-17 NOTE — Progress Notes (Signed)
   10/17/21 1500  Clinical Encounter Type  Visited With Patient  Visit Type Initial  Referral From Nurse  Consult/Referral To Chaplain   Chaplain responded to nurse consult. Chaplain provided a compassionate presence and reflective listening as patient spoke about hospital stay. Patient spoke about her family and daughter Zoe Boyle. Chaplain provided AD paperwork and discussed completing them. Chaplain is available for follow up as needed.

## 2021-10-18 ENCOUNTER — Inpatient Hospital Stay: Payer: Medicare Other

## 2021-10-18 DIAGNOSIS — I471 Supraventricular tachycardia: Secondary | ICD-10-CM | POA: Diagnosis not present

## 2021-10-18 DIAGNOSIS — Z7189 Other specified counseling: Secondary | ICD-10-CM | POA: Diagnosis not present

## 2021-10-18 DIAGNOSIS — I5023 Acute on chronic systolic (congestive) heart failure: Secondary | ICD-10-CM | POA: Diagnosis not present

## 2021-10-18 LAB — D-DIMER, QUANTITATIVE: D-Dimer, Quant: 1.78 ug/mL-FEU — ABNORMAL HIGH (ref 0.00–0.50)

## 2021-10-18 LAB — BASIC METABOLIC PANEL
Anion gap: 14 (ref 5–15)
BUN: 82 mg/dL — ABNORMAL HIGH (ref 8–23)
CO2: 26 mmol/L (ref 22–32)
Calcium: 9.3 mg/dL (ref 8.9–10.3)
Chloride: 97 mmol/L — ABNORMAL LOW (ref 98–111)
Creatinine, Ser: 2.17 mg/dL — ABNORMAL HIGH (ref 0.44–1.00)
GFR, Estimated: 23 mL/min — ABNORMAL LOW (ref >60)
Glucose, Bld: 125 mg/dL — ABNORMAL HIGH (ref 70–99)
Potassium: 3 mmol/L — ABNORMAL LOW (ref 3.5–5.1)
Sodium: 137 mmol/L (ref 135–145)

## 2021-10-18 LAB — URIC ACID: Uric Acid, Serum: 12.9 mg/dL — ABNORMAL HIGH (ref 2.5–7.1)

## 2021-10-18 LAB — MAGNESIUM
Magnesium: 2.1 mg/dL (ref 1.7–2.4)
Magnesium: 2.2 mg/dL (ref 1.7–2.4)

## 2021-10-18 LAB — CBC
HCT: 34.5 % — ABNORMAL LOW (ref 36.0–46.0)
Hemoglobin: 11.5 g/dL — ABNORMAL LOW (ref 12.0–15.0)
MCH: 26 pg (ref 26.0–34.0)
MCHC: 33.3 g/dL (ref 30.0–36.0)
MCV: 78.1 fL — ABNORMAL LOW (ref 80.0–100.0)
Platelets: 123 10*3/uL — ABNORMAL LOW (ref 150–400)
RBC: 4.42 MIL/uL (ref 3.87–5.11)
RDW: 16.1 % — ABNORMAL HIGH (ref 11.5–15.5)
WBC: 5.3 10*3/uL (ref 4.0–10.5)
nRBC: 3.2 % — ABNORMAL HIGH (ref 0.0–0.2)

## 2021-10-18 LAB — LACTIC ACID, PLASMA: Lactic Acid, Venous: 1.6 mmol/L (ref 0.5–1.9)

## 2021-10-18 LAB — POTASSIUM
Potassium: 2.8 mmol/L — ABNORMAL LOW (ref 3.5–5.1)
Potassium: 4.9 mmol/L (ref 3.5–5.1)

## 2021-10-18 LAB — TROPONIN I (HIGH SENSITIVITY): Troponin I (High Sensitivity): 89 ng/L — ABNORMAL HIGH (ref <18)

## 2021-10-18 MED ORDER — CARVEDILOL 6.25 MG PO TABS: 6.2500 mg | ORAL_TABLET | Freq: Two times a day (BID) | ORAL | Status: DC

## 2021-10-18 MED ORDER — POTASSIUM CHLORIDE 10 MEQ/100ML IV SOLN
10.0000 meq | INTRAVENOUS | Status: AC
  Administered 2021-10-18 (×3): 10 meq via INTRAVENOUS
  Filled 2021-10-18 (×3): qty 100

## 2021-10-18 MED ORDER — CARVEDILOL 3.125 MG PO TABS
3.1250 mg | ORAL_TABLET | Freq: Two times a day (BID) | ORAL | Status: DC
  Administered 2021-10-18: 3.125 mg via ORAL
  Filled 2021-10-18: qty 1

## 2021-10-18 MED ORDER — CARVEDILOL 3.125 MG PO TABS
3.1250 mg | ORAL_TABLET | Freq: Once | ORAL | Status: AC
  Administered 2021-10-18: 3.125 mg via ORAL
  Filled 2021-10-18: qty 1

## 2021-10-18 MED ORDER — FUROSEMIDE 10 MG/ML IJ SOLN
10.0000 mg/h | INTRAVENOUS | Status: DC
  Administered 2021-10-19 – 2021-10-20 (×2): 10 mg/h via INTRAVENOUS
  Filled 2021-10-18 (×2): qty 20

## 2021-10-18 MED ORDER — NEPRO/CARBSTEADY PO LIQD
237.0000 mL | Freq: Three times a day (TID) | ORAL | Status: DC
  Administered 2021-10-18 – 2021-10-20 (×4): 237 mL via ORAL

## 2021-10-18 MED ORDER — POTASSIUM CHLORIDE 20 MEQ PO PACK
40.0000 meq | PACK | Freq: Once | ORAL | Status: AC
  Administered 2021-10-18: 40 meq via ORAL
  Filled 2021-10-18: qty 2

## 2021-10-18 MED ORDER — POTASSIUM CHLORIDE 20 MEQ PO PACK
40.0000 meq | PACK | Freq: Two times a day (BID) | ORAL | Status: AC
  Administered 2021-10-18 (×2): 40 meq via ORAL
  Filled 2021-10-18 (×2): qty 2

## 2021-10-18 NOTE — Progress Notes (Signed)
Nutrition Follow-up  DOCUMENTATION CODES:   Severe malnutrition in context of chronic illness  INTERVENTION:   Nepro Shake po TID, each supplement provides 425 kcal and 19 grams protein  Magic cup TID with meals, each supplement provides 290 kcal and 9 grams of protein  MVI po daily   Liberalize diet  Pt at high refeed risk; recommend monitor potassium, magnesium and phosphorus labs daily until stable  NUTRITION DIAGNOSIS:   Severe Malnutrition related to chronic illness (CHF) as evidenced by severe muscle depletion, severe fat depletion, edema.  GOAL:   Patient will meet greater than or equal to 90% of their needs -not met   MONITOR:   PO intake, Supplement acceptance, Labs, Weight trends, Skin, I & O's  ASSESSMENT:   81 y/o female with h/o medical history significant of systolic CHF (EF 25 to 49% June 2023), HTN, HLD, CKD 3A, MI, anemia, depression and who was hospitalized back in June from 6/11 to 6/15 with group A strep bacteremia and then in July with CHF exacerbation and who is now admittted with CHF and AKI.  Met with pt in room today. Pt with poor appetite and oral intake since admission; pt eating only sips/bites of meals. Pt is refusing the Ensure supplements as she reports that they give her diarrhea. Pt reports today that she has no appetite or desire to eat. Pt initiated on remeron and megace with no improvement in oral intake. Pt has now been without adequate nutrition for 7 days. RD discussed with pt the importance of adequate nutrition needed to preserve lean muscle. Pt is willing to try some different supplements (prefers berry flavors). Per chart, pt is down ~2lbs since admission but appears to be back at her UBW. Discussed pt's nutritional status with MD. NGT could be placed in the short term to provide nutritional support but this would not be beneficial for pt in the long term as pt with chronic malnutrition and poor oral intake. G-tube would help pt meet her  nutritional needs but would not likely improve pt's quality of life as pt with recurrent CHF. Palliative care is following for GOC.   Medications reviewed and include: risaquad, heparin, megace, remeron, MVI, lasix  Labs reviewed: K 3.0(L), BUN 82(H), creat 2.17(H), Mg 2.1 wnl BNP >4500(H)- 9/18  Diet Order:   Diet Order             Diet regular Room service appropriate? Yes; Fluid consistency: Thin  Diet effective now                  EDUCATION NEEDS:   No education needs have been identified at this time  Skin:  Skin Assessment: Reviewed RN Assessment  Last BM:  9/19- type 7  Height:   Ht Readings from Last 1 Encounters:  10/11/21 5' (1.524 m)    Weight:   Wt Readings from Last 1 Encounters:  10/18/21 49.5 kg    Ideal Body Weight:  45.5 kg  BMI:  Body mass index is 21.31 kg/m.  Estimated Nutritional Needs:   Kcal:  1400-1600kcal/day  Protein:  70-80g/day  Fluid:  1.3-1.5L/day  Koleen Distance MS, RD, LDN Please refer to [facility] for RD and/or RD on-call/weekend/after hours pager

## 2021-10-18 NOTE — Progress Notes (Addendum)
Patient with increased ectopic beats and non-sustained tachycardia Orinda Kenner, NP called and notified. NP to make adjustments to medications and will come and round on patient again. This nurse asked to repeat serum potassium.  Order placed by NP.

## 2021-10-18 NOTE — Progress Notes (Addendum)
[facility] Cardiology  Patient ID: Zoe Boyle MRN: [id number] DOB/AGE: [birth year] 81 y.o.   Admit date: 10/11/2021 Referring Physician Dr. Shelly Coss Primary Physician Dr. Tressia Miners  Primary Cardiologist Dr. Clayborn Bigness Reason for Consultation acute on chronic HFrEF   HPI: [name] is a 81yoF with a PMH of dilated cardiomyopathy, HFrEF (LVEF 25-30% with global hypo, severe MR 08/02/2021), small pericardial effusion, hypertension, hyperlipidemia, CKD 3 who presented to [facility] ED 10/11/2021 with shortness of breath and associated leg swelling.  Cardiology is consulted on hospital day 2 for assistance with her heart failure.  Interval History: -Renal function continues to improve on Lasix infusion with addition of metolazone again yesterday, excellent urine output and is net -4 L -Only complaint is left knee pain, denies shortness of breath, chest pain, dizziness, palpitations -Remains on supplemental oxygen  Addendum :  - discussed with nursing re: increased frequency of paroxysms of nonsustained tachycardia (HR up to 130s, nonsustained), most consistent with sinus tach with PACs (repeated 12 lead and confirmed with Dr. Nehemiah Massed) -Plan: Mag 2.1, will recheck K+ (was 3.0 before repletion this morning) and replete as needed. Increase coreg to 6.25mg  BID.   Vitals:   10/18/21 0550 10/18/21 0600 10/18/21 0700 10/18/21 0740  BP: 111/73 112/68 126/78   Pulse: 94 87 (!) 49   Resp: (!) 30 (!) 26 (!) 48 (!) 32  Temp:    (!) 97.5 F (36.4 C)  TempSrc:    Axillary  SpO2: 93% 93% 95% 95%  Weight:      Height:         Intake/Output Summary (Last 24 hours) at 10/18/2021 0931 Last data filed at 10/18/2021 0740 Gross per 24 hour  Intake 585.03 ml  Output 4190 ml  Net -3604.97 ml     PHYSICAL EXAM General: Elderly and frail-appearing black female, in no acute distress. Sitting upright in ICU bed, with her nurse at bedside HEENT:  Normocephalic and atraumatic. Neck:   No JVD.  Lungs: Normal work of  breathing on [place] oxygen.  decreased breath sounds R base, crackles on L without wheezes Heart: regular rate with frequent premature beats with short runs of tachycardia before returning to normal rate. Normal S1 and S2.  2/6 systolic murmur best heard at the apex.  Abdomen: Non-distended appearing.  Msk: generalized weakness  Extremities: Right knee with tenderness to light palpation, swollen compared to left knee.  Warm to touch. No clubbing, cyanosis.  1+ pitting pedal edema, overall improving. Neuro: Alert and oriented X 3. Psych:  Answers questions appropriately but is a limited historian.Marland Kitchen    LABS: Basic Metabolic Panel: Recent Labs    10/16/21 0352 10/16/21 0717 10/17/21 0547 10/18/21 0417  NA 137   < > 140 137  K 5.7*   < > 4.0 3.0*  CL 106   < > 103 97*  CO2 9*   < > 24 26  GLUCOSE 135*   < > 110* 125*  BUN 74*   < > 79* 82*  CREATININE 2.70*   < > 2.43* 2.17*  CALCIUM 9.5   < > 9.3 9.3  MG 2.9*  --   --   --    < > = values in this interval not displayed.    Liver Function Tests: No results for input(s): "AST", "ALT", "ALKPHOS", "BILITOT", "PROT", "ALBUMIN" in the last 72 hours. No results for input(s): "LIPASE", "AMYLASE" in the last 72 hours. CBC: Recent Labs    10/16/21 1354 10/17/21 0547 10/18/21 0417  WBC 8.0 6.0 5.3  NEUTROABS 5.3 3.3  --   HGB 11.2* 11.3* 11.5*  HCT 34.9* 34.1* 34.5*  MCV 82.7 79.3* 78.1*  PLT 128* 118* 123*    Cardiac Enzymes: No results for input(s): "CKTOTAL", "CKMB", "CKMBINDEX", "TROPONINI" in the last 72 hours. BNP: Invalid input(s): "POCBNP" D-Dimer: Recent Labs    10/16/21 0352  DDIMER 4.76*    Hemoglobin A1C: No results for input(s): "HGBA1C" in the last 72 hours. Fasting Lipid Panel: No results for input(s): "CHOL", "HDL", "LDLCALC", "TRIG", "CHOLHDL", "LDLDIRECT" in the last 72 hours. Thyroid Function Tests: No results for input(s): "TSH", "T4TOTAL", "T3FREE", "THYROIDAB" in the last 72 hours.  Invalid  input(s): "FREET3" Anemia Panel: No results for input(s): "VITAMINB12", "FOLATE", "FERRITIN", "TIBC", "IRON", "RETICCTPCT" in the last 72 hours.  No results found.   Echo EF 30-35%, severe biatrial enlargement, moderate to severe mitral and tricuspid regurgitation  TELEMETRY: SR frequent PACs and short runs of NSVT 10-11 beats.  Brief periods of sinus tachycardia with paroxysms of rate up to the 120s-130s before returning to the 90s-100s.   EKG: 9/18, SR LBBB frequent PACs, 9/20 sinus tach with LBBB nonspecific ST changes  Data reviewed by me (LT) 9/19: Palliative care note, nephrology note, CBC, BMP, I's and O's, telemetry, vitals  ASSESSMENT AND PLAN:  Principal Problem:   Acute on chronic systolic CHF (congestive heart failure) (HCC) Active Problems:   Acute renal failure superimposed on stage 3a chronic kidney disease (HCC)   Hypertension   Depression   Thrombocytopenia (HCC)   Protein-calorie malnutrition, severe    1. Acute on chronic systolic congestive heart failure, HFrEF EF 25-30% 08/02/2021, slight clinical improvement after initial diuresis, with resultant worsening renal function, seen by nephrology, started on furosemide infusion, worsening respiratory status overnight on 9/17 resulting in transfer to stepdown. 2.  Acute renal failure with underlying CKD stage III, followed by nephrology, slowly improving with Lasix infusion and metolazone 3.  Elevated troponin (30, 32, 28), most consistent with myocardial injury due to chronic kidney disease without ischemia 4.  Chronic LBBB 5.  Moderate to severe mitral and tricuspid regurgitation 6.  Moderate right pleural effusion   Recommendations   1.  Agree current therapy 2.  Continue furosemide infusion 10 mg/h, will dose another metolazone 5 mg x 2 on 9/18 and 9/19.  We will hold further metolazone today and consider redosing this tomorrow 9/21. 3.  Restart carvedilol 3.125 mg twice daily 4.  Carefully monitor renal  status, nephrology following 5.  Consider adding empagliflozin if renal status stabilizes which can be done as outpatient 6.  Outpatient evaluation for CRT-D per Dr. Clayborn Bigness 7.   Appreciate palliative involvement to discuss goals in the setting of her multiple comorbidities, her daughter expressed "wanting her mother to live forever."     This patient's plan of care was discussed and created with Dr. Nehemiah Massed and he is in agreement.    Tristan Schroeder, PA-C 10/18/2021 9:31 AM  The patient has had some more struggling her feeling quite a well.  She continues to have worsening kidney dysfunction hypotension and shortness of breath.  Patient is very weak at this time.  Her heart rate has been changing considerably with some periods of sinus tachycardia and other periods of supraventricular tachycardia with premature atrial contractions but no evidence of ventricular tachycardia.  We have reinstated carvedilol although this has not helped a great deal.  There is concerns that the patient will have difficulty with recovery  The  patient has been interviewed and examined. I agree with assessment and plan above. Serafina Royals MD [facility]

## 2021-10-18 NOTE — Progress Notes (Signed)
Dr. Arbutus Ped notified of tachypnea with RR 30-35.  Patient denies any chest pain and shortness of breath.  O2 SATs 96% on 2L nasal cannula.  Per MD orders to be placed.

## 2021-10-18 NOTE — Progress Notes (Signed)
Patient continues to have nonsustained runs of SVT and increased ectopy.  Zoe Doe, NP and Dr. Arbutus Ped made aware.  Order obtained to hold lasix drip until repeat potassium at 18:00 today and administer IV potassium per order. Serum magnesium to be checked again.

## 2021-10-18 NOTE — Progress Notes (Addendum)
Progress Note   Patient: Zoe Boyle EXB:[id number] DOB: [birth year] DOA: 10/11/2021     7 DOS: the patient was seen and examined on 10/18/2021   Brief hospital course: Mrs. Un was admitted to the hospital with the working diagnosis of decompensated heart failure.   81 yo female with the past medical history of congested heart failure, hypertension, dyslipidemia and CKD stage 3a who presented with dyspnea and lower extremity edema. Reported 3 to 4 days of dyspnea, lower extremity edema despite taking diuretic therapy at home. Recent hospitalization 06/11 to 07/13/21 for group A strep bacteremia and 07/23 for heart failure. On her initial physical examination her blood pressure was 99/77, rr 24 to 32, HR 72 and 02 saturation 96% on room air, she had increased work of breathing, heart with S1 and S2 present and rhythmic, abdomen not distended and positive lower extremity edema.   NA 137, K 3,4 CL 106, bicarbonate 18, glucose 160 un 53 cr 1,72  High sensitive troponin 46 and 40  Wbc 4,7 hgb 121 plt 86   Chest radiograph with significant cardiomegaly, bilateral hilar vascular congestion and bilateral small pleural effusions.   EKG 79 bpm, right axis, qtc 525, left bundle branch block, sinus rhythm with first degree AV block, J point elevation V1 to V3, no significant T wave changes.  Patient was placed on furosemide for diuresis. Echocardiogram with reduced LV systolic function.      Assessment and Plan: * Acute on chronic systolic CHF (congestive heart failure) (HCC) Echocardiogram with LV systolic function 30 to 10%, mild dilatated LV cavity, RV systolic function preserved, severe dilatation LA and RA, severe TR.  --Volume status and renal function improving with Lasix drip --Nephrology and cardiology are following.  -- Hold RAS inhibition with AKI -- Hold on B blockade with  decompensated heart failure and now soft BP's.   Acute hypoxemic respiratory failure due to cardiogenic  pulmonary edema.  --supplement O2 PRN to keep sats > 90%, wean as tolerated  Acute renal failure superimposed on stage 3a chronic kidney disease (HCC) Metabolic Acidosis - due to renal failure. Hyperkalemia - resolved. --renal function improving on Lasix drip --Nephrology consulted --monitor BMP, urine output  Hypertension Hold antihypertensive medications to prevent hypotension.   Protein-calorie malnutrition, severe Continue with nutritional supplements.   Thrombocytopenia (HCC) Leukopenia - resolved.  Platelet count stable 128>>118>>123k -- Hold on aspirin.   Bloody bowel reported earlier this admission, has history of hemorrhoids -- Hold aspirin   Depression Continue mirtazapine  SVT (supraventricular tachycardia) ([place]) 9/20 - over course of the day, pt has had increasingly frequent runs of non-sustained SVT, soft BP's, tachypnea, but otherwise asymptomatic --D/C Coreg for now to prevent hypotension --Troponin, D-dimer and CXR pending --No overt A-fib on tele or EKG thus far --Cardiology following and has been updated throughout the day --Correcting electrolytes  Elevated troponin Troponin of 40 likely secondary to demand ischemia Continue to trend Continue and Pravastatin    Hypokalemia Secondary to aggressive diuresis. K replacement is ongoing. Last K 2.8, down from AM 3.0 despite replacement. --Further IV KCl replacement --Goal K>4, Mg>2 given runs of SVT --Mg level pending, but AM was 2.1        Subjective: Pt was awake about to eat breakfast when seen on rounds.  She reported feeling fatigued but otherwise doing okay.  When asked how is her appetite, she said "it's there, I'll try".  No other acute complaints.  She denies CP, SOB, N/V, dizziness or lightheadedness, F/C,  or palpitations.    Physical Exam: Vitals:   10/18/21 1700 10/18/21 1800 10/18/21 1815 10/18/21 1830  BP:  (!) 108/90    Pulse: 85  86 87  Resp: (!) 24 (!) (P) 30 (!) 35 (!) 35   Temp:      TempSrc:      SpO2: 97% 97% 96% 95%  Weight:      Height:       General exam: awake and alert, no acute distress HEENT: moist mucus membranes, hearing grossly normal  Respiratory system: CTAB with diminished bases, no wheezes or rhonchi, normal respiratory effort. Cardiovascular system: normal S1/S2, RRR   Gastrointestinal system: soft, NT, ND Central nervous system: A&O x3. no gross focal neurologic deficits, normal speech Extremities: resolved lower extremity edema, normal tone Skin: dry, intact, normal temperature Psychiatry: normal mood, congruent affect, judgement and insight appear normal d  Data Reviewed: Notable labs ---   K 3.0 >> 2.8, glucose 125, BUN 82, Cr 2.17 improving, GFR 23, Lactate 1.6 from 2.9, Hbg 11.5, platelets 123k  Family Communication: daughter updated by phone this afternoon.    Disposition: Status is: Inpatient Remains inpatient appropriate because: remains on Lasix drip, new onset runs of SVT   Planned Discharge Destination: Home     Critical care Time spent: 55 minutes.  Patient critically ill with decompensated heart failure, remains on Lasix drip  Patient is high risk for morbidity and mortality    Author: Ezekiel Slocumb, DO 10/18/2021 7:23 PM  For on call review [email or web address].

## 2021-10-18 NOTE — Progress Notes (Signed)
Potassium 2.8.  Orinda Kenner, NP notified. Order received for PO potassium.

## 2021-10-18 NOTE — Progress Notes (Addendum)
[facility]  ROUNDING NOTE   Subjective:   Patient seen and evaluated at bedside in ICU Patient currently sitting up, attempting to eat breakfast No family at bedside Continues to complain of generalized fatigue  Creatinine 2.17 Urine output 3.7L in preceding 24 hours. Furosemide drip at 10 mg/h  Objective:  Vital signs in last 24 hours:  Temp:  [97.5 F (36.4 C)-98.7 F (37.1 C)] 97.5 F (36.4 C) (09/20 0740) Pulse Rate:  [41-98] 98 (09/20 1000) Resp:  [16-48] 24 (09/20 1000) BP: (98-127)/(68-95) 127/73 (09/20 1000) SpO2:  [93 %-100 %] 93 % (09/20 1000) Weight:  [49.5 kg] 49.5 kg (09/20 0456)  Weight change: 0.6 kg Filed Weights   10/15/21 0432 10/17/21 0500 10/18/21 0456  Weight: 54.4 kg 48.9 kg 49.5 kg    Intake/Output: I/O last 3 completed shifts: In: 651.4 [P.O.:470; I.V.:181.4] Out: 4470 [Urine:4470]   Intake/Output this shift:  Total I/O In: 135 [P.O.:120; I.V.:15] Out: 650 [Urine:650]  Physical Exam: General: NAD  Head: Normocephalic, atraumatic.  Moist oral mucosal membranes  Eyes: Anicteric  Lungs:  Basilar crackles, normal effort  Heart: Irregular  Abdomen:  Soft, nontender  Extremities:  trace peripheral edema.  Neurologic: Nonfocal, moving all four extremities  Skin: No lesions  Access: None    Basic Metabolic Panel: Recent Labs  Lab 10/13/21 0604 10/14/21 0749 10/16/21 0352 10/16/21 0717 10/16/21 1354 10/17/21 0547 10/18/21 0417  NA 138   < > 137 142 139 140 137  K 4.9   < > 5.7* 5.2* 5.8* 4.0 3.0*  CL 109   < > 106 106 106 103 97*  CO2 18*   < > 9* 14* 19* 24 26  GLUCOSE 118*   < > 135* 90 150* 110* 125*  BUN 57*   < > 74* 80* 76* 79* 82*  CREATININE 1.84*   < > 2.70* 2.62* 2.53* 2.43* 2.17*  CALCIUM 9.1   < > 9.5 9.5 8.9 9.3 9.3  MG 2.2  --  2.9*  --   --   --  2.1   < > = values in this interval not displayed.     Liver Function Tests: No results for input(s): "AST", "ALT", "ALKPHOS", "BILITOT", "PROT",  "ALBUMIN" in the last 168 hours. No results for input(s): "LIPASE", "AMYLASE" in the last 168 hours. No results for input(s): "AMMONIA" in the last 168 hours.  CBC: Recent Labs  Lab 10/15/21 0517 10/16/21 0352 10/16/21 1354 10/17/21 0547 10/18/21 0417  WBC 4.3 3.8* 8.0 6.0 5.3  NEUTROABS  --   --  5.3 3.3  --   HGB 12.2 13.0 11.2* 11.3* 11.5*  HCT 36.6 42.6 34.9* 34.1* 34.5*  MCV 81.5 85.0 82.7 79.3* 78.1*  PLT 140* 120* 128* 118* 123*     Cardiac Enzymes: No results for input(s): "CKTOTAL", "CKMB", "CKMBINDEX", "TROPONINI" in the last 168 hours.  BNP: Invalid input(s): "POCBNP"  CBG: Recent Labs  Lab 10/16/21 0249 10/16/21 0301 10/16/21 0359  GLUCAP 39* 282* 117*     Microbiology: Results for orders placed or performed during the hospital encounter of 10/11/21  SARS Coronavirus 2 by RT PCR (hospital order, performed in [facility] lab) *cepheid single result test* Anterior Nasal Swab     Status: None   Collection Time: 10/16/21  4:18 AM   Specimen: Anterior Nasal Swab  Result Value Ref Range Status   SARS Coronavirus 2 by RT PCR NEGATIVE NEGATIVE Final    Comment: (NOTE) SARS-CoV-2 target nucleic acids  are NOT DETECTED.  The SARS-CoV-2 RNA is generally detectable in upper and lower respiratory specimens during the acute phase of infection. The lowest concentration of SARS-CoV-2 viral copies this assay can detect is 250 copies / mL. A negative result does not preclude SARS-CoV-2 infection and should not be used as the sole basis for treatment or other patient management decisions.  A negative result may occur with improper specimen collection / handling, submission of specimen other than nasopharyngeal swab, presence of viral mutation(s) within the areas targeted by this assay, and inadequate number of viral copies (<250 copies / mL). A negative result must be combined with clinical observations, patient history, and epidemiological  information.  Fact Sheet for Patients:   https://www.patel.info/  Fact Sheet for Healthcare Providers: https://hall.com/  This test is not yet approved or  cleared by the [place] FDA and has been authorized for detection and/or diagnosis of SARS-CoV-2 by FDA under an Emergency Use Authorization (EUA).  This EUA will remain in effect (meaning this test can be used) for the duration of the COVID-19 declaration under Section 564(b)(1) of the Act, 21 U.S.C. section 360bbb-3(b)(1), unless the authorization is terminated or revoked sooner.  Performed at [facility], [address] [place], [place] [address]     Coagulation Studies: No results for input(s): "LABPROT", "INR" in the last 72 hours.  Urinalysis: No results for input(s): "COLORURINE", "LABSPEC", "PHURINE", "GLUCOSEU", "HGBUR", "BILIRUBINUR", "KETONESUR", "PROTEINUR", "UROBILINOGEN", "NITRITE", "LEUKOCYTESUR" in the last 72 hours.  Invalid input(s): "APPERANCEUR"    Imaging: DG Knee 1-2 Views Left  Result Date: 10/18/2021 CLINICAL DATA:  Left knee pain and none. EXAM: LEFT KNEE - 1-2 VIEW COMPARISON:  None Available. FINDINGS: There is diffuse decreased bone mineralization. Likely moderate knee joint effusion. Minimal chronic enthesopathic change at the quadriceps insertion on the patella. Mild-to-moderate patellofemoral peripheral degenerative osteophytes. Moderate compartment joint space narrowing. Moderate peripheral medial compartment degenerative osteophytosis. No acute fracture or dislocation. 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 Moderate knee joint effusion. Electronically Signed   By: Yvonne Kendall M.D.   On: 10/18/2021 10:20     Medications:    sodium chloride Stopped (10/16/21 1403)   furosemide (LASIX) 200 mg in dextrose 5 % 100 mL (2 mg/mL) infusion 10 mg/hr (10/18/21 1000)   promethazine  (PHENERGAN) injection (IM or IVPB)      acidophilus  1 capsule Oral Daily   carvedilol  3.125 mg Oral BID   Chlorhexidine Gluconate Cloth  6 each Topical Daily   feeding supplement  237 mL Oral TID BM   heparin injection (subcutaneous)  5,000 Units Subcutaneous Q8H   megestrol  320 mg Oral Daily   mirtazapine  15 mg Oral QHS   multivitamin with minerals  1 tablet Oral Daily   pravastatin  20 mg Oral Daily   sodium chloride, acetaminophen **OR** acetaminophen, dextrose, ondansetron **OR** ondansetron (ZOFRAN) IV, mouth rinse, promethazine (PHENERGAN) injection (IM or IVPB)  Assessment/ Plan:  Zoe Boyle is a 81 y.o.  female with medical problems of chronic systolic CHF with EF of 25 to 30% from June 2023, hypertension, hyperlipidemia, chronic kidney disease, anemia, depression, hospitalization from strep bacteremia in July 2023,   was admitted on 10/11/2021 for CHF (congestive heart failure) ([place]) [I50.9] Acute on chronic congestive heart failure, unspecified heart failure type ([place]) [I50.9]   Acute kidney injury in the setting of multiple valvular abnormalities, severe systolic CHF and chronic kidney disease stage IIIb with baseline creatinine 1.45 from August 04, 2021/GFR 37, lower extremity edema with volume overload.   Creatinine continues to improve with adequate urine output recorded.  We will continue current treatments as prescribed.  No acute need for dialysis at this time. Lab Results  Component Value Date   CREATININE 2.17 (H) 10/18/2021   CREATININE 2.43 (H) 10/17/2021   CREATININE 2.53 (H) 10/16/2021    Intake/Output Summary (Last 24 hours) at 10/18/2021 1043 Last data filed at 10/18/2021 1000 Gross per 24 hour  Intake 470.02 ml  Output 3965 ml  Net -3494.98 ml    2.  Acute on chronic systolic heart failure, unspecified heart failure type ([place]) [I50.9] 2D echo July 2023: LVEF 25 to 30%, global hypokinesis, moderately dilated left ventricular internal cavity, severely  dilated left atrium, small pericardial effusion, severe mitral regurgitation, severe tricuspid regurgitation, moderately elevated pulmonary artery systolic pressure.  Cardiology is following and recommend CRT-D outpatient with Dr. Clayborn Bigness.  Continue furosemide drip, cardiology may redose metolazone tomorrow.  3.  Hypertension with chronic kidney disease stage IIIb.  Home regimen includes carvedilol, spironolactone, and torsemide.  All currently held.    Blood pressure currently 119/81.  Cardiology will restart carvedilol today.  4. Anemia of chronic kidney disease Normocytic Lab Results  Component Value Date   HGB 11.5 (L) 10/18/2021    Hemoglobin remains within acceptable target.   LOS: 7 Zoe Boyle 9/20/202310:43 AM

## 2021-10-18 NOTE — Progress Notes (Signed)
Daily Progress Note   Patient Name: Zoe Boyle       Date: 10/18/2021 DOB: [birth year]  Age: 81 y.o. MRN#: [id number] Attending Physician: Ezekiel Slocumb, DO Primary Care Physician: Gladstone Lighter, MD Admit Date: 10/11/2021  Reason for Consultation/Follow-up: Establishing goals of care  Subjective: Notes and labs reviewed. Nursing discusses her SVT and poor PO intake. No family at bedside. Daughter has been present for previous [place] conversation. Will need daughter in further conversation.   Length of Stay: 7  Current Medications: Scheduled Meds:   acidophilus  1 capsule Oral Daily   carvedilol  6.25 mg Oral BID   Chlorhexidine Gluconate Cloth  6 each Topical Daily   feeding supplement (NEPRO CARB STEADY)  237 mL Oral TID BM   heparin injection (subcutaneous)  5,000 Units Subcutaneous Q8H   megestrol  320 mg Oral Daily   mirtazapine  15 mg Oral QHS   multivitamin with minerals  1 tablet Oral Daily   potassium chloride  40 mEq Oral BID   pravastatin  20 mg Oral Daily    Continuous Infusions:  sodium chloride Stopped (10/16/21 1403)   furosemide (LASIX) 200 mg in dextrose 5 % 100 mL (2 mg/mL) infusion     potassium chloride     promethazine (PHENERGAN) injection (IM or IVPB)      PRN Meds: sodium chloride, acetaminophen **OR** acetaminophen, dextrose, ondansetron **OR** ondansetron (ZOFRAN) IV, mouth rinse, promethazine (PHENERGAN) injection (IM or IVPB)  Physical Exam Pulmonary:     Effort: Pulmonary effort is normal.  Neurological:     Mental Status: She is alert.             Vital Signs: BP (!) 108/94   Pulse (!) 51   Temp 97.9 F (36.6 C) (Axillary)   Resp (!) 27   Ht 5' (1.524 m)   Wt 49.5 kg   SpO2 94%   BMI 21.31 kg/m  SpO2: SpO2: 94 % O2 Device: O2  Device: Nasal Cannula O2 Flow Rate: O2 Flow Rate (L/min): 2 L/min  Intake/output summary:  Intake/Output Summary (Last 24 hours) at 10/18/2021 1607 Last data filed at 10/18/2021 1600 Gross per 24 hour  Intake 330.01 ml  Output 4565 ml  Net -4234.99 ml   LBM: Last BM Date : 10/17/21 Baseline Weight: Weight: 50.4 kg Most  recent weight: Weight: 49.5 kg     Patient Active Problem List   Diagnosis Date Noted   Protein-calorie malnutrition, severe 10/15/2021   Thrombocytopenia ([place]) 10/11/2021   Elevated troponin 10/11/2021   Myocardial injury 08/01/2021   HLD (hyperlipidemia) 08/01/2021   Acute on chronic systolic CHF (congestive heart failure) ([place]) 08/01/2021   Depression 08/01/2021   Hypokalemia 07/12/2021   Sepsis due to group A Streptococcus with acute renal failure without septic shock ([place]) 07/12/2021   Edema of face 07/11/2021   Anemia of chronic disease 07/09/2021   Hypertension    Generalized weakness    Protein-calorie malnutrition, moderate (HCC) [id number]   Chronic systolic CHF (congestive heart failure) ([place]) 12/31/2019   Acute renal failure superimposed on stage 3a chronic kidney disease ([place]) 12/31/2019    Palliative Care Assessment & Plan    Recommendations/Plan: Will reach out to daughter to determine a meeting time with both she and th patient.     Code Status:    Code Status Orders  (From admission, onward)           Start     Ordered   10/12/21 0249  Full code  Continuous        10/12/21 0249           Code Status History     Date Active Date Inactive Code Status Order ID Comments User Context   08/01/2021 0724 08/04/2021 2211 Full Code [id number]  Ivor Costa, MD ED   07/10/2021 0306 07/13/2021 2253 Full Code [id number]  Athena Masse, MD Inpatient   12/31/2019 2127 01/02/2020 1842 Full Code [id number]  Collier Bullock, MD ED      Care plan was discussed with RN  Thank you for allowing the Palliative Medicine Team to assist in the care  of this patient.   Asencion Gowda, NP  Please contact Palliative Medicine Team phone at [phone] for questions and concerns.

## 2021-10-18 NOTE — Progress Notes (Signed)
Dr. Nehemiah Massed notified of multiple episodes of nonsustained tachycardia.  BP 99/76.  Patient denies chest pain or shortness of breath.  Dr. Nehemiah Massed made aware of hypokalemia and efforts to replace potassium and was also made of aware of coreg being started and dosage increase.  No further order obtained.  Per Dr. Nehemiah Massed continue to monitor patient.

## 2021-10-18 NOTE — Assessment & Plan Note (Addendum)
9/20 - over course of the day, pt has had increasingly frequent runs of non-sustained SVT, soft BP's, tachypnea, but otherwise asymptomatic --D/C Coreg to prevent hypotension --Cardiology following and has been updated throughout the day --Electrolytes monitored and corrected

## 2021-10-18 NOTE — Assessment & Plan Note (Addendum)
Secondary to aggressive diuresis. K replacement is ongoing. No furhter labs or replacement needed per comofrt care

## 2021-10-19 ENCOUNTER — Encounter: Payer: Self-pay | Admitting: Internal Medicine

## 2021-10-19 ENCOUNTER — Inpatient Hospital Stay: Payer: Medicare Other

## 2021-10-19 DIAGNOSIS — Z7189 Other specified counseling: Secondary | ICD-10-CM | POA: Diagnosis not present

## 2021-10-19 DIAGNOSIS — M25562 Pain in left knee: Secondary | ICD-10-CM | POA: Diagnosis not present

## 2021-10-19 DIAGNOSIS — I5023 Acute on chronic systolic (congestive) heart failure: Secondary | ICD-10-CM | POA: Diagnosis not present

## 2021-10-19 DIAGNOSIS — M25462 Effusion, left knee: Secondary | ICD-10-CM | POA: Diagnosis not present

## 2021-10-19 LAB — CBC
HCT: 36.9 % (ref 36.0–46.0)
Hemoglobin: 12.3 g/dL (ref 12.0–15.0)
MCH: 26.1 pg (ref 26.0–34.0)
MCHC: 33.3 g/dL (ref 30.0–36.0)
MCV: 78.3 fL — ABNORMAL LOW (ref 80.0–100.0)
Platelets: 99 10*3/uL — ABNORMAL LOW (ref 150–400)
RBC: 4.71 MIL/uL (ref 3.87–5.11)
RDW: 16.5 % — ABNORMAL HIGH (ref 11.5–15.5)
WBC: 15.9 10*3/uL — ABNORMAL HIGH (ref 4.0–10.5)
nRBC: 0.4 % — ABNORMAL HIGH (ref 0.0–0.2)

## 2021-10-19 LAB — BASIC METABOLIC PANEL
Anion gap: 13 (ref 5–15)
BUN: 87 mg/dL — ABNORMAL HIGH (ref 8–23)
CO2: 24 mmol/L (ref 22–32)
Calcium: 9.4 mg/dL (ref 8.9–10.3)
Chloride: 99 mmol/L (ref 98–111)
Creatinine, Ser: 2.32 mg/dL — ABNORMAL HIGH (ref 0.44–1.00)
GFR, Estimated: 21 mL/min — ABNORMAL LOW (ref >60)
Glucose, Bld: 118 mg/dL — ABNORMAL HIGH (ref 70–99)
Potassium: 5.4 mmol/L — ABNORMAL HIGH (ref 3.5–5.1)
Sodium: 136 mmol/L (ref 135–145)

## 2021-10-19 LAB — POTASSIUM: Potassium: 4.8 mmol/L (ref 3.5–5.1)

## 2021-10-19 LAB — MAGNESIUM: Magnesium: 2.1 mg/dL (ref 1.7–2.4)

## 2021-10-19 LAB — C-REACTIVE PROTEIN: CRP: 23.9 mg/dL — ABNORMAL HIGH (ref <1.0)

## 2021-10-19 LAB — SEDIMENTATION RATE: Sed Rate: 29 mm/hr (ref 0–30)

## 2021-10-19 MED ORDER — METOLAZONE 5 MG PO TABS
5.0000 mg | ORAL_TABLET | Freq: Once | ORAL | Status: AC
  Administered 2021-10-19: 5 mg via ORAL
  Filled 2021-10-19: qty 1

## 2021-10-19 MED ORDER — TECHNETIUM TO 99M ALBUMIN AGGREGATED
4.0000 | Freq: Once | INTRAVENOUS | Status: AC
  Administered 2021-10-19: 4.26 via INTRAVENOUS

## 2021-10-19 MED ORDER — PREDNISONE 20 MG PO TABS
40.0000 mg | ORAL_TABLET | Freq: Every day | ORAL | Status: DC
  Administered 2021-10-19 – 2021-10-20 (×2): 40 mg via ORAL
  Filled 2021-10-19 (×2): qty 2

## 2021-10-19 NOTE — Assessment & Plan Note (Addendum)
Gout Flare - no prior known diagnosis of gout. Pt developed severely tender left knee pain and swelling.  Xray showed moderate joint effusion.  Uric acid and CRP elevated. --Started on Prednisone, taper sent for discahrge --Lidocaine patch or cream to left knee --Can use topical Voltaren gel if lidocaine not helpful --Anti-inflammatories contraindicated with renal failure

## 2021-10-19 NOTE — Progress Notes (Signed)
   10/19/21 1500  Clinical Encounter Type  Visited With Patient and family together  Visit Type Initial  Referral From Nurse  Consult/Referral To Chaplain   Chaplain assisted patient to complete AD.

## 2021-10-19 NOTE — Progress Notes (Signed)
[facility]  ROUNDING NOTE   Subjective:   Patient seen and evaluated at bedside in ICU Patient resting quietly, no family at bedside Remains on 2L [place] lower extremity edema  Creatinine 2.32 Urine output 1.9L in preceding 24 hours. Furosemide drip held briefly yesterday  Objective:  Vital signs in last 24 hours:  Temp:  [97.9 F (36.6 C)-98.7 F (37.1 C)] 98.7 F (37.1 C) (09/21 0400) Pulse Rate:  [44-99] 85 (09/21 0800) Resp:  [17-55] 42 (09/21 0900) BP: (86-113)/(43-94) 93/81 (09/21 0800) SpO2:  [91 %-100 %] 93 % (09/21 0800) Weight:  [48.8 kg] 48.8 kg (09/21 0400)  Weight change: -0.7 kg Filed Weights   10/17/21 0500 10/18/21 0456 10/19/21 0400  Weight: 48.9 kg 49.5 kg 48.8 kg    Intake/Output: I/O last 3 completed shifts: In: 551.1 [P.O.:200; I.V.:160; Other:10; IV Piggyback:181.1] Out: 5100 [Urine:5100]   Intake/Output this shift:  Total I/O In: 14.6 [I.V.:14.6] Out: -   Physical Exam: General: NAD  Head: Normocephalic, atraumatic.  Moist oral mucosal membranes  Eyes: Anicteric  Lungs:  Diminished in bases, normal effort  Heart: Irregular  Abdomen:  Soft, nontender  Extremities:  trace peripheral edema.  Neurologic: Nonfocal, moving all four extremities  Skin: No lesions  Access: None    Basic Metabolic Panel: Recent Labs  Lab 10/13/21 0604 10/14/21 0749 10/16/21 0352 10/16/21 0717 10/16/21 1354 10/17/21 0547 10/18/21 0417 10/18/21 1253 10/18/21 2011 10/18/21 2012 10/19/21 0314  NA 138   < > 137 142 139 140 137  --   --   --  136  K 4.9   < > 5.7* 5.2* 5.8* 4.0 3.0* 2.8*  --  4.9 5.4*  CL 109   < > 106 106 106 103 97*  --   --   --  99  CO2 18*   < > 9* 14* 19* 24 26  --   --   --  24  GLUCOSE 118*   < > 135* 90 150* 110* 125*  --   --   --  118*  BUN 57*   < > 74* 80* 76* 79* 82*  --   --   --  87*  CREATININE 1.84*   < > 2.70* 2.62* 2.53* 2.43* 2.17*  --   --   --  2.32*  CALCIUM 9.1   < > 9.5 9.5 8.9 9.3 9.3  --    --   --  9.4  MG 2.2  --  2.9*  --   --   --  2.1  --  2.2  --  2.1   < > = values in this interval not displayed.     Liver Function Tests: No results for input(s): "AST", "ALT", "ALKPHOS", "BILITOT", "PROT", "ALBUMIN" in the last 168 hours. No results for input(s): "LIPASE", "AMYLASE" in the last 168 hours. No results for input(s): "AMMONIA" in the last 168 hours.  CBC: Recent Labs  Lab 10/16/21 0352 10/16/21 1354 10/17/21 0547 10/18/21 0417 10/19/21 0314  WBC 3.8* 8.0 6.0 5.3 15.9*  NEUTROABS  --  5.3 3.3  --   --   HGB 13.0 11.2* 11.3* 11.5* 12.3  HCT 42.6 34.9* 34.1* 34.5* 36.9  MCV 85.0 82.7 79.3* 78.1* 78.3*  PLT 120* 128* 118* 123* 99*     Cardiac Enzymes: No results for input(s): "CKTOTAL", "CKMB", "CKMBINDEX", "TROPONINI" in the last 168 hours.  BNP: Invalid input(s): "POCBNP"  CBG: Recent Labs  Lab 10/16/21 0249 10/16/21 0301  10/16/21 0359  GLUCAP 39* 282* 117*     Microbiology: Results for orders placed or performed during the hospital encounter of 10/11/21  SARS Coronavirus 2 by RT PCR (hospital order, performed in [facility] hospital lab) *cepheid single result test* Anterior Nasal Swab     Status: None   Collection Time: 10/16/21  4:18 AM   Specimen: Anterior Nasal Swab  Result Value Ref Range Status   SARS Coronavirus 2 by RT PCR NEGATIVE NEGATIVE Final    Comment: (NOTE) SARS-CoV-2 target nucleic acids are NOT DETECTED.  The SARS-CoV-2 RNA is generally detectable in upper and lower respiratory specimens during the acute phase of infection. The lowest concentration of SARS-CoV-2 viral copies this assay can detect is 250 copies / mL. A negative result does not preclude SARS-CoV-2 infection and should not be used as the sole basis for treatment or other patient management decisions.  A negative result may occur with improper specimen collection / handling, submission of specimen other than nasopharyngeal swab, presence of viral mutation(s)  within the areas targeted by this assay, and inadequate number of viral copies (<250 copies / mL). A negative result must be combined with clinical observations, patient history, and epidemiological information.  Fact Sheet for Patients:   RoadLapTop.co.zahttps://www.fda.gov/media/158405/download  Fact Sheet for Healthcare Providers: http://kim-miller.com/https://www.fda.gov/media/158404/download  This test is not yet approved or  cleared by the [place] FDA and has been authorized for detection and/or diagnosis of SARS-CoV-2 by FDA under an Emergency Use Authorization (EUA).  This EUA will remain in effect (meaning this test can be used) for the duration of the COVID-19 declaration under Section 564(b)(1) of the Act, 21 U.S.C. section 360bbb-3(b)(1), unless the authorization is terminated or revoked sooner.  Performed at [facility], [address] [place], [place] [address]     Coagulation Studies: No results for input(s): "LABPROT", "INR" in the last 72 hours.  Urinalysis: No results for input(s): "COLORURINE", "LABSPEC", "PHURINE", "GLUCOSEU", "HGBUR", "BILIRUBINUR", "KETONESUR", "PROTEINUR", "UROBILINOGEN", "NITRITE", "LEUKOCYTESUR" in the last 72 hours.  Invalid input(s): "APPERANCEUR"    Imaging: DG Chest Port 1 View  Result Date: 10/18/2021 CLINICAL DATA:  Acute on chronic congestive heart failure EXAM: PORTABLE CHEST 1 VIEW COMPARISON:  10/16/2021 FINDINGS: Single frontal view of the chest demonstrates stable marked enlargement of the cardiac silhouette. There is continued central vascular congestion, with bibasilar veiling opacities consistent with edema and effusion. No pneumothorax. IMPRESSION: 1. Congestive heart failure, without significant change since prior study. Electronically Signed   By: Sharlet SalinaMichael  Brown M.D.   On: 10/18/2021 19:05   DG Knee 1-2 Views Left  Result Date: 10/18/2021 CLINICAL DATA:  Left knee pain and none. EXAM: LEFT KNEE - 1-2 VIEW COMPARISON:  None Available.  FINDINGS: There is diffuse decreased bone mineralization. Likely moderate knee joint effusion. Minimal chronic enthesopathic change at the quadriceps insertion on the patella. Mild-to-moderate patellofemoral peripheral degenerative osteophytes. Moderate compartment joint space narrowing. Moderate peripheral medial compartment degenerative osteophytosis. No acute fracture or dislocation. 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 Moderate knee joint effusion. Electronically Signed   By: Neita Garnetonald  Viola M.D.   On: 10/18/2021 10:20     Medications:    sodium chloride Stopped (10/16/21 1403)   furosemide (LASIX) 200 mg in dextrose 5 % 100 mL (2 mg/mL) infusion 10 mg/hr (10/19/21 0755)   promethazine (PHENERGAN) injection (IM or IVPB)      acidophilus  1 capsule Oral Daily   Chlorhexidine Gluconate Cloth  6 each Topical Daily  feeding supplement (NEPRO CARB STEADY)  237 mL Oral TID BM   heparin injection (subcutaneous)  5,000 Units Subcutaneous Q8H   megestrol  320 mg Oral Daily   mirtazapine  15 mg Oral QHS   multivitamin with minerals  1 tablet Oral Daily   pravastatin  20 mg Oral Daily   predniSONE  40 mg Oral Q breakfast   sodium chloride, acetaminophen **OR** acetaminophen, dextrose, ondansetron **OR** ondansetron (ZOFRAN) IV, mouth rinse, promethazine (PHENERGAN) injection (IM or IVPB)  Assessment/ Plan:  Ms. Zoe Boyle is a 81 y.o.  female with medical problems of chronic systolic CHF with EF of 25 to 30% from June 2023, hypertension, hyperlipidemia, chronic kidney disease, anemia, depression, hospitalization from strep bacteremia in July 2023,   was admitted on 10/11/2021 for CHF (congestive heart failure) ([place]) [I50.9] Acute on chronic congestive heart failure, unspecified heart failure type ([place]) [I50.9]   Acute kidney injury in the setting of multiple valvular abnormalities, severe systolic CHF and chronic kidney disease stage IIIb with  baseline creatinine 1.45 from August 04, 2021/GFR 37, lower extremity edema with volume overload.   Creatinine elevated today with decreased urine output. Decreased UOP likely due to held Furosemide drip yesterday. Fursodemide drip restarted and patient will likely receive a dose of Metolazone today. No acute need for dialysis at this time. Dicussed with patient that dialysis would be difficult with her cardiac history and soft blood pressures. She voices her understanding, but states if dialysis is needed, she would like to try. Lab Results  Component Value Date   CREATININE 2.32 (H) 10/19/2021   CREATININE 2.17 (H) 10/18/2021   CREATININE 2.43 (H) 10/17/2021    Intake/Output Summary (Last 24 hours) at 10/19/2021 1010 Last data filed at 10/19/2021 0755 Gross per 24 hour  Intake 264.99 ml  Output 1335 ml  Net -1070.01 ml    2.  Acute on chronic systolic heart failure, unspecified heart failure type ([place]) [I50.9] 2D echo July 2023: LVEF 25 to 30%, global hypokinesis, moderately dilated left ventricular internal cavity, severely dilated left atrium, small pericardial effusion, severe mitral regurgitation, severe tricuspid regurgitation, moderately elevated pulmonary artery systolic pressure.  Cardiology is following and recommend CRT-D outpatient with Dr. Clayborn Bigness.  Furosemide drip held due to hypokalemia, but was restarted last night. Cardiology considering Metolazone today.   3.  Hypertension with chronic kidney disease stage IIIb.  Home regimen includes carvedilol, spironolactone, and torsemide.   Low dose carvedilol restarted.  Blood pressure remains soft, 93/81    4. Anemia of chronic kidney disease Normocytic Lab Results  Component Value Date   HGB 12.3 10/19/2021    Hemoglobin remains within acceptable target.   LOS: 8 [place] 9/21/202310:10 AM

## 2021-10-19 NOTE — Progress Notes (Signed)
Manufacturing engineer [facility]) Hospital Liaison Note   Received request from Palliative provider/Crystal G.,NP for hospice services at home after discharge. Chart and patient information under review by [facility] physician. Hospice eligibility approved.   Spoke with daughter/Tracito initiate education related to hospice philosophy, services, and team approach to care. She verbalized understanding of information given. Per discussion, the plan is for patient to discharge home via [facility] once cleared to DC & DME arrives.    DME needs discussed. Patient has the following equipment in the home (Purchased privately): N/A Patient requests the following equipment for delivery: Hospital bed O2 @ 2L [facility]  Address verified and is correct in the chart. Tressia Miners is the family member to contact to arrange time of equipment delivery.    Please send signed and completed DNR home with patient/family. Please provide prescriptions at discharge as needed to ensure ongoing symptom management.    [facility] information and contact numbers given to family & above information shared with TOC.   Please call with any questions/concerns.    Thank you for the opportunity to participate in this patient's care.   Daphene Calamity, MSW [facility] Liaison  [phone]

## 2021-10-19 NOTE — Progress Notes (Addendum)
Daily Progress Note   Patient Name: Zoe Boyle       Date: 10/19/2021 DOB: [birth year]  Age: 81 y.o. MRN#: [id number] Attending Physician: Ezekiel Slocumb, DO Primary Care Physician: Gladstone Lighter, MD Admit Date: 10/11/2021  Reason for Consultation/Follow-up: Establishing goals of care  Subjective: Notes and labs reviewed in detail. In to see patient, no family at bedside. Patient is extremely soft spoken and does not say a lot. She lives with her daughter. She has other children. She is clear she wants her daughter to be her surrogate Media planner.    We discussed her diagnoses, prognosis, GOC, EOL wishes disposition and options.  Created space and opportunity for patient  to explore thoughts and feelings regarding current medical information.   A detailed discussion was had today regarding advanced directives.  Concepts specific to code status, artifical feeding and hydration, IV antibiotics and rehospitalization were discussed.  The difference between an aggressive medical intervention path and a comfort care path was discussed.  Values and goals of care important to patient and family were attempted to be elicited.  Discussed limitations of medical interventions to prolong quality of life in some situations and discussed the concept of human mortality.  She has been updated. She understands her status. With much conversation she states she does not want a feeding tube and wants to go home and focus on comfort care.   Met with daughter. Discussed her mother's status in depth. Discussed her decline since last admission. Long discussion regarding thoughts and feelings, and honoring patient wishes. We discussed family dynamics. Daughter is amenable to home with hospice.     I  completed a MOST form today and daughter signed it, and the signed original was placed in the chart. The form was scanned and sent to medical records for it to be uploaded under ACP tab in Epic. A photocopy was also placed in the chart to be scanned into EMR. The patient outlined their wishes for the following treatment decisions:  Cardiopulmonary Resuscitation: Do Not Attempt Resuscitation (DNR/No CPR)  Medical Interventions: Comfort Measures: Keep clean, warm, and dry. Use medication by any route, positioning, wound care, and other measures to relieve pain and suffering. Use oxygen, suction and manual treatment of airway obstruction as needed for comfort. Do not transfer to the hospital unless comfort  needs cannot be met in current location.  Antibiotics: Determine use of limitation of antibiotics when infection occurs  IV Fluids: No IV fluids (provide other measures to ensure comfort)  Feeding Tube: No feeding tube     Returned to bedside to answer daughter's questions about hospice. Son and granddaughter were at bedside.   Length of Stay: 8  Current Medications: Scheduled Meds:   acidophilus  1 capsule Oral Daily   Chlorhexidine Gluconate Cloth  6 each Topical Daily   feeding supplement (NEPRO CARB STEADY)  237 mL Oral TID BM   heparin injection (subcutaneous)  5,000 Units Subcutaneous Q8H   megestrol  320 mg Oral Daily   mirtazapine  15 mg Oral QHS   multivitamin with minerals  1 tablet Oral Daily   pravastatin  20 mg Oral Daily   predniSONE  40 mg Oral Q breakfast    Continuous Infusions:  sodium chloride Stopped (10/16/21 1403)   furosemide (LASIX) 200 mg in dextrose 5 % 100 mL (2 mg/mL) infusion 10 mg/hr (10/19/21 0755)   promethazine (PHENERGAN) injection (IM or IVPB)      PRN Meds: sodium chloride, acetaminophen **OR** acetaminophen, dextrose, ondansetron **OR** ondansetron (ZOFRAN) IV, mouth rinse, promethazine (PHENERGAN) injection (IM or IVPB)  Physical  Exam Pulmonary:     Effort: Pulmonary effort is normal.  Neurological:     Mental Status: She is alert.             Vital Signs: BP 93/81   Pulse 85   Temp 98.7 F (37.1 C) (Oral)   Resp (!) 42   Ht 5' (1.524 m)   Wt 48.8 kg   SpO2 93%   BMI 21.01 kg/m  SpO2: SpO2: 93 % O2 Device: O2 Device: Nasal Cannula O2 Flow Rate: O2 Flow Rate (L/min): 2 L/min  Intake/output summary:  Intake/Output Summary (Last 24 hours) at 10/19/2021 1029 Last data filed at 10/19/2021 0755 Gross per 24 hour  Intake 264.99 ml  Output 1335 ml  Net -1070.01 ml   LBM: Last BM Date : 10/17/21 Baseline Weight: Weight: 50.4 kg Most recent weight: Weight: 48.8 kg      Patient Active Problem List   Diagnosis Date Noted   SVT (supraventricular tachycardia) ([place]) 10/18/2021   Protein-calorie malnutrition, severe 10/15/2021   Thrombocytopenia ([place]) 10/11/2021   Elevated troponin 10/11/2021   Myocardial injury 08/01/2021   HLD (hyperlipidemia) 08/01/2021   Acute on chronic systolic CHF (congestive heart failure) ([place]) 08/01/2021   Depression 08/01/2021   Hypokalemia 07/12/2021   Sepsis due to group A Streptococcus with acute renal failure without septic shock ([place]) 07/12/2021   Edema of face 07/11/2021   Anemia of chronic disease 07/09/2021   Hypertension    Generalized weakness    Protein-calorie malnutrition, moderate (HCC) 99/24/2683   Chronic systolic CHF (congestive heart failure) ([place]) 12/31/2019   Acute renal failure superimposed on stage 3a chronic kidney disease ([place]) 12/31/2019    Palliative Care Assessment & Plan   Recommendations/Plan:  Patient wants to go home with hospice. Daughter is amenable.   Needs HPOA completed before D/C.    Code Status:    Code Status Orders  (From admission, onward)           Start     Ordered   10/12/21 0249  Full code  Continuous        10/12/21 0249           Code Status History     Date Active Date  Inactive Code Status Order ID  Comments User Context   08/01/2021 0724 08/04/2021 2211 Full Code [id number]  Ivor Costa, MD ED   07/10/2021 0306 07/13/2021 2253 Full Code [id number]  Athena Masse, MD Inpatient   12/31/2019 2127 01/02/2020 1842 Full Code [id number]  Collier Bullock, MD ED       Prognosis:  < 6 weeks   Care plan was discussed with RN  Thank you for allowing the Palliative Medicine Team to assist in the care of this patient.    Asencion Gowda, NP  Please contact Palliative Medicine Team phone at [phone] for questions and concerns.

## 2021-10-19 NOTE — Progress Notes (Addendum)
Progress Note   Patient: Zoe Boyle VPX:[id number] DOB: 1940/05/01 DOA: 10/11/2021     8 DOS: the patient was seen and examined on 10/19/2021   Brief hospital course: Zoe Boyle was admitted to the hospital with the working diagnosis of decompensated heart failure.   81 yo female with the past medical history of congested heart failure, hypertension, dyslipidemia and CKD stage 3a who presented with dyspnea and lower extremity edema. Reported 3 to 4 days of dyspnea, lower extremity edema despite taking diuretic therapy at home. Recent hospitalization 06/11 to 07/13/21 for group A strep bacteremia and 07/23 for heart failure. On her initial physical examination her blood pressure was 99/77, rr 24 to 32, HR 72 and 02 saturation 96% on room air, she had increased work of breathing, heart with S1 and S2 present and rhythmic, abdomen not distended and positive lower extremity edema.   NA 137, K 3,4 CL 106, bicarbonate 18, glucose 160 un 53 cr 1,72  High sensitive troponin 46 and 40  Wbc 4,7 hgb 121 plt 86   Chest radiograph with significant cardiomegaly, bilateral hilar vascular congestion and bilateral small pleural effusions.   EKG 79 bpm, right axis, qtc 525, left bundle branch block, sinus rhythm with first degree AV block, J point elevation V1 to V3, no significant T wave changes.  Patient was placed on furosemide for diuresis. Echocardiogram with reduced LV systolic function.      Assessment and Plan: * Acute on chronic systolic CHF (congestive heart failure) (HCC) Echocardiogram with LV systolic function 30 to 46%, mild dilatated LV cavity, RV systolic function preserved, severe dilatation LA and RA, severe TR, severe MR.  --Volume status and renal function improving with Lasix drip --Nephrology and cardiology are following.  -- Hold RAS inhibition with AKI -- Hold on B blockade with  decompensated heart failure and now soft BP's.   Acute hypoxemic respiratory failure due to  cardiogenic pulmonary edema.  --supplement O2 PRN to keep sats > 90%, wean as tolerated  Acute renal failure superimposed on stage 3a chronic kidney disease (HCC) Metabolic Acidosis - due to renal failure. Hyperkalemia - resolved, recurrent 9/21 K 5.4. --renal function improving on Lasix drip --Nephrology consulted --monitor BMP, urine output  Hypertension Hold antihypertensive medications to prevent hypotension.   Protein-calorie malnutrition, severe Continue with nutritional supplements.   Thrombocytopenia (HCC) Leukopenia - resolved.  Platelet count stable 128>>118>>123k -- Hold on aspirin.   Bloody bowel reported earlier this admission, has history of hemorrhoids -- Hold aspirin   Depression Continue mirtazapine  Pain and swelling of left knee Gout Flare - no prior known diagnosis of gout. Pt developed severely tender left knee pain and swelling.  Xray showed moderate joint effusion.  Uric acid and CRP elevated. --Started on Prednisone --Anti-inflammatories contraindicated with renal failure --Monitor closely --Pain control PRN  SVT (supraventricular tachycardia) ([place]) 9/20 - over course of the day, pt has had increasingly frequent runs of non-sustained SVT, soft BP's, tachypnea, but otherwise asymptomatic --D/C Coreg for now to prevent hypotension --Troponin, D-dimer and CXR pending --No overt A-fib on tele or EKG thus far --Cardiology following and has been updated throughout the day --Correcting electrolytes  Elevated troponin Troponin of 40 likely secondary to demand ischemia Continue to trend Continue and Pravastatin    Hypokalemia Secondary to aggressive diuresis. K replacement is ongoing. Last K 2.8, down from AM 3.0 despite replacement. --Further IV KCl replacement --Goal K>4, Mg>2 given runs of SVT --Mg level pending, but AM was 2.1  Subjective: Pt awake sitting up in bed with multiple family members at bedside.  Seen after returning  from ultrasound and nuc med today.  Patient and family had already met with palliative care earlier today and decision has been made to discharge home soon with hospice.  Patient denies any acute complaints at this time, she says she just remains tired.  Physical Exam: Vitals:   10/19/21 0700 10/19/21 0800 10/19/21 0900 10/19/21 1000  BP:  93/81  107/81  Pulse: (!) 45 85  (!) 36  Resp: (!) 50 (!) 37 (!) 42 (!) 43  Temp:      TempSrc:      SpO2: 96% 93%  93%  Weight:      Height:       General exam: awake and alert, no acute distress HEENT: moist mucus membranes, hearing grossly normal  Respiratory system: CTAB with diminished bases, no wheezes or rhonchi, normal respiratory effort. Cardiovascular system: normal S1/S2, RRR   Gastrointestinal system: soft, NT, ND Central nervous system: A&O x3. no gross focal neurologic deficits, normal speech Extremities: resolved lower extremity edema, normal tone Skin: dry, intact, normal temperature Psychiatry: normal mood, congruent affect, judgement and insight appear normal d  Data Reviewed: Notable labs ---   K 5.4 with repeat 4.8 this afternoon, creatinine 2.32 up slightly from 2.17, BUN 87, uric acid elevated 12.9, CRP elevated 23.9 but normal sed rate.  WBC 15.9, platelets 99 down from 123.    Bilateral lower extremity Doppler ultrasounds negative for DVTs bilaterally.    NM pulmonary perfusion scan intermediate probability for PE but findings much more likely due to explained by pleural effusions.  Family Communication: daughter and multiple other family members at bedside this afternoon.    Disposition: Status is: Inpatient Remains inpatient appropriate because: remains on Lasix drip, hypotensive, ongoing goals of care discussions, remains critically ill   Planned Discharge Destination: Home     Critical care Time spent: 45 minutes.  Patient critically ill with decompensated heart failure, remains on Lasix drip  Patient is  high risk for morbidity and mortality    Author: Ezekiel Slocumb, DO 10/19/2021 3:39 PM  For on call review [email or web address].

## 2021-10-19 NOTE — TOC Progression Note (Signed)
Transition of Care [facility]) - Progression Note    Patient Details  Name: [name] MRN: [id number] Date of Birth: 1940/11/06  Transition of Care [facility] CM/SW Contact  Shelbie Hutching, RN Phone Number: 10/19/2021, 3:35 PM  Clinical Narrative:    Palliative care met with family and they have decided that they would like to go home with hospice.  Lorayne Bender with [facility] has referral and is waiting for return call from patient's daughter.  Potential plan for DC with hospice to home tomorrow.    Expected Discharge Plan: Home w Hospice Care Barriers to Discharge: Continued Medical Work up  Expected Discharge Plan and Services Expected Discharge Plan: [place]   Discharge Planning Services: CM Consult Post Acute Care Choice: Hospice Living arrangements for the past 2 months: Single Family Home                                       Social Determinants of Health (SDOH) Interventions    Readmission Risk Interventions     No data to display

## 2021-10-19 NOTE — Progress Notes (Addendum)
[facility] Cardiology  Patient ID: Zoe Boyle MRN: [id number] DOB/AGE: 10/16/1940 81 y.o.   Admit date: 10/11/2021 Referring Physician Dr. Shelly Coss Primary Physician Dr. Tressia Miners  Primary Cardiologist Dr. Clayborn Bigness Reason for Consultation acute on chronic HFrEF   HPI: Zoe Boyle is a 81yoF with a PMH of dilated cardiomyopathy, HFrEF (LVEF 25-30% with global hypo, severe MR 08/02/2021), small pericardial effusion, hypertension, hyperlipidemia, CKD 3 who presented to [facility] ED 10/11/2021 with shortness of breath and associated leg swelling.  Cardiology is consulted on hospital day 2 for assistance with her heart failure.  Interval History: -aggressively replaced K+ yesterday afternoon, lasix gtt paused for several hours and restarted overnight, per nursing report this morning she had an minimal urine output overnight, but has picked up slightly this morning with about 175 mL in the Foley bag. -Overall has diuresed a net -5 L, but renal function remains tenuous, slightly worsening today, and pulmonary edema and pleural effusions remain relatively unchanged per x-ray. -Heart rate and rhythm have improved with less ectopy after electrolytes were corrected, but carvedilol had to be discontinued due to hypotension yesterday -She was seen by palliative care prior to my arrival.  The patient says she feels "all right", denies chest pain or shortness of breath, she is taking a couple bites of breakfast but still has minimal oral intake since admission.  When asked about how her conversation with palliative care went she says "I do not like talking about that" and declines further discussions.   Vitals:   10/19/21 0300 10/19/21 0400 10/19/21 0500 10/19/21 0600  BP:  99/78  106/82  Pulse: (!) 44 89 76 (!) 47  Resp: (!) 39 (!) 49 17 (!) 44  Temp:  98.7 F (37.1 C)    TempSrc:  Oral    SpO2: 96% 98% 100% 93%  Weight:  48.8 kg    Height:         Intake/Output Summary (Last 24 hours) at 10/19/2021  0751 Last data filed at 10/19/2021 0500 Gross per 24 hour  Intake 385.39 ml  Output 1335 ml  Net -949.61 ml     PHYSICAL EXAM General: Elderly and frail-appearing black female, in no acute distress. Sitting upright in ICU bed, with her daughter at bedside HEENT:  Normocephalic and atraumatic. Neck:   No JVD.  Lungs: Somewhat short of breath appearing with nasal cannula hanging off her ear if she takes bites of breakfast.  SPO2 greater than 90% at rest, but desats to 88 with deep respirations.  Decreased breath sounds R base, crackles on L without wheezes Heart: regular rate and rhythm with frequent premature beats Normal S1 and S2.  2/6 systolic murmur best heard at the apex.  Abdomen: Non-distended appearing.  Msk: Significant generalized weakness  Extremities: Right knee with tenderness to light palpation, swollen compared to left knee.  Warm to touch. No clubbing, cyanosis.  1+ pitting pedal edema L >R but overall improving. Neuro: Alert and oriented X 3. Psych:  Answers simple questions but is a limited historian.Marland Kitchen    LABS: Basic Metabolic Panel: Recent Labs    10/18/21 0417 10/18/21 1253 10/18/21 2011 10/18/21 2012 10/19/21 0314  NA 137  --   --   --  136  K 3.0*   < >  --  4.9 5.4*  CL 97*  --   --   --  99  CO2 26  --   --   --  24  GLUCOSE 125*  --   --   --  118*  BUN 82*  --   --   --  87*  CREATININE 2.17*  --   --   --  2.32*  CALCIUM 9.3  --   --   --  9.4  MG 2.1  --  2.2  --  2.1   < > = values in this interval not displayed.    Liver Function Tests: No results for input(s): "AST", "ALT", "ALKPHOS", "BILITOT", "PROT", "ALBUMIN" in the last 72 hours. No results for input(s): "LIPASE", "AMYLASE" in the last 72 hours. CBC: Recent Labs    10/16/21 1354 10/17/21 0547 10/18/21 0417 10/19/21 0314  WBC 8.0 6.0 5.3 15.9*  NEUTROABS 5.3 3.3  --   --   HGB 11.2* 11.3* 11.5* 12.3  HCT 34.9* 34.1* 34.5* 36.9  MCV 82.7 79.3* 78.1* 78.3*  PLT 128* 118* 123*  99*    Cardiac Enzymes: No results for input(s): "CKTOTAL", "CKMB", "CKMBINDEX", "TROPONINI" in the last 72 hours. BNP: Invalid input(s): "POCBNP" D-Dimer: Recent Labs    10/18/21 2012  DDIMER 1.78*    Hemoglobin A1C: No results for input(s): "HGBA1C" in the last 72 hours. Fasting Lipid Panel: No results for input(s): "CHOL", "HDL", "LDLCALC", "TRIG", "CHOLHDL", "LDLDIRECT" in the last 72 hours. Thyroid Function Tests: No results for input(s): "TSH", "T4TOTAL", "T3FREE", "THYROIDAB" in the last 72 hours.  Invalid input(s): "FREET3" Anemia Panel: No results for input(s): "VITAMINB12", "FOLATE", "FERRITIN", "TIBC", "IRON", "RETICCTPCT" in the last 72 hours.  DG Chest Port 1 View  Result Date: 10/18/2021 CLINICAL DATA:  Acute on chronic congestive heart failure EXAM: PORTABLE CHEST 1 VIEW COMPARISON:  10/16/2021 FINDINGS: Single frontal view of the chest demonstrates stable marked enlargement of the cardiac silhouette. There is continued central vascular congestion, with bibasilar veiling opacities consistent with edema and effusion. No pneumothorax. IMPRESSION: 1. Congestive heart failure, without significant change since prior study. Electronically Signed   By: Randa Ngo M.D.   On: 10/18/2021 19:05   DG Knee 1-2 Views Left  Result Date: 10/18/2021 CLINICAL DATA:  Left knee pain and none. EXAM: LEFT KNEE - 1-2 VIEW COMPARISON:  None Available. FINDINGS: There is diffuse decreased bone mineralization. Likely moderate knee joint effusion. Minimal chronic enthesopathic change at the quadriceps insertion on the patella. Mild-to-moderate patellofemoral peripheral degenerative osteophytes. Moderate compartment joint space narrowing. Moderate peripheral medial compartment degenerative osteophytosis. No acute fracture or dislocation. 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 Moderate knee joint effusion. Electronically Signed   By: Yvonne Kendall M.D.   On: 10/18/2021 10:20     Echo EF 30-35%, severe biatrial enlargement, moderate to severe mitral and tricuspid regurgitation  TELEMETRY reviewed by me (LT 9/21): SR LBBB with frequent PVCs and PACs rate mostly 80s to 90s, less ectopy than yesterday  EKG: 9/18, SR LBBB frequent PACs, 9/20 sinus tach with LBBB nonspecific ST changes  Data reviewed by me (LT) 9/21: Palliative care note, hospitalist note, nephrology note, chest x-ray, CBC, BMP, I's and O's, telemetry, vitals, discussed with nursing and palliative care  ASSESSMENT AND PLAN:  Principal Problem:   Acute on chronic systolic CHF (congestive heart failure) ([place]) Active Problems:   Acute renal failure superimposed on stage 3a chronic kidney disease ([place])   Hypertension   Hypokalemia   Depression   Thrombocytopenia (HCC)   Protein-calorie malnutrition, severe   SVT (supraventricular tachycardia) ([place])    1. Acute on chronic systolic congestive heart failure, HFrEF EF 25-30% 08/02/2021, slight clinical improvement after initial diuresis, with resultant  worsening renal function, seen by nephrology, started on furosemide infusion, worsening respiratory status overnight on 9/17 resulting in transfer to stepdown.  Attempted reinitiation of beta-blocker 9/20, discontinued due to hypotension 2.  Acute renal failure with underlying CKD stage III, followed by nephrology, slowly improving with Lasix infusion and metolazone, but is overall not a great candidate for dialysis 3.  Elevated troponin (30, 32, 28), most consistent with myocardial injury due to chronic kidney disease without ischemia 4.  Chronic LBBB 5.  Moderate to severe mitral and tricuspid regurgitation 6.  Moderate right pleural effusion 7.  Left knee swelling, query gout, elevated CRP and uric acid   Recommendations   1.  Agree current therapy 2.  Continue furosemide infusion 10 mg/h, will dose another metolazone 5 mg x 2 on 9/18 and 9/19.  Will give another 5  mg metolazone x1 today. 3.  Intolerant of carvedilol 3.125 mg due to hypotension, defer further GDMT with ACE/ARB/Entresto, Jardiance, spironolactone with tenuous renal function and low blood pressures 4.  Carefully monitor renal status, nephrology following 5.  Monitor electrolytes closely while on Lasix drip, cautious repletion of potassium with renal dysfunction. 6.  Addendum: Can continue torsemide at 40 mg once daily at discharge for symptom management of her edema, otherwise further symptom management per hospice care. 7.  Outpatient evaluation for CRT-D per Dr. Clayborn Bigness, pending further goals of care discussions. 8.   Appreciate palliative care involvement to discuss goals in the setting of her multiple comorbidities, tenuous renal function, and progressively worsening heart failure intolerant of GDMT, with poor prognosis from a cardiac standpoint - the patient and her daughter have ultimately decided to pursue hospice care, which is being coordinated today.  This patient's plan of care was discussed and created with Dr. Nehemiah Massed and he is in agreement.    Tristan Schroeder, PA-C 10/19/2021 7:51 AM

## 2021-10-20 ENCOUNTER — Encounter: Payer: Self-pay | Admitting: Internal Medicine

## 2021-10-20 DIAGNOSIS — Z7189 Other specified counseling: Secondary | ICD-10-CM | POA: Diagnosis not present

## 2021-10-20 DIAGNOSIS — Z515 Encounter for palliative care: Secondary | ICD-10-CM

## 2021-10-20 DIAGNOSIS — I5023 Acute on chronic systolic (congestive) heart failure: Secondary | ICD-10-CM | POA: Diagnosis not present

## 2021-10-20 LAB — BASIC METABOLIC PANEL
Anion gap: 10 (ref 5–15)
BUN: 94 mg/dL — ABNORMAL HIGH (ref 8–23)
CO2: 30 mmol/L (ref 22–32)
Calcium: 9.4 mg/dL (ref 8.9–10.3)
Chloride: 98 mmol/L (ref 98–111)
Creatinine, Ser: 2.19 mg/dL — ABNORMAL HIGH (ref 0.44–1.00)
GFR, Estimated: 22 mL/min — ABNORMAL LOW (ref >60)
Glucose, Bld: 141 mg/dL — ABNORMAL HIGH (ref 70–99)
Potassium: 3.5 mmol/L (ref 3.5–5.1)
Sodium: 138 mmol/L (ref 135–145)

## 2021-10-20 LAB — MAGNESIUM: Magnesium: 2.4 mg/dL (ref 1.7–2.4)

## 2021-10-20 MED ORDER — LORAZEPAM 2 MG/ML PO CONC: 0.5000 mg | Freq: Four times a day (QID) | ORAL | 0 refills | Status: AC | PRN

## 2021-10-20 MED ORDER — MORPHINE SULFATE 20 MG/5ML PO SOLN: 2.5000 mg | ORAL | 0 refills | Status: DC | PRN

## 2021-10-20 MED ORDER — LIDOCAINE 5 % EX PTCH: 1.0000 | MEDICATED_PATCH | CUTANEOUS | 0 refills | Status: AC

## 2021-10-20 MED ORDER — ACETAMINOPHEN 325 MG PO TABS: 650.0000 mg | ORAL_TABLET | Freq: Four times a day (QID) | ORAL | Status: AC | PRN

## 2021-10-20 MED ORDER — RISAQUAD PO CAPS: 1.0000 | ORAL_CAPSULE | Freq: Every day | ORAL | 0 refills | Status: AC

## 2021-10-20 MED ORDER — ONDANSETRON HCL 4 MG PO TABS: 4.0000 mg | ORAL_TABLET | Freq: Four times a day (QID) | ORAL | 0 refills | Status: AC | PRN

## 2021-10-20 MED ORDER — POTASSIUM CHLORIDE CRYS ER 20 MEQ PO TBCR
40.0000 meq | EXTENDED_RELEASE_TABLET | Freq: Once | ORAL | Status: AC
  Administered 2021-10-20: 40 meq via ORAL
  Filled 2021-10-20: qty 2

## 2021-10-20 MED ORDER — MORPHINE SULFATE (CONCENTRATE) 20 MG/ML PO SOLN: 5.0000 mg | ORAL | 0 refills | Status: AC | PRN

## 2021-10-20 MED ORDER — MEGESTROL ACETATE 400 MG/10ML PO SUSP: 320.0000 mg | Freq: Every day | ORAL | 0 refills | Status: AC

## 2021-10-20 MED ORDER — PREDNISONE 10 MG PO TABS: ORAL_TABLET | ORAL | 0 refills | Status: AC

## 2021-10-20 MED ORDER — LIDOCAINE 5 % EX PTCH
1.0000 | MEDICATED_PATCH | CUTANEOUS | Status: DC
  Administered 2021-10-20: 1 via TRANSDERMAL
  Filled 2021-10-20: qty 1

## 2021-10-20 NOTE — Progress Notes (Signed)
Daily Progress Note   Patient Name: Zoe Boyle       Date: 10/20/2021 DOB: 1940-03-01  Age: 81 y.o. MRN#: [id number] Attending Physician: Ezekiel Slocumb, DO Primary Care Physician: Gladstone Lighter, MD Admit Date: 10/11/2021  Reason for Consultation/Follow-up: Establishing goals of care  Subjective: Notes reviewed. Patient is resting in bed with no distress noted. Patient is comfort care. Staff is working on getting patient discharged home with hospice. Packet left this morning in ICU by daughter with FMLA forms to be completed. Spoke with hospice liaison via phone, who states she will speak with patient's daughter. Forms left in discharge papers to go home with patient, for [facility] to pick up from the home.     Length of Stay: 9  Current Medications: Scheduled Meds:   acidophilus  1 capsule Oral Daily   Chlorhexidine Gluconate Cloth  6 each Topical Daily   feeding supplement (NEPRO CARB STEADY)  237 mL Oral TID BM   heparin injection (subcutaneous)  5,000 Units Subcutaneous Q8H   lidocaine  1 patch Transdermal Q24H   megestrol  320 mg Oral Daily   mirtazapine  15 mg Oral QHS   multivitamin with minerals  1 tablet Oral Daily   pravastatin  20 mg Oral Daily   predniSONE  40 mg Oral Q breakfast    Continuous Infusions:  sodium chloride 5 mL/hr at 10/19/21 2003   furosemide (LASIX) 200 mg in dextrose 5 % 100 mL (2 mg/mL) infusion Stopped (10/20/21 0810)   promethazine (PHENERGAN) injection (IM or IVPB)      PRN Meds: sodium chloride, acetaminophen **OR** acetaminophen, dextrose, ondansetron **OR** ondansetron (ZOFRAN) IV, mouth rinse, promethazine (PHENERGAN) injection (IM or IVPB)  Physical Exam Pulmonary:     Effort: Pulmonary effort is normal.  Neurological:      Mental Status: She is alert.             Vital Signs: BP 90/71   Pulse (!) 39   Temp 97.9 F (36.6 C) (Oral)   Resp 20   Ht 5' (1.524 m)   Wt 47.9 kg   SpO2 99%   BMI 20.62 kg/m  SpO2: SpO2: 99 % O2 Device: O2 Device: Nasal Cannula O2 Flow Rate: O2 Flow Rate (L/min): 2 L/min  Intake/output summary:  Intake/Output Summary (Last 24 hours) at  10/20/2021 1226 Last data filed at 10/20/2021 1059 Gross per 24 hour  Intake 103.01 ml  Output 1800 ml  Net -1696.99 ml   LBM: Last BM Date : 10/19/21 Baseline Weight: Weight: 50.4 kg Most recent weight: Weight: 47.9 kg    Patient Active Problem List   Diagnosis Date Noted   Hospice care patient 10/20/2021   Pain and swelling of left knee 10/19/2021   SVT (supraventricular tachycardia) ([place]) 10/18/2021   Protein-calorie malnutrition, severe 10/15/2021   Thrombocytopenia ([place]) 10/11/2021   Elevated troponin 10/11/2021   Myocardial injury 08/01/2021   HLD (hyperlipidemia) 08/01/2021   Acute on chronic systolic CHF (congestive heart failure) ([place]) 08/01/2021   Depression 08/01/2021   Hypokalemia 07/12/2021   Sepsis due to group A Streptococcus with acute renal failure without septic shock ([place]) 07/12/2021   Edema of face 07/11/2021   Anemia of chronic disease 07/09/2021   Hypertension    Generalized weakness    Protein-calorie malnutrition, moderate (HCC) 56/31/4970   Chronic systolic CHF (congestive heart failure) ([place]) 12/31/2019   Acute renal failure superimposed on stage 3a chronic kidney disease ([place]) 12/31/2019    Palliative Care Assessment & Plan    Recommendations/Plan: Home with hospice    Code Status:    Code Status Orders  (From admission, onward)           Start     Ordered   10/19/21 1448  Do not attempt resuscitation (DNR)  Continuous       Question Answer Comment  In the event of cardiac or respiratory ARREST Do not call a "code blue"   In the event of cardiac or respiratory ARREST Do not perform  Intubation, CPR, defibrillation or ACLS   In the event of cardiac or respiratory ARREST Use medication by any route, position, wound care, and other measures to relive pain and suffering. May use oxygen, suction and manual treatment of airway obstruction as needed for comfort.   Comments MOST form on chart      10/19/21 1447           Code Status History     Date Active Date Inactive Code Status Order ID Comments User Context   10/12/2021 0249 10/19/2021 1447 Full Code [id number]  Athena Masse, MD Inpatient   08/01/2021 0724 08/04/2021 2211 Full Code [id number]  Ivor Costa, MD ED   07/10/2021 0306 07/13/2021 2253 Full Code [id number]  Athena Masse, MD Inpatient   12/31/2019 2127 01/02/2020 1842 Full Code [id number]  Collier Bullock, MD ED       Care plan was discussed with RN and hospice liaison and primary MD.   Thank you for allowing the Palliative Medicine Team to assist in the care of this patient.   Asencion Gowda, NP  Please contact Palliative Medicine Team phone at [phone] for questions and concerns.

## 2021-10-20 NOTE — Progress Notes (Addendum)
Manufacturing engineer [facility]) Hospital Liaison Note  Follow up visit made to new referral for [facility] hospice services at home. Patient seen asleep in bed.  Writer spoke via telephone to patient's daughter Tressia Miners. DME has been delivered. Hospice will assist with FMLA paperwork after patient is admitted to services at home. Writer did update Traci with this information.  TOC Meagan Hagwood notified and will arrange non-emergent transport.  Writer requested that staff RN Legrand Como call patient's daughter when EMS arrives.  Please send signed and completed DNR home with patient/family. Please provide prescriptions at discharge as needed to ensure ongoing symptom management.   Thank you for the opportunity to be involved in the care of this patient and her family.  Flo Shanks BSN, RN [facility] [employer] [phone]

## 2021-10-20 NOTE — TOC Transition Note (Addendum)
Transition of Care [facility]) - CM/SW Discharge Note   Patient Details  Name: Zoe Boyle MRN: [id number] Date of Birth: 27-Apr-1940  Transition of Care [facility]) CM/SW Contact:  Magnus Ivan, LCSW Phone Number: 10/20/2021, 12:39 PM   Clinical Narrative:    [facility] called for transport to [place], [place]. Patient is next on the list.  Patient discharging home with [facility], confirmed with Santiago Glad and hospice has updated the family.    Final next level of care: Home w Hospice Care Barriers to Discharge: Barriers Resolved   Patient Goals and CMS Choice Patient states their goals for this hospitalization and ongoing recovery are:: Patient and family have decided on hospice at home CMS Medicare.gov Compare Post Acute Care list provided to:: Patient Represenative (must comment) Choice offered to / list presented to : Adult Children  Discharge Placement                       Discharge Plan and Services   Discharge Planning Services: CM Consult Post Acute Care Choice: Hospice                               Social Determinants of Health (SDOH) Interventions     Readmission Risk Interventions     No data to display

## 2021-10-20 NOTE — Discharge Summary (Addendum)
Physician Discharge Summary   Patient: Zoe Boyle MRN: [id number] DOB: 03/17/1940  Admit date:     10/11/2021  Discharge date: 10/20/21  Discharge Physician: Ezekiel Slocumb   PCP: Gladstone Lighter, MD   Recommendations at discharge:    Follow up with hospice team upon after discharge  Discharge Diagnoses: Principal Problem:   Acute on chronic systolic CHF (congestive heart failure) ([place]) Active Problems:   Hospice care patient   Acute renal failure superimposed on stage 3a chronic kidney disease (HCC)   Hypertension   Protein-calorie malnutrition, severe   Thrombocytopenia (HCC)   Depression   SVT (supraventricular tachycardia) (HCC)   Pain and swelling of left knee  Resolved Problems:   Hypokalemia  Hospital Course: Mrs. Trawick was admitted to the hospital with the working diagnosis of decompensated heart failure.   81 yo female with the past medical history of congested heart failure, hypertension, dyslipidemia and CKD stage 3a who presented with dyspnea and lower extremity edema. Reported 3 to 4 days of dyspnea, lower extremity edema despite taking diuretic therapy at home. Recent hospitalization 06/11 to 07/13/21 for group A strep bacteremia and 07/23 for heart failure. On her initial physical examination her blood pressure was 99/77, rr 24 to 32, HR 72 and 02 saturation 96% on room air, she had increased work of breathing, heart with S1 and S2 present and rhythmic, abdomen not distended and positive lower extremity edema.   NA 137, K 3,4 CL 106, bicarbonate 18, glucose 160 un 53 cr 1,72  High sensitive troponin 46 and 40  Wbc 4,7 hgb 121 plt 86   Chest radiograph with significant cardiomegaly, bilateral hilar vascular congestion and bilateral small pleural effusions.   EKG 79 bpm, right axis, qtc 525, left bundle branch block, sinus rhythm with first degree AV block, J point elevation V1 to V3, no significant T wave changes.  Patient was placed on furosemide for  diuresis. Echocardiogram with reduced LV systolic function.     Additional hospital course & events as outlined below.  On 9/21, palliative care met with patient and family members and decision was made to transition to hospice / comfort care.  Patient is stable for discharge home today. Comfort medications have been sent to pharmacy and DME delivered to home.     Assessment and Plan: * Acute on chronic systolic CHF (congestive heart failure) (HCC) Echocardiogram with LV systolic function 30 to 51%, mild dilatated LV cavity, RV systolic function preserved, severe dilatation LA and RA, severe TR, severe MR.  --Volume status and renal function improved with Lasix drip --Further diuresis and GDMT limited by hypotension --Nephrology and cardiology are following.  -- Hold RAS inhibition with AKI -- Hold on B blockade with  decompensated heart failure and now soft BP's.   Acute hypoxemic respiratory failure due to cardiogenic pulmonary edema.  --supplement O2 PRN to keep sats > 90%, wean as tolerated  Hospice care patient Hospice to follow patient at home after discharge. Comfort meds sent to pharmacy. Notify hospice MD if any signs of uncontrolled pain, distress, anxiety etc... despite ordered medications.   Acute renal failure superimposed on stage 3a chronic kidney disease (HCC) Metabolic Acidosis - due to renal failure. Hyperkalemia - resolved, recurrent 9/21 K 5.4. --renal function improved on Lasix drip --Nephrology consulted   Hypertension Hold antihypertensive medications to prevent hypotension.   Protein-calorie malnutrition, severe Continue with nutritional supplements.   Thrombocytopenia (HCC) Leukopenia - resolved.  Platelet count stable 128>>118>>123k -- Hold on aspirin.  Bloody bowel reported earlier this admission, has history of hemorrhoids -- Hold aspirin   Depression Continue mirtazapine  Pain and swelling of left knee Gout Flare - no prior known  diagnosis of gout. Pt developed severely tender left knee pain and swelling.  Xray showed moderate joint effusion.  Uric acid and CRP elevated. --Started on Prednisone, taper sent for discahrge --Lidocaine patch or cream to left knee --Can use topical Voltaren gel if lidocaine not helpful --Anti-inflammatories contraindicated with renal failure  SVT (supraventricular tachycardia) ([place]) 9/20 - over course of the day, pt has had increasingly frequent runs of non-sustained SVT, soft BP's, tachypnea, but otherwise asymptomatic --D/C Coreg to prevent hypotension --Cardiology following and has been updated throughout the day --Electrolytes monitored and corrected  Elevated troponin Troponin of 40 likely secondary to demand ischemia Continue to trend Continue and Pravastatin    Hypokalemia-resolved as of 10/20/2021 Secondary to aggressive diuresis. K replacement is ongoing. No furhter labs or replacement needed per comofrt care         Consultants: Nephrology, Cardiology, Palliative Care Procedures performed: Echo, Perfusion scan, LE doppler U/S  Disposition: Hospice care at home Diet recommendation:  Regular diet DISCHARGE MEDICATION: Allergies as of 10/20/2021       Reactions   Lisinopril Swelling   TONGUE SWELLED        Medication List     STOP taking these medications    aspirin EC 81 MG tablet   azithromycin 250 MG tablet Commonly known as: Zithromax Z-Pak   carvedilol 6.25 MG tablet Commonly known as: COREG   multivitamin with minerals Tabs tablet   pravastatin 20 MG tablet Commonly known as: PRAVACHOL   spironolactone 25 MG tablet Commonly known as: ALDACTONE       TAKE these medications    acetaminophen 325 MG tablet Commonly known as: TYLENOL Take 2 tablets (650 mg total) by mouth every 6 (six) hours as needed for mild pain (or Fever >/= 101).   acidophilus Caps capsule Take 1 capsule by mouth daily. Start taking on: 2021/11/17    feeding supplement Liqd Take 237 mLs by mouth 3 (three) times daily between meals.   lidocaine 5 % Commonly known as: LIDODERM Place 1 patch onto the skin daily. Apply to LEFT KNEE.  Remove & Discard patch within 12 hours or as directed by MD Start taking on: 11/17/21   LORazepam 2 MG/ML concentrated solution Commonly known as: LORazepam Intensol Take 0.3-0.5 mLs (0.6-1 mg total) by mouth every 6 (six) hours as needed for anxiety or sleep.   megestrol 400 MG/10ML suspension Commonly known as: MEGACE Take 8 mLs (320 mg total) by mouth daily. Start taking on: 11/17/21   mirtazapine 15 MG tablet Commonly known as: REMERON Take 15 mg by mouth at bedtime.   morphine 20 MG/ML concentrated solution Commonly known as: ROXANOL Take 0.25 mLs (5 mg total) by mouth every 2 (two) hours as needed for severe pain.   ondansetron 4 MG disintegrating tablet Commonly known as: ZOFRAN-ODT Take 1 tablet (4 mg total) by mouth every 8 (eight) hours as needed for nausea or vomiting.   ondansetron 4 MG tablet Commonly known as: ZOFRAN Take 1 tablet (4 mg total) by mouth every 6 (six) hours as needed for nausea.   predniSONE 10 MG tablet Commonly known as: DELTASONE Take 4 tablets (40 mg total) by mouth daily with breakfast for 5 days, THEN 2 tablets (20 mg total) daily with breakfast for 3  days, THEN 1 tablet (10 mg total) daily with breakfast for 3 days, THEN 0.5 tablets (5 mg total) daily with breakfast for 2 days. Start taking on: Nov 20, 2021   Torsemide 40 MG Tabs Take 40 mg by mouth daily.        Discharge Exam: Filed Weights   10/18/21 0456 10/19/21 0400 10/20/21 0500  Weight: 49.5 kg 48.8 kg 47.9 kg   General exam: awake, appears drowsy, no acute distress, frail HEENT: atraumatic, clear conjunctiva, anicteric sclera, moist mucus membranes, hearing grossly normal  Respiratory system: diminished bases, no wheezes, normal respiratory effort. Cardiovascular  system: normal S1/S2, RRR, no pedal edema.   Gastrointestinal system: soft, NT, ND, no HSM felt, +bowel sounds. Central nervous system: no gross focal neurologic deficits, normal speech Extremities: left knee swelling and tenderness, no edema, normal tone Skin: dry, intact, normal temperature Psychiatry: normal mood, congruent affect, judgement and insight appear normal   Condition at discharge: stable  The results of significant diagnostics from this hospitalization (including imaging, microbiology, ancillary and laboratory) are listed below for reference.   Imaging Studies: US Venous Img Lower Bilateral (DVT)  Result Date: 10/19/2021 CLINICAL DATA:  Elevated D-dimer.  Evaluate for DVT. EXAM: BILATERAL LOWER EXTREMITY VENOUS DOPPLER ULTRASOUND TECHNIQUE: Gray-scale sonography with graded compression, as well as color Doppler and duplex ultrasound were performed to evaluate the lower extremity deep venous systems from the level of the common femoral vein and including the common femoral, femoral, profunda femoral, popliteal and calf veins including the posterior tibial, peroneal and gastrocnemius veins when visible. The superficial great saphenous vein was also interrogated. Spectral Doppler was utilized to evaluate flow at rest and with distal augmentation maneuvers in the common femoral, femoral and popliteal veins. COMPARISON:  None Available. FINDINGS: RIGHT LOWER EXTREMITY Common Femoral Vein: No evidence of thrombus. Normal compressibility, respiratory phasicity and response to augmentation. Saphenofemoral Junction: No evidence of thrombus. Normal compressibility and flow on color Doppler imaging. Profunda Femoral Vein: No evidence of thrombus. Normal compressibility and flow on color Doppler imaging. Femoral Vein: No evidence of thrombus. Normal compressibility, respiratory phasicity and response to augmentation. Popliteal Vein: No evidence of thrombus. Normal compressibility, respiratory  phasicity and response to augmentation. Calf Veins: No evidence of thrombus. Normal compressibility and flow on color Doppler imaging. Superficial Great Saphenous Vein: No evidence of thrombus. Normal compressibility. Other Findings:  None. LEFT LOWER EXTREMITY Common Femoral Vein: No evidence of thrombus. Normal compressibility, respiratory phasicity and response to augmentation. Saphenofemoral Junction: No evidence of thrombus. Normal compressibility and flow on color Doppler imaging. Profunda Femoral Vein: No evidence of thrombus. Normal compressibility and flow on color Doppler imaging. Femoral Vein: No evidence of thrombus. Normal compressibility, respiratory phasicity and response to augmentation. Popliteal Vein: No evidence of thrombus. Normal compressibility, respiratory phasicity and response to augmentation. Calf Veins: No evidence of thrombus. Normal compressibility and flow on color Doppler imaging. Superficial Great Saphenous Vein: No evidence of thrombus. Normal compressibility. Other Findings:  None. IMPRESSION: No evidence of DVT within either lower extremity. Electronically Signed   By: Sandi Mariscal M.D.   On: 10/19/2021 13:02   NM Pulmonary Perfusion  Result Date: 10/19/2021 CLINICAL DATA:  Concern for pulmonary embolism. Tachycardia. Elevated D-dimer. EXAM: NUCLEAR MEDICINE PERFUSION LUNG SCAN TECHNIQUE: Perfusion images were obtained in multiple projections after intravenous injection of radiopharmaceutical. RADIOPHARMACEUTICALS:  4.2 mCi Tc-62m MAA COMPARISON:  Chest radiograph 10/18/2021 FINDINGS: There is marked decreased perfusion to the LEFT lower lobe. Cardiomegaly and large LEFT effusion noted  on comparison radiograph. There is angular perfusion defect in the RIGHT middle lobe extending towards the fissure. Large RIGHT effusion noted on comparison radiograph. IMPRESSION: There is poor perfusion to the LEFT lower lobe which corresponds to large pleural effusion seen on comparison CT and  cardiomegaly. Angular perfusion defect in the RIGHT middle lobe could represent effusion or infarction. Findings INDETERMINATE for lower lobe pulmonary emboli. No evidence of upper lobe pulmonary emboli. Consider lower extremity venous doppler ultrasound for further evaluation. Electronically Signed   By: Suzy Bouchard M.D.   On: 10/19/2021 12:44   DG Chest Port 1 View  Result Date: 10/18/2021 CLINICAL DATA:  Acute on chronic congestive heart failure EXAM: PORTABLE CHEST 1 VIEW COMPARISON:  10/16/2021 FINDINGS: Single frontal view of the chest demonstrates stable marked enlargement of the cardiac silhouette. There is continued central vascular congestion, with bibasilar veiling opacities consistent with edema and effusion. No pneumothorax. IMPRESSION: 1. Congestive heart failure, without significant change since prior study. Electronically Signed   By: Randa Ngo M.D.   On: 10/18/2021 19:05   DG Knee 1-2 Views Left  Result Date: 10/18/2021 CLINICAL DATA:  Left knee pain and none. EXAM: LEFT KNEE - 1-2 VIEW COMPARISON:  None Available. FINDINGS: There is diffuse decreased bone mineralization. Likely moderate knee joint effusion. Minimal chronic enthesopathic change at the quadriceps insertion on the patella. Mild-to-moderate patellofemoral peripheral degenerative osteophytes. Moderate compartment joint space narrowing. Moderate peripheral medial compartment degenerative osteophytosis. No acute fracture or dislocation. 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 Moderate knee joint effusion. Electronically Signed   By: Yvonne Kendall M.D.   On: 10/18/2021 10:20   DG Chest Port 1 View  Result Date: 10/16/2021 CLINICAL DATA:  Acute respiratory failure EXAM: PORTABLE CHEST 1 VIEW COMPARISON:  Chest x-ray 10/11/2020 FINDINGS: There is marked enlargement of the cardiac silhouette, unchanged. There is central pulmonary vascular congestion. Small pleural  effusions are present, increasing on the right. Bibasilar opacities persist. There is no pneumothorax or acute fracture. IMPRESSION: 1. Bilateral pleural effusions, increasing on the right. 2. Cardiomegaly with central pulmonary vascular congestion. 3. Bilateral airspace disease appears stable. Electronically Signed   By: Ronney Asters M.D.   On: 10/16/2021 03:24   ECHOCARDIOGRAM LIMITED  Result Date: 10/14/2021    ECHOCARDIOGRAM LIMITED REPORT   Patient Name:   Genoveva Singleton Date of Exam: 10/14/2021 Medical Rec #:  [id number]  Height:       60.0 in Accession #:    [id number] Weight:       118.4 lb Date of Birth:  01/18/1941  BSA:          1.494 m Patient Age:    81 years   BP:           107/72 mmHg Patient Gender: F          HR:           85 bpm. Exam Location:  [facility] Procedure: Limited Echo, Intracardiac Opacification Agent and Limited Color            Doppler Indications:     Acute Systolic CHF  History:         Patient has prior history of Echocardiogram examinations. CHF;                  Risk Factors:CKD, Dyslipidemia and Hypertension.  Sonographer:     L Thornton-Maynard Referring Phys:  Tristan Schroeder Diagnosing Phys: Isaias Cowman MD IMPRESSIONS  1. Left ventricular ejection fraction,  by estimation, is 30 to 35%. The left ventricle has moderately decreased function. The left ventricle has no regional wall motion abnormalities. The left ventricular internal cavity size was mildly dilated.  2. Right ventricular systolic function is normal. The right ventricular size is normal.  3. Left atrial size was severely dilated.  4. Right atrial size was severely dilated.  5. The mitral valve is normal in structure. Severe mitral valve regurgitation. No evidence of mitral stenosis.  6. Tricuspid valve regurgitation is severe.  7. The aortic valve is normal in structure. Aortic valve regurgitation is not visualized. No aortic stenosis is present.  8. The inferior vena cava is normal in size with greater than 50%  respiratory variability, suggesting right atrial pressure of 3 mmHg. FINDINGS  Left Ventricle: Left ventricular ejection fraction, by estimation, is 30 to 35%. The left ventricle has moderately decreased function. The left ventricle has no regional wall motion abnormalities. Definity contrast agent was given IV to delineate the left ventricular endocardial borders. The left ventricular internal cavity size was mildly dilated. There is no left ventricular hypertrophy. Right Ventricle: The right ventricular size is normal. No increase in right ventricular wall thickness. Right ventricular systolic function is normal. Left Atrium: Left atrial size was severely dilated. Right Atrium: Right atrial size was severely dilated. Pericardium: There is no evidence of pericardial effusion. Mitral Valve: The mitral valve is normal in structure. Severe mitral valve regurgitation. No evidence of mitral valve stenosis. MV peak gradient, 14.6 mmHg. The mean mitral valve gradient is 7.0 mmHg. Tricuspid Valve: The tricuspid valve is normal in structure. Tricuspid valve regurgitation is severe. No evidence of tricuspid stenosis. Aortic Valve: The aortic valve is normal in structure. Aortic valve regurgitation is not visualized. No aortic stenosis is present. Pulmonic Valve: The pulmonic valve was normal in structure. Pulmonic valve regurgitation is not visualized. No evidence of pulmonic stenosis. Aorta: The aortic root is normal in size and structure. Venous: The inferior vena cava is normal in size with greater than 50% respiratory variability, suggesting right atrial pressure of 3 mmHg. IAS/Shunts: No atrial level shunt detected by color flow Doppler. Additional Comments: There is a small pleural effusion. LEFT VENTRICLE PLAX 2D LVIDd:         7.20 cm      Diastology LVIDs:         6.60 cm      LV e' medial:    4.29 cm/s LV PW:         0.80 cm      LV E/e' medial:  32.6 LV IVS:        0.90 cm      LV e' lateral:   7.01 cm/s LVOT  diam:     1.80 cm      LV E/e' lateral: 20.0 LV SV:         11 LV SV Index:   7 LVOT Area:     2.54 cm  LV Volumes (MOD) LV vol d, MOD A2C: 184.0 ml LV vol d, MOD A4C: 186.0 ml LV vol s, MOD A2C: 115.0 ml LV vol s, MOD A4C: 129.0 ml LV SV MOD A2C:     69.0 ml LV SV MOD A4C:     186.0 ml LV SV MOD BP:      59.1 ml RIGHT VENTRICLE         IVC TAPSE (M-mode): 1.2 cm  IVC diam: 3.30 cm AORTIC VALVE LVOT Vmax:   48.90 cm/s LVOT Vmean:  29.200 cm/s LVOT VTI:    0.043 m MITRAL VALVE                TRICUSPID VALVE MV Area (PHT): 4.31 cm     TR Peak grad:   20.4 mmHg MV Area VTI:   0.27 cm     TR Vmax:        226.00 cm/s MV Peak grad:  14.6 mmHg MV Mean grad:  7.0 mmHg     SHUNTS MV Vmax:       1.91 m/s     Systemic VTI:  0.04 m MV Vmean:      122.0 cm/s   Systemic Diam: 1.80 cm MV Decel Time: 176 msec MV E velocity: 140.00 cm/s MV A velocity: 84.40 cm/s MV E/A ratio:  1.66 Isaias Cowman MD Electronically signed by Isaias Cowman MD Signature Date/Time: 10/14/2021/10:33:44 AM    Final    DG Chest 2 View  Result Date: 10/11/2021 CLINICAL DATA:  Short of breath EXAM: CHEST - 2 VIEW COMPARISON:  Chest 09/12/2021 FINDINGS: Marked cardiac enlargement unchanged. This may be due to cardiomyopathy and/or pericardial effusion. Mild vascular congestion without edema. Progressive bibasilar airspace disease likely atelectasis. Possible small effusions. IMPRESSION: Marked cardiac enlargement which may be due to cardiomyopathy or pericardial effusion Mild bibasilar atelectasis/infiltrate Mild vascular congestion without edema Electronically Signed   By: Franchot Gallo M.D.   On: 10/11/2021 16:41    Microbiology: Results for orders placed or performed during the hospital encounter of 10/11/21  SARS Coronavirus 2 by RT PCR (hospital order, performed in [facility] hospital lab) *cepheid single result test* Anterior Nasal Swab     Status: None   Collection Time: 10/16/21  4:18 AM   Specimen: Anterior Nasal Swab   Result Value Ref Range Status   SARS Coronavirus 2 by RT PCR NEGATIVE NEGATIVE Final    Comment: (NOTE) SARS-CoV-2 target nucleic acids are NOT DETECTED.  The SARS-CoV-2 RNA is generally detectable in upper and lower respiratory specimens during the acute phase of infection. The lowest concentration of SARS-CoV-2 viral copies this assay can detect is 250 copies / mL. A negative result does not preclude SARS-CoV-2 infection and should not be used as the sole basis for treatment or other patient management decisions.  A negative result may occur with improper specimen collection / handling, submission of specimen other than nasopharyngeal swab, presence of viral mutation(s) within the areas targeted by this assay, and inadequate number of viral copies (<250 copies / mL). A negative result must be combined with clinical observations, patient history, and epidemiological information.  Fact Sheet for Patients:   https://www.patel.info/  Fact Sheet for Healthcare Providers: https://hall.com/  This test is not yet approved or  cleared by the [place] FDA and has been authorized for detection and/or diagnosis of SARS-CoV-2 by FDA under an Emergency Use Authorization (EUA).  This EUA will remain in effect (meaning this test can be used) for the duration of the COVID-19 declaration under Section 564(b)(1) of the Act, 21 U.S.C. section 360bbb-3(b)(1), unless the authorization is terminated or revoked sooner.  Performed at [facility], [address] [place], [place] [address]     Labs: CBC: Recent Labs  Lab 10/16/21 0352 10/16/21 1354 10/17/21 0547 10/18/21 0417 10/19/21 0314  WBC 3.8* 8.0 6.0 5.3 15.9*  NEUTROABS  --  5.3 3.3  --   --   HGB 13.0 11.2* 11.3* 11.5* 12.3  HCT 42.6 34.9* 34.1* 34.5* 36.9  MCV 85.0  82.7 79.3* 78.1* 78.3*  PLT 120* 128* 118* 123* 99*   Basic Metabolic Panel: Recent Labs  Lab  10/16/21 0352 10/16/21 0717 10/16/21 1354 10/17/21 0547 10/18/21 0417 10/18/21 1253 10/18/21 2011 10/18/21 2012 10/19/21 0314 10/19/21 1316 10/20/21 0556  NA 137   < > 139 140 137  --   --   --  136  --  138  K 5.7*   < > 5.8* 4.0 3.0* 2.8*  --  4.9 5.4* 4.8 3.5  CL 106   < > 106 103 97*  --   --   --  99  --  98  CO2 9*   < > 19* 24 26  --   --   --  24  --  30  GLUCOSE 135*   < > 150* 110* 125*  --   --   --  118*  --  141*  BUN 74*   < > 76* 79* 82*  --   --   --  87*  --  94*  CREATININE 2.70*   < > 2.53* 2.43* 2.17*  --   --   --  2.32*  --  2.19*  CALCIUM 9.5   < > 8.9 9.3 9.3  --   --   --  9.4  --  9.4  MG 2.9*  --   --   --  2.1  --  2.2  --  2.1  --  2.4   < > = values in this interval not displayed.   Liver Function Tests: No results for input(s): "AST", "ALT", "ALKPHOS", "BILITOT", "PROT", "ALBUMIN" in the last 168 hours. CBG: Recent Labs  Lab 10/16/21 0249 10/16/21 0301 10/16/21 0359  GLUCAP 39* 282* 117*    Discharge time spent: greater than 30 minutes.  Signed: Ezekiel Slocumb, DO [facility] 10/20/2021

## 2021-10-20 NOTE — TOC Progression Note (Signed)
Transition of Care [facility]) - Progression Note    Patient Details  Name: Zoe Boyle MRN: [id number] Date of Birth: 1940/03/18  [facility] CM/SW [place], LCSW Phone Number: 10/20/2021, 11:07 AM  Clinical Narrative:    Patient to DC home today with [facility]. Confirmed with [facility] Representative Santiago Glad. EMS Paperwork completed, DNR left on chart for DO to sign. Will call for EMS when notified by hospice that DME has been delivered to the home.    Expected Discharge Plan: Home w Hospice Care Barriers to Discharge: Continued Medical Work up  Expected Discharge Plan and Services Expected Discharge Plan: [place]   Discharge Planning Services: CM Consult Post Acute Care Choice: Hospice Living arrangements for the past 2 months: Single Family Home                                       Social Determinants of Health (SDOH) Interventions    Readmission Risk Interventions     No data to display

## 2021-10-20 NOTE — Assessment & Plan Note (Signed)
Hospice to follow patient at home after discharge. Comfort meds sent to pharmacy. Notify hospice MD if any signs of uncontrolled pain, distress, anxiety etc... despite ordered medications.

## 2021-10-20 NOTE — Progress Notes (Signed)
Pt. Discharged home with EMS, V/S are stable and pt. Has no complaints at this time. All belongings and paperwork sent with the pt. And her Daughter has been called and made aware that EMS is on the way. Pt. Educated concerning discharge instructions and all questions answered.

## 2021-10-24 ENCOUNTER — Ambulatory Visit: Payer: Medicare Other | Admitting: Family

## 2021-10-25 LAB — BLOOD GAS, ARTERIAL
Acid-base deficit: 18.8 mmol/L — ABNORMAL HIGH (ref 0.0–2.0)
Bicarbonate: 7.5 mmol/L — ABNORMAL LOW (ref 20.0–28.0)
O2 Content: 2 L/min
O2 Saturation: 94.6 %
Patient temperature: 37
pCO2 arterial: 20 mmHg — ABNORMAL LOW (ref 32–48)
pH, Arterial: 7.18 — CL (ref 7.35–7.45)
pO2, Arterial: 76 mmHg — ABNORMAL LOW (ref 83–108)

## 2021-10-29 DEATH — deceased

## 2021-12-30 IMAGING — CR DG CHEST 2V
2 series · 2 of 2 positions shown · non-contrast
Comparison: 10/18/2019

CLINICAL DATA: Productive cough

EXAM:
CHEST - 2 VIEW

[chest pa]
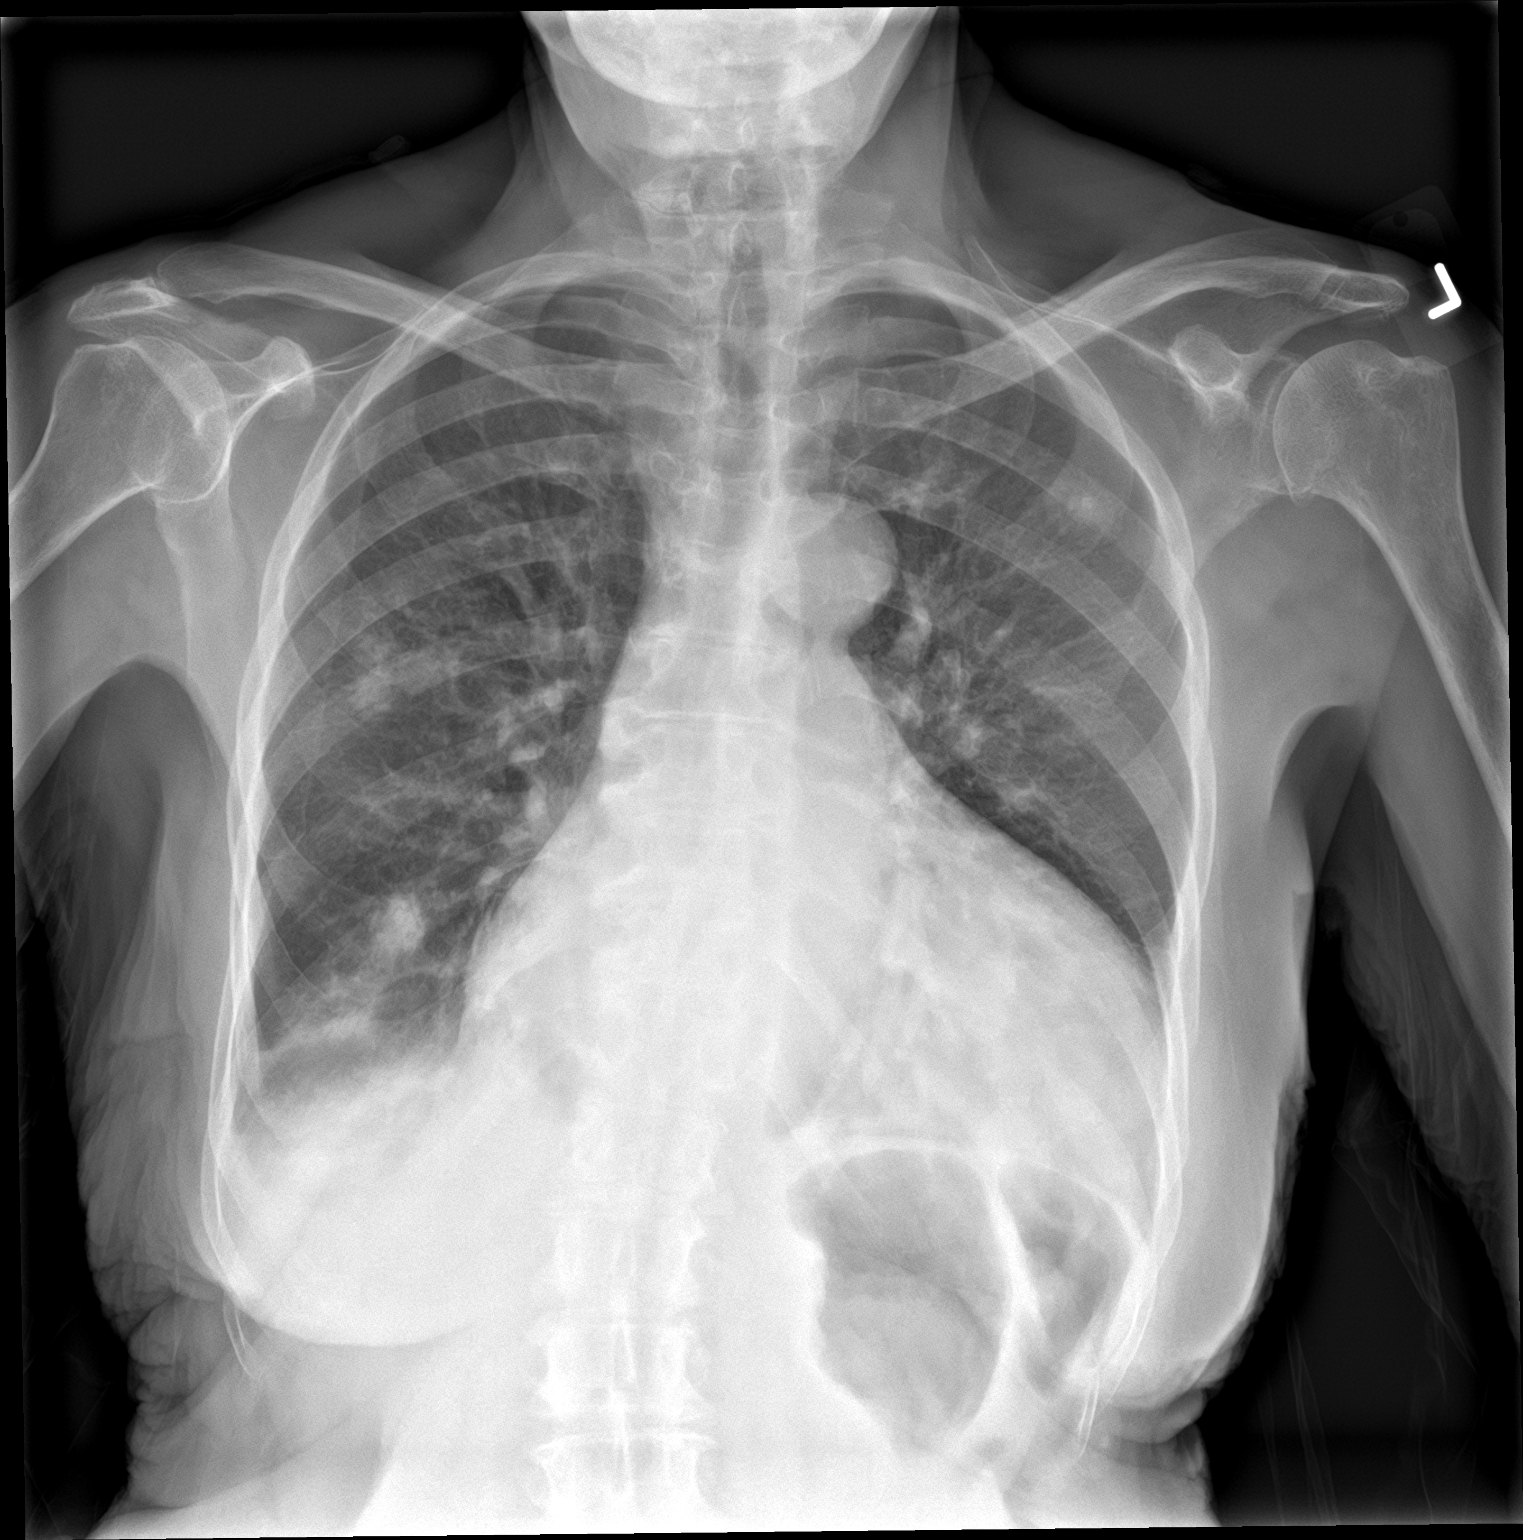

[chest lat]
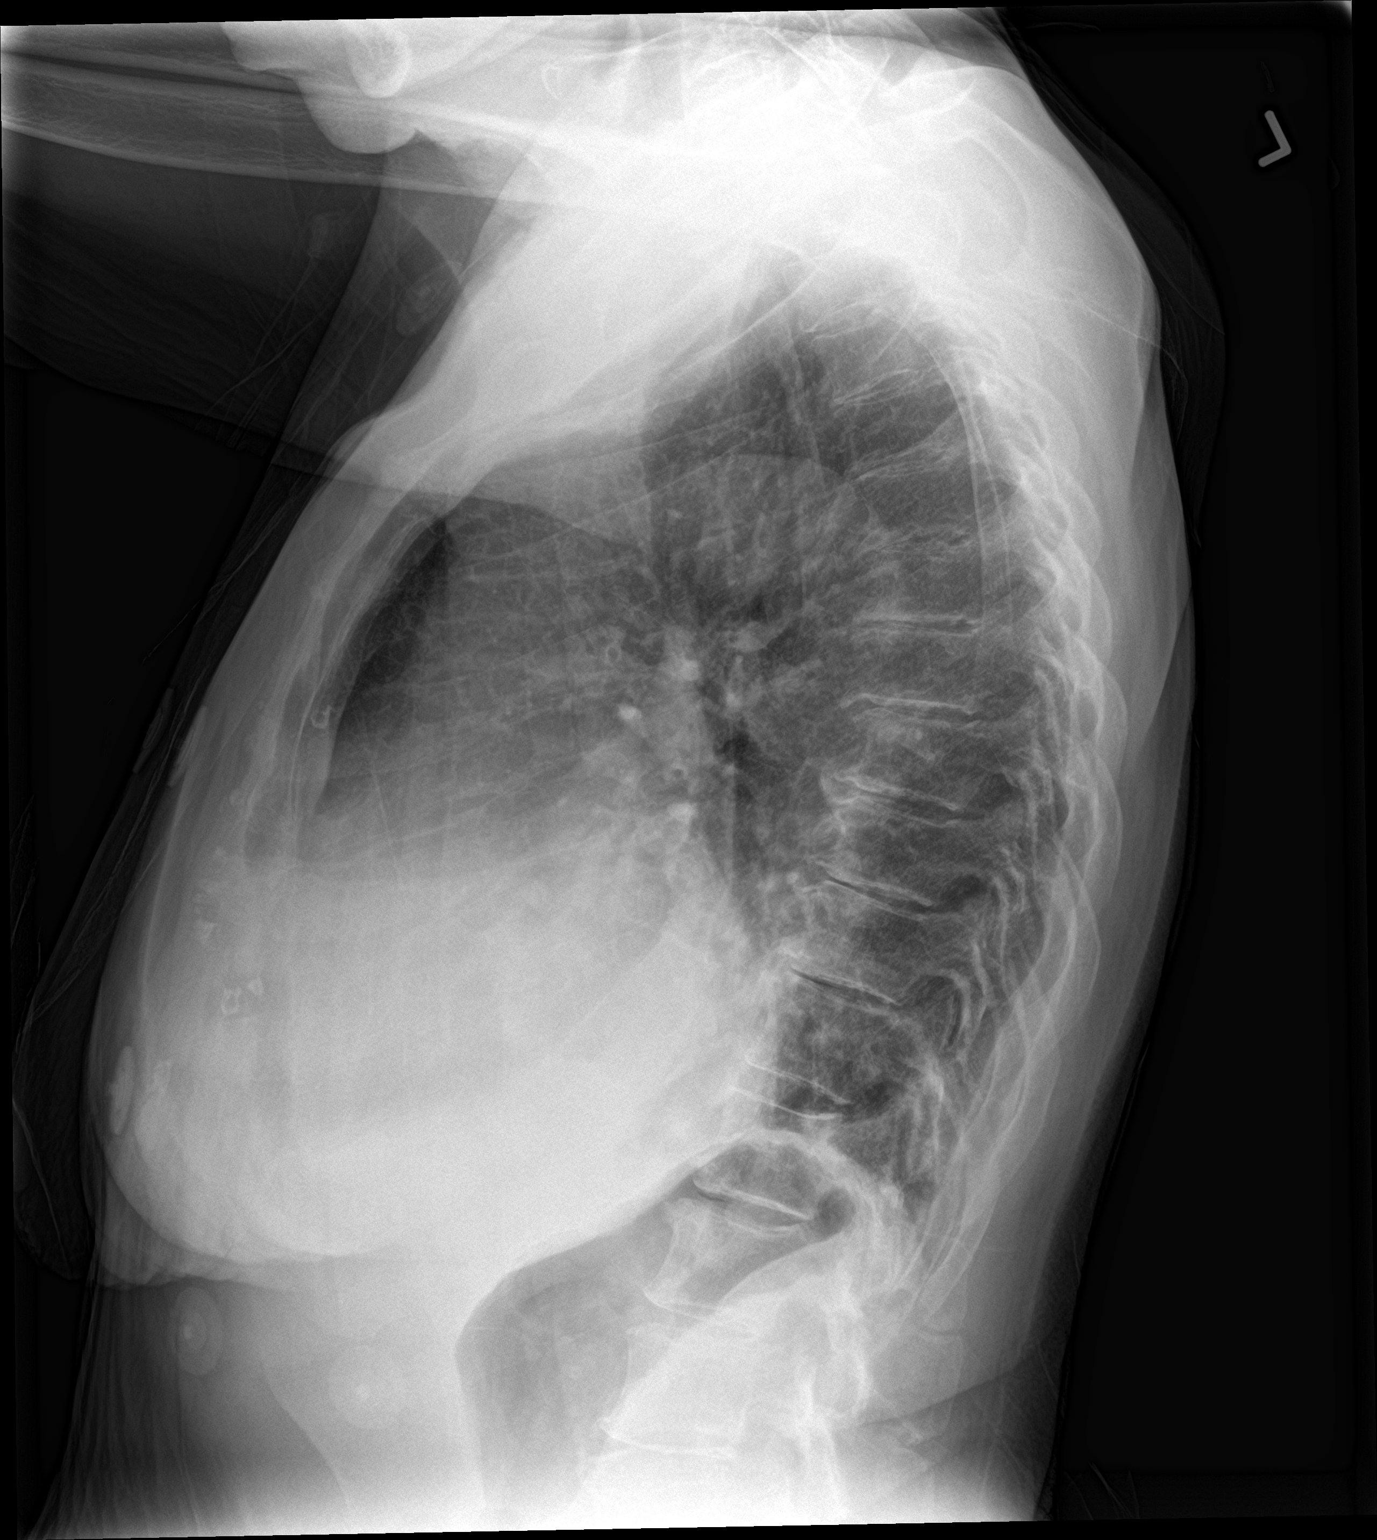

[2 of 2 positions shown; findings below may reference images not displayed]

FINDINGS: Frontal and lateral views of the chest demonstrate an enlarged
cardiac silhouette. There is patchy right-sided airspace disease,
primarily within the right upper and right lower lobes. Small right
effusion. No pneumothorax. No acute bony abnormalities.
IMPRESSION: 1. Patchy right airspace disease and small right pleural effusion,
consistent with bronchopneumonia or asymmetric edema.
2. Enlarged cardiac silhouette.

## 2021-12-31 IMAGING — DX DG CHEST 1V PORT
1 series · 1 of 1 positions shown · non-contrast
Comparison: Single-view of the chest that PA and lateral chest
10/18/2019. Single-view of the chest 02/23/2019.

CLINICAL DATA: Shortness of breath and productive cough.

EXAM:
PORTABLE CHEST 1 VIEW

[chest ap]
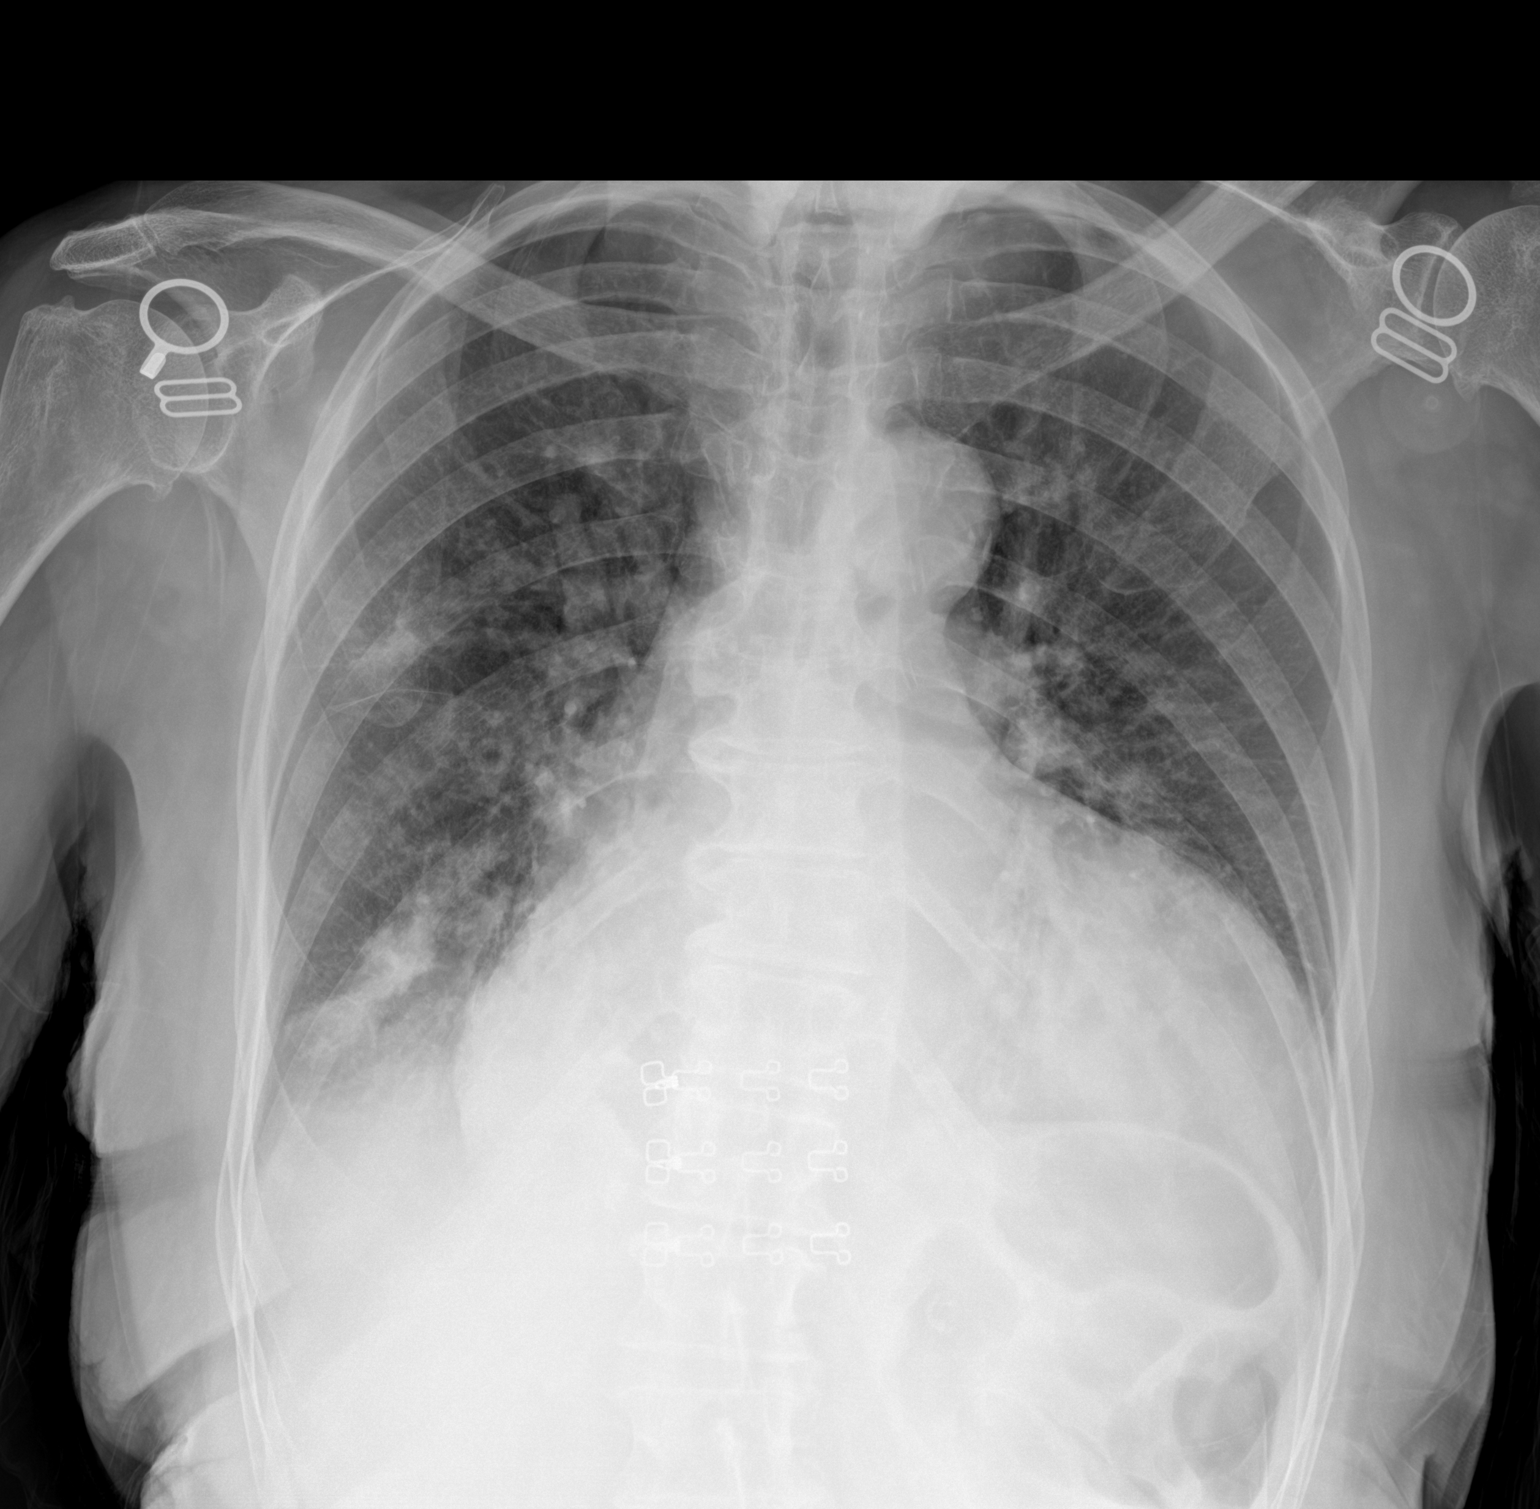

[1 of 1 positions shown; findings below may reference images not displayed]

FINDINGS: Patchy airspace disease is seen in the right mid lung and right lung
base. The left lung appears clear. Marked enlargement of the
cardiopericardial silhouette is unchanged. Aortic atherosclerosis is
noted. There is likely a small right pleural effusion. No acute bony
abnormality.
IMPRESSION: Patchy airspace disease in the right mid lung and right lung base is
worrisome for pneumonia. The patient also appears to have a small
right pleural effusion.

No change in marked enlargement of the cardiopericardial silhouette
compatible with cardiomegaly and or pericardial effusion.

Aortic Atherosclerosis ([id number]).

## 2022-07-30 IMAGING — DX DG CHEST 1V PORT
1 series · 1 of 1 positions shown · non-contrast
Comparison: 12/31/2019

CLINICAL DATA: Shortness of breath

EXAM:
PORTABLE CHEST 1 VIEW

[chest ap]
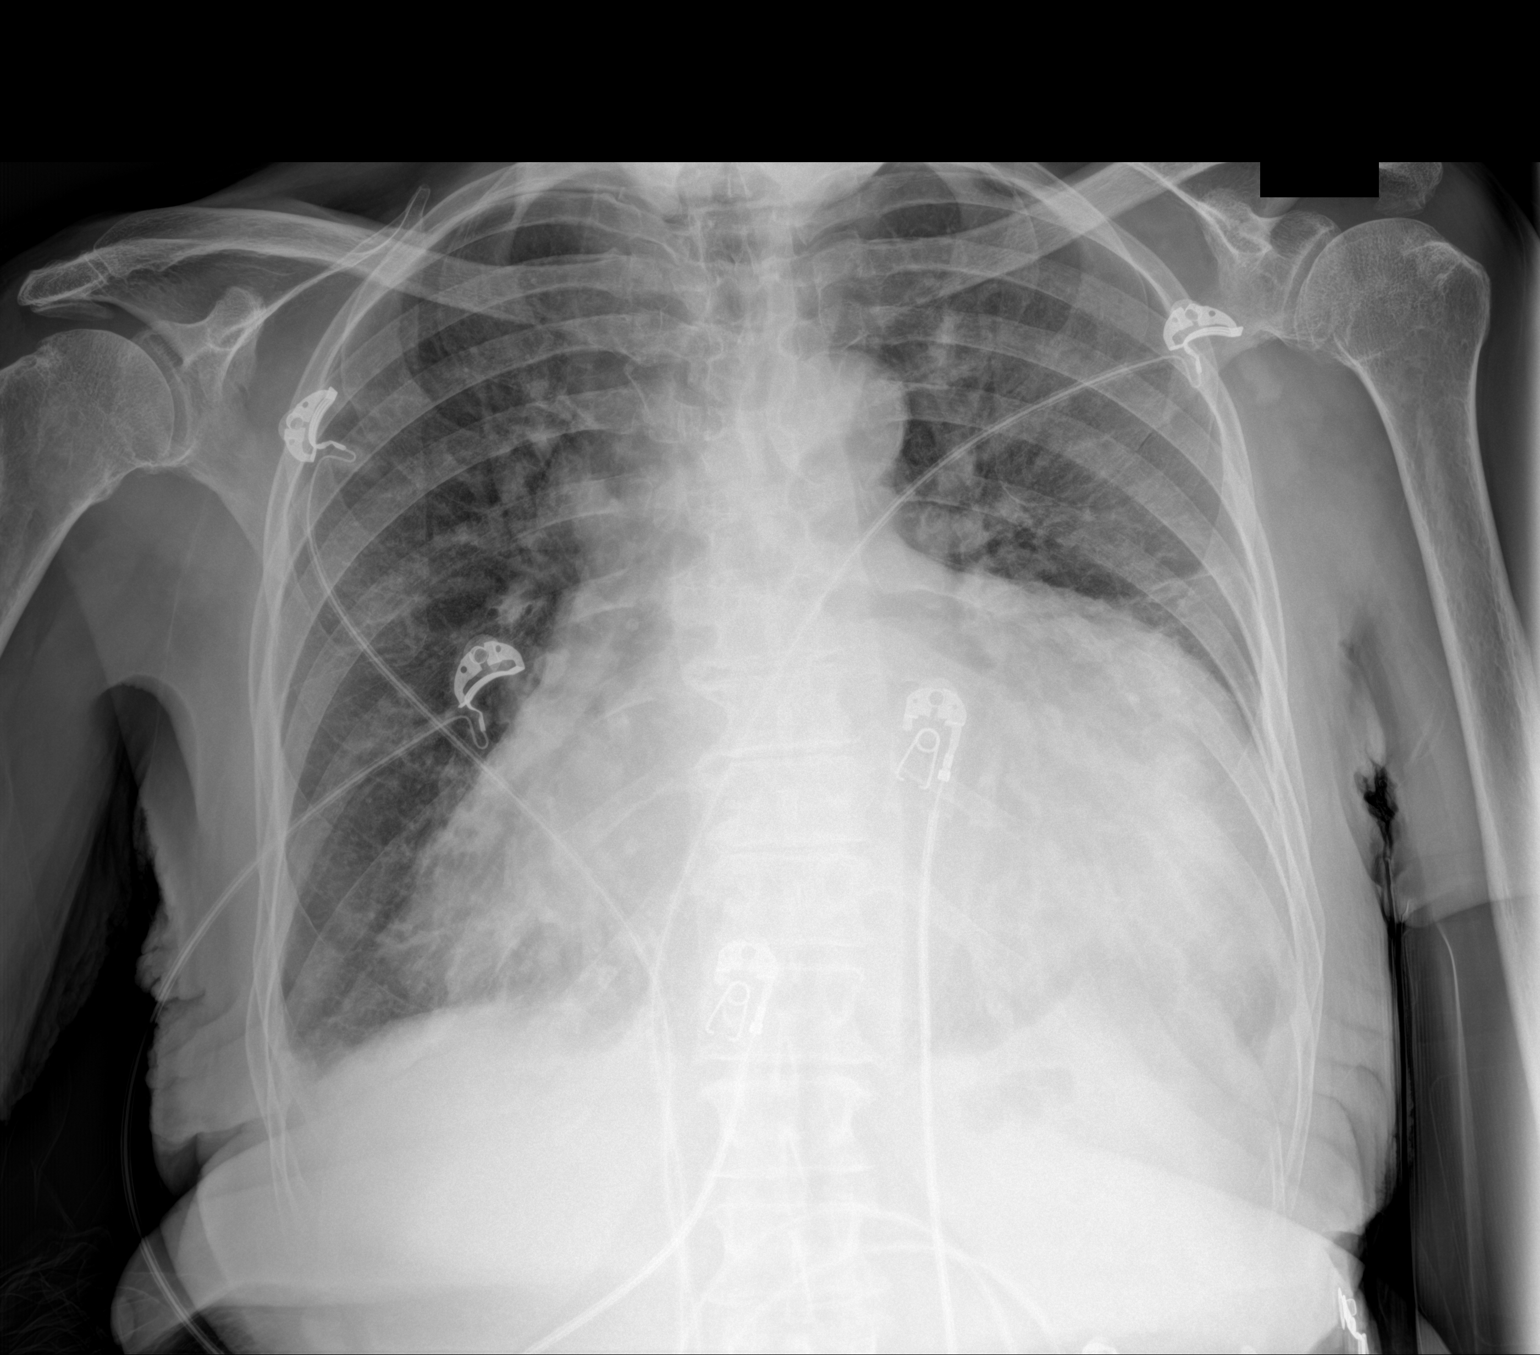

[1 of 1 positions shown; findings below may reference images not displayed]

FINDINGS: Cardiomegaly. No confluent airspace opacities. Small bilateral
effusions. Vascular congestion. No acute bony abnormality.
IMPRESSION: Cardiomegaly, vascular congestion.

Small bilateral effusions.

## 2023-07-10 IMAGING — DX DG CHEST 1V PORT
1 series · 1 of 1 positions shown · non-contrast
Comparison: 07/29/2020

CLINICAL DATA: Question sepsis

EXAM:
PORTABLE CHEST 1 VIEW

[chest ap]
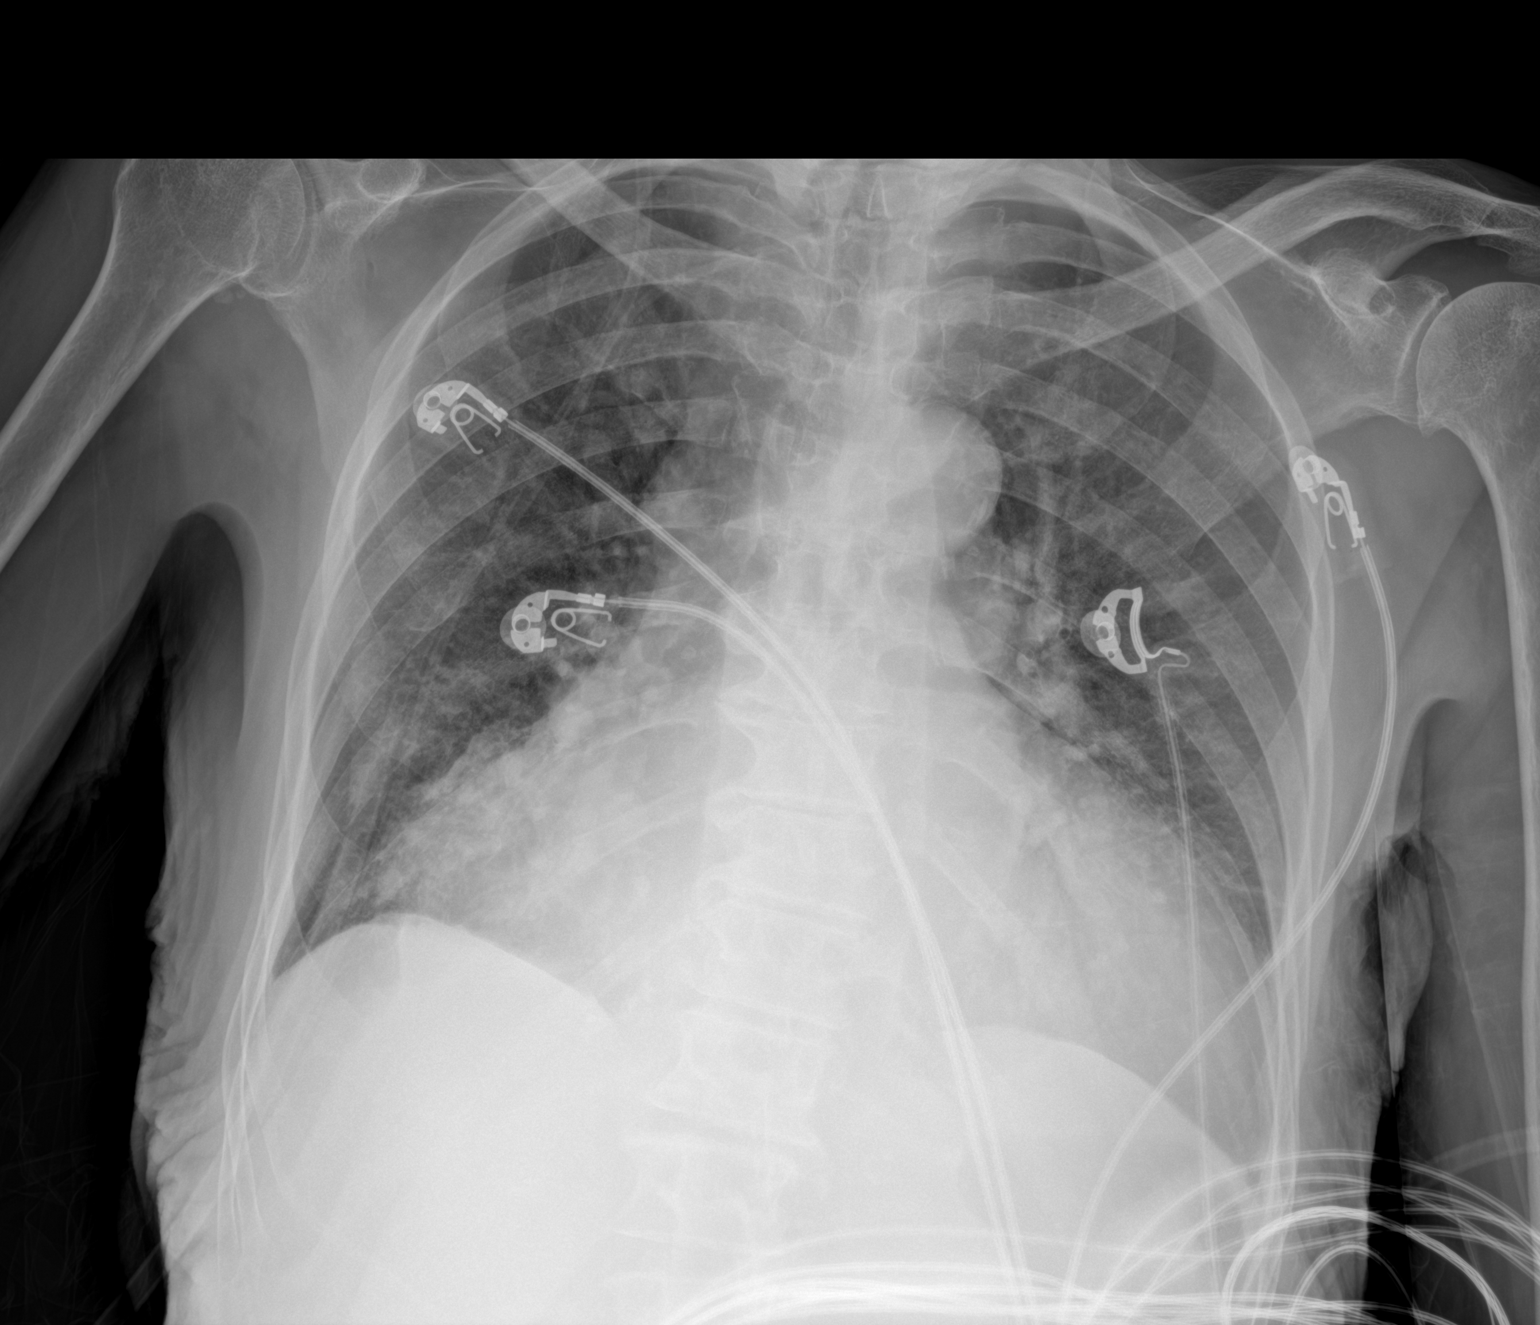

[1 of 1 positions shown; findings below may reference images not displayed]

FINDINGS: Cardiac silhouette is markedly enlarged. No change from comparison
exam.

Central venous congestion is mild. No pleural fluid. No
pneumothorax.
IMPRESSION: Marked cardiomegaly not changed from comparison exam. No acute
findings.
